# Patient Record
Sex: Female | Born: 1965 | Race: Black or African American | Hispanic: No | Marital: Single | Smoking: Current some day smoker
Health system: Southern US, Community
[De-identification: ages and names within clinical notes are randomized; demographics above are authoritative.]

## PROBLEM LIST (undated history)

## (undated) DIAGNOSIS — E109 Type 1 diabetes mellitus without complications: Secondary | ICD-10-CM

## (undated) DIAGNOSIS — N183 Chronic kidney disease, stage 3 unspecified: Secondary | ICD-10-CM

## (undated) DIAGNOSIS — I10 Essential (primary) hypertension: Secondary | ICD-10-CM

---

## 1998-01-10 ENCOUNTER — Encounter: Admission: RE | Admit: 1998-01-10 | Discharge: 1998-01-10 | Payer: Self-pay | Admitting: Internal Medicine

## 1998-02-13 ENCOUNTER — Encounter: Admission: RE | Admit: 1998-02-13 | Discharge: 1998-02-13 | Payer: Self-pay | Admitting: Internal Medicine

## 1999-07-23 ENCOUNTER — Other Ambulatory Visit: Admission: RE | Admit: 1999-07-23 | Discharge: 1999-07-23 | Payer: Self-pay | Admitting: Obstetrics and Gynecology

## 1999-10-16 ENCOUNTER — Encounter: Admission: RE | Admit: 1999-10-16 | Discharge: 2000-01-14 | Payer: Self-pay | Admitting: Obstetrics and Gynecology

## 1999-11-21 ENCOUNTER — Encounter: Admission: RE | Admit: 1999-11-21 | Discharge: 2000-02-19 | Payer: Self-pay | Admitting: Obstetrics and Gynecology

## 1999-12-27 ENCOUNTER — Inpatient Hospital Stay (HOSPITAL_COMMUNITY): Admission: AD | Admit: 1999-12-27 | Discharge: 1999-12-27 | Payer: Self-pay | Admitting: Obstetrics and Gynecology

## 2000-01-08 ENCOUNTER — Inpatient Hospital Stay (HOSPITAL_COMMUNITY): Admission: AD | Admit: 2000-01-08 | Discharge: 2000-01-11 | Payer: Self-pay | Admitting: Obstetrics and Gynecology

## 2000-01-08 ENCOUNTER — Encounter (INDEPENDENT_AMBULATORY_CARE_PROVIDER_SITE_OTHER): Payer: Self-pay

## 2000-02-13 ENCOUNTER — Other Ambulatory Visit: Admission: RE | Admit: 2000-02-13 | Discharge: 2000-02-13 | Payer: Self-pay | Admitting: Obstetrics and Gynecology

## 2006-09-21 ENCOUNTER — Inpatient Hospital Stay (HOSPITAL_COMMUNITY): Admission: AD | Admit: 2006-09-21 | Discharge: 2006-09-28 | Payer: Self-pay | Admitting: Internal Medicine

## 2006-09-21 ENCOUNTER — Emergency Department (HOSPITAL_COMMUNITY): Admission: EM | Admit: 2006-09-21 | Discharge: 2006-09-21 | Payer: Self-pay | Admitting: Family Medicine

## 2006-09-30 ENCOUNTER — Ambulatory Visit: Payer: Self-pay | Admitting: Internal Medicine

## 2006-10-01 ENCOUNTER — Ambulatory Visit: Payer: Self-pay | Admitting: *Deleted

## 2006-10-09 ENCOUNTER — Encounter: Admission: RE | Admit: 2006-10-09 | Discharge: 2007-01-07 | Payer: Self-pay | Admitting: *Deleted

## 2006-10-28 ENCOUNTER — Ambulatory Visit: Payer: Self-pay | Admitting: Internal Medicine

## 2006-11-27 ENCOUNTER — Ambulatory Visit: Payer: Self-pay | Admitting: Internal Medicine

## 2007-01-19 ENCOUNTER — Ambulatory Visit: Payer: Self-pay | Admitting: Internal Medicine

## 2007-06-26 ENCOUNTER — Ambulatory Visit (HOSPITAL_COMMUNITY): Admission: RE | Admit: 2007-06-26 | Discharge: 2007-06-26 | Payer: Self-pay | Admitting: Family Medicine

## 2007-07-24 ENCOUNTER — Ambulatory Visit: Payer: Self-pay | Admitting: Internal Medicine

## 2007-08-11 ENCOUNTER — Ambulatory Visit: Payer: Self-pay | Admitting: Internal Medicine

## 2008-05-31 ENCOUNTER — Ambulatory Visit: Payer: Self-pay | Admitting: Family Medicine

## 2008-05-31 ENCOUNTER — Encounter (INDEPENDENT_AMBULATORY_CARE_PROVIDER_SITE_OTHER): Payer: Self-pay | Admitting: Internal Medicine

## 2008-05-31 LAB — CONVERTED CEMR LAB
ALT: 28 units/L (ref 0–35)
Basophils Absolute: 0.1 10*3/uL (ref 0.0–0.1)
Basophils Relative: 2 % — ABNORMAL HIGH (ref 0–1)
CO2: 23 meq/L (ref 19–32)
Cholesterol: 181 mg/dL (ref 0–200)
Creatinine, Ser: 0.99 mg/dL (ref 0.40–1.20)
Eosinophils Relative: 2 % (ref 0–5)
HCT: 41.3 % (ref 36.0–46.0)
Hemoglobin: 12.9 g/dL (ref 12.0–15.0)
Lymphocytes Relative: 40 % (ref 12–46)
MCHC: 31.2 g/dL (ref 30.0–36.0)
MCV: 81.1 fL (ref 78.0–100.0)
Monocytes Absolute: 0.4 10*3/uL (ref 0.1–1.0)
RDW: 14.9 % (ref 11.5–15.5)
Total Bilirubin: 0.5 mg/dL (ref 0.3–1.2)
Total CHOL/HDL Ratio: 3.2
VLDL: 14 mg/dL (ref 0–40)

## 2008-06-03 ENCOUNTER — Ambulatory Visit: Payer: Self-pay | Admitting: Internal Medicine

## 2008-06-14 ENCOUNTER — Ambulatory Visit: Payer: Self-pay | Admitting: Internal Medicine

## 2008-06-27 ENCOUNTER — Ambulatory Visit (HOSPITAL_COMMUNITY): Admission: RE | Admit: 2008-06-27 | Discharge: 2008-06-27 | Payer: Self-pay | Admitting: Family Medicine

## 2008-06-28 ENCOUNTER — Ambulatory Visit: Payer: Self-pay | Admitting: Internal Medicine

## 2008-08-16 ENCOUNTER — Ambulatory Visit: Payer: Self-pay | Admitting: Internal Medicine

## 2008-10-31 ENCOUNTER — Ambulatory Visit: Payer: Self-pay | Admitting: Family Medicine

## 2008-11-30 ENCOUNTER — Ambulatory Visit: Payer: Self-pay | Admitting: Internal Medicine

## 2008-11-30 ENCOUNTER — Encounter (INDEPENDENT_AMBULATORY_CARE_PROVIDER_SITE_OTHER): Payer: Self-pay | Admitting: Family Medicine

## 2008-11-30 LAB — CONVERTED CEMR LAB
AST: 13 units/L (ref 0–37)
Albumin: 4.3 g/dL (ref 3.5–5.2)
Alkaline Phosphatase: 76 units/L (ref 39–117)
BUN: 15 mg/dL (ref 6–23)
Creatinine, Ser: 0.92 mg/dL (ref 0.40–1.20)
HDL: 47 mg/dL (ref >39)
LDL Cholesterol: 98 mg/dL (ref 0–99)
Microalb, Ur: 0.5 mg/dL (ref 0.00–1.89)
Potassium: 3.6 meq/L (ref 3.5–5.3)
Total Bilirubin: 0.3 mg/dL (ref 0.3–1.2)
Total CHOL/HDL Ratio: 3.3
VLDL: 10 mg/dL (ref 0–40)

## 2009-07-11 ENCOUNTER — Ambulatory Visit (HOSPITAL_COMMUNITY): Admission: RE | Admit: 2009-07-11 | Discharge: 2009-07-11 | Payer: Self-pay | Admitting: Family Medicine

## 2009-08-08 ENCOUNTER — Encounter (INDEPENDENT_AMBULATORY_CARE_PROVIDER_SITE_OTHER): Payer: Self-pay | Admitting: Adult Health

## 2009-08-08 ENCOUNTER — Ambulatory Visit: Payer: Self-pay | Admitting: Internal Medicine

## 2009-08-08 LAB — CONVERTED CEMR LAB
ALT: 16 units/L (ref 0–35)
AST: 12 units/L (ref 0–37)
Creatinine, Ser: 1.04 mg/dL (ref 0.40–1.20)
HDL: 43 mg/dL (ref >39)
Total Bilirubin: 0.3 mg/dL (ref 0.3–1.2)
Total CHOL/HDL Ratio: 3.7
VLDL: 21 mg/dL (ref 0–40)

## 2009-11-08 ENCOUNTER — Ambulatory Visit: Payer: Self-pay | Admitting: Internal Medicine

## 2010-07-12 ENCOUNTER — Ambulatory Visit (HOSPITAL_COMMUNITY): Admission: RE | Admit: 2010-07-12 | Discharge: 2010-07-12 | Payer: Self-pay | Admitting: Internal Medicine

## 2011-01-26 ENCOUNTER — Other Ambulatory Visit: Payer: Self-pay | Admitting: Internal Medicine

## 2011-02-01 NOTE — Discharge Summary (Signed)
Stephanie Huber, Stephanie Huber NO.:  [id number]   MEDICAL RECORD NO.:  [id number]          PATIENT TYPE:  INP   LOCATION:  5708                         FACILITY:  [facility]   PHYSICIAN:  Lonia Blood, M.D.DATE OF BIRTH:  1966/02/07   DATE OF ADMISSION:  09/21/2006  DATE OF DISCHARGE:  09/28/2006                         DISCHARGE SUMMARY - REFERRING   PRIMARY CARE PHYSICIAN:  [facility].   DISCHARGE DIAGNOSES:  1. Newly diagnosed diabetes mellitus type 2.      a.     Presenting in diabetic ketoacidosis.      b.     Extensive diabetic education initiated.      c.     Initiation of medical therapy begun.  2. Hypertension.   DISCHARGE MEDICATIONS:  1. Lisinopril 5 mg daily.  2. Glyburide 5 mg b.i.d.  3. Glucophage 500 mg b.i.d.  4. Lantus insulin 30 units q.h.s.  5. Aspirin 81 mg daily.   FOLLOWUP:  The patient will followup at her scheduled appointment at  [facility] on September 30, 2006 at 12:00 noon. The absolute necessity  of frequent followup appointments has been impressed upon Ms. Luisa Hart.  AT that time, the patient's CBG log will need to be checked and  consideration will need to be made to further titrating her diabetes  medications.   CONSULTATIONS:  None.   PROCEDURE:  None.   HISTORY OF PRESENT ILLNESS:  For details concerning the patient's  presentation, please see dictated history and physical by Dr. Hannah Beat dated September 21, 2006.   Stephanie Huber is a pleasant 45 year old female with no significant  prior medical history, short of hypertension, who presented to the  hospital on September 21, 2006 with complaints of persistent nausea and  vomiting. Emergency room evaluation revealed that the patient was  suffering with diabetic ketoacidosis with a bicarbonate level of 7. She  was placed on a Glucommander protocol and admitted to the acute unit at  [facility]. With insulin drip via the Glucommander protocol,  the pateint's DKA  resolved. The extensive process of diabetic education  was then initiated. Diet counseling was carried out. Explantation of the  pathogenesis of diabetes and the need for strict control was impressed  upon the patient. Extensive education was carried out to ensure the  patient was competent to administer herself insulin and check her CBG's.  Case management consultation was carried out to assure that the patient  could obtain her medications and would have reliable outpatient  followup. At such time the patient's CBG's became more stable and it was  felt that the patient was adequately educated, she was cleared to  discharge home. Followup issues are as noted above.   No other significant complications were encountered during the  hospitalization. The patient did have a self-limited episode of diarrhea  but tested negative for C. diff colitis. She also had significant  hypophosphatemia associated with her DKA but as would be expected, this  resolved with resolution of her DKA. It should be noted that a  urinalysis obtained at the day of the patient's presentation did reveal  large blood and greater than 300 protein. Urine microscopy and micro was  not obtained, so it is not clear if this was actually myoglobin or  hemoglobin. At the time of discharge, there was no difficulty with gross  hematuria. Followup outpatient urinalysils is recommended.      Lonia Blood, M.D.  Electronically Signed     JTM/MEDQ  D:  09/27/2006  T:  09/27/2006  Job:  [id number]   cc:   [facility]

## 2011-02-01 NOTE — H&P (Signed)
NAMEVALLORY, OETKEN NO.:  [id number]   MEDICAL RECORD NO.:  [id number]          PATIENT TYPE:  INP   LOCATION:  3311                         FACILITY:  [facility]   PHYSICIAN:  Hettie Holstein, D.O.    DATE OF BIRTH:  1966/03/28   DATE OF ADMISSION:  09/21/2006  DATE OF DISCHARGE:                              HISTORY & PHYSICAL   PRIMARY CARE PHYSICIAN:  Unassigned.   CHIEF COMPLAINT:  Persistent nausea and vomiting.   HISTORY OF PRESENT ILLNESS:  Stephanie Huber is a pleasant 45 year old  African-American female with no known history of diabetes, who had been  in her usual state of health up until the day after her Christmas, when  she started developing vomiting, increased thirst and polyuria.  She  stated that she tried to wait this out by increasing her fluid intake,  but this had progressed.  Ultimately, she presented to the urgent care  center.  At which time, she was discovered to be quite acidotic with a  pH of 7.18 venous and a bicarb of 7.3.  She was tachycardic, tachypneic  and had exhibited some mental status changes as per staff.  In any  event, she was admitted directly to the stepdown unit from the urgent  care center.  At which time, she was probably initiated on IV fluid  boluses.  She had received almost a liter at the urgent care center and  at present, she is initiating lab work and [employer].   PAST MEDICAL HISTORY:  Significant for hypertension. She has no known  surgical history.   MEDICATIONS:  She takes potassium supplements and HCTZ at home as well  as Depo shots.   SOCIAL HISTORY:  She denies alcohol or tobacco.  She is married with one  49-year-old child.  She works in [employer].   FAMILY HISTORY:  Her mother is alive at age 62.  No known medical  illnesses that she is aware of.  Her father, she can provide his  history.   REVIEW OF SYSTEMS:  She had been in her usual state of health up until  around Christmas, when she  developed some nausea, vomiting, polyuria,  polydipsia.  She denies any hematemesis or hematochezia.  She denies  fevers or chills.  She denies chest pain or shortness of breath.  She  denies vaginal discharge.  In addition, she denies any lower extremity  pain or edema.  Further review of systems is unremarkable.   Prior to arrival, her labs were as noted above.  Creatinine of 0.9.  The  I-STAT revealed pH venous of 7.187, PCO2 of 19, bicarb of 7.3.  Urinalysis revealed protein as well as small number of leukocytes and  some blood.  Her urine pH was 5.5 and was quite concentrated with a  specific gravity of 1.03.  Her CBC revealed WBC of 12.8, hemoglobin of  14.2 and a platelet count of 335 as well as an MCV of 80.2.   ASSESSMENT:  1. Diabetic ketoacidosis.  2. Newly diagnosed diabetes.  3. History of hypertension on antihypertensive medications at  baseline.  4. Leukocytosis.  5. Gross hematuria.   PLAN:  At this time, we are going to admit Stephanie Huber to the stepdown  unit.  Initiate generous volume repletion and D5 normal saline and  Glucommander until anion gap closure.  We will follow her clinical  course and ultimately titrate off D5 normal saline once her gap closes  and determine a basal insulin requirement for her and initiate this with  Lantus and diabetes education. We will follow her clinical course.  We  will check a urinalysis as well to assure that she does not have an  underlying urinary tract infection and get a baseline EKG as well as  chest x-ray.      Hettie Holstein, D.O.  Electronically Signed     ESS/MEDQ  D:  09/21/2006  T:  09/22/2006  Job:  [id number]   cc:   Elvina Sidle, M.D.

## 2011-04-23 ENCOUNTER — Other Ambulatory Visit (HOSPITAL_COMMUNITY): Payer: Self-pay | Admitting: Internal Medicine

## 2011-04-23 DIAGNOSIS — Z1231 Encounter for screening mammogram for malignant neoplasm of breast: Secondary | ICD-10-CM

## 2011-07-15 ENCOUNTER — Ambulatory Visit (HOSPITAL_COMMUNITY): Payer: Medicare Other

## 2011-08-09 ENCOUNTER — Ambulatory Visit (HOSPITAL_COMMUNITY)
Admission: RE | Admit: 2011-08-09 | Discharge: 2011-08-09 | Disposition: A | Discharge status: Home / Self Care | Payer: Medicare Other | Source: Ambulatory Visit | Attending: Internal Medicine | Admitting: Internal Medicine

## 2011-08-09 DIAGNOSIS — Z1231 Encounter for screening mammogram for malignant neoplasm of breast: Secondary | ICD-10-CM | POA: Insufficient documentation

## 2012-06-08 ENCOUNTER — Other Ambulatory Visit (HOSPITAL_COMMUNITY): Payer: Self-pay | Admitting: Internal Medicine

## 2012-06-08 DIAGNOSIS — Z1231 Encounter for screening mammogram for malignant neoplasm of breast: Secondary | ICD-10-CM

## 2012-08-10 ENCOUNTER — Ambulatory Visit (HOSPITAL_COMMUNITY)
Admission: RE | Admit: 2012-08-10 | Discharge: 2012-08-10 | Disposition: A | Discharge status: Home / Self Care | Payer: Medicare Other | Source: Ambulatory Visit | Attending: Internal Medicine | Admitting: Internal Medicine

## 2012-08-10 DIAGNOSIS — Z1231 Encounter for screening mammogram for malignant neoplasm of breast: Secondary | ICD-10-CM | POA: Insufficient documentation

## 2013-07-06 ENCOUNTER — Other Ambulatory Visit (HOSPITAL_COMMUNITY): Payer: Self-pay | Admitting: Internal Medicine

## 2013-07-06 DIAGNOSIS — Z1231 Encounter for screening mammogram for malignant neoplasm of breast: Secondary | ICD-10-CM

## 2013-08-11 ENCOUNTER — Ambulatory Visit (HOSPITAL_COMMUNITY)
Admission: RE | Admit: 2013-08-11 | Discharge: 2013-08-11 | Disposition: A | Discharge status: Home / Self Care | Payer: Medicare Other | Source: Ambulatory Visit | Attending: Internal Medicine | Admitting: Internal Medicine

## 2013-08-11 DIAGNOSIS — Z1231 Encounter for screening mammogram for malignant neoplasm of breast: Secondary | ICD-10-CM

## 2014-04-27 ENCOUNTER — Emergency Department (INDEPENDENT_AMBULATORY_CARE_PROVIDER_SITE_OTHER)
Admission: EM | Admit: 2014-04-27 | Discharge: 2014-04-27 | Disposition: A | Discharge status: Home / Self Care | Payer: Medicare Other | Source: Home / Self Care | Attending: Family Medicine | Admitting: Family Medicine

## 2014-04-27 ENCOUNTER — Encounter (HOSPITAL_COMMUNITY): Payer: Self-pay | Admitting: Emergency Medicine

## 2014-04-27 DIAGNOSIS — R000 Tachycardia, unspecified: Secondary | ICD-10-CM

## 2014-04-27 NOTE — ED Notes (Signed)
Pt  [facility]  [facility]  [facility]  [facility]  That   Her pulse  Was  Elevated    And  That  She  Needed  To  Be  evaulated     Pt  denys      Any  Physical      Symptoms    Or  Any       Complaints         She  Is  Alert  And  Oriented  And  Appears  In no  Acute  Distress

## 2014-04-27 NOTE — ED Provider Notes (Signed)
Aldean JewettKeeshia D Barsch is a 48 y.o. female who presents to Urgent Care today for tachycardia. Patient was found to have an elevated heart rate of 109 beats per minute at the plasma center today while attempting to donate. She was advised to present to urgent care for evaluation. She feels completely well with no chest pain palpitations shortness of breath weakness or numbness. She denies plasma twice per week and drinks plenty of fluids. She feels well otherwise.   History reviewed. No pertinent past medical history. History  Substance Use Topics  . Smoking status: Never Smoker   . Smokeless tobacco: Not on file  . Alcohol Use: No   ROS as above Medications: No current facility-administered medications for this encounter.   No current outpatient prescriptions on file.    Exam:  BP 145/88  Pulse 97  Temp(Src) 98.3 F (36.8 C) (Oral)  Resp 18  SpO2 100%  LMP 03/27/2014 Gen: Well NAD HEENT: EOMI,  MMM Lungs: Normal work of breathing. CTABL Heart: RRR no MRG Abd: NABS, Soft. Nondistended, Nontender Exts: Brisk capillary refill, warm and well perfused.   No results found for this or any previous visit (from the past 24 hour(s)). No results found.  Assessment and Plan: 48 y.o. female with resolved tachycardia. Patient appears to be well. Recommend followup with primary care provider. Come back as needed.  Discussed warning signs or symptoms. Please see discharge instructions. Patient expresses understanding.   This note was created using Conservation officer, historic buildingsDragon voice recognition software. Any transcription errors are unintended.    Rodolph BongEvan S Corey, MD 04/27/14 [phone]

## 2014-04-27 NOTE — Discharge Instructions (Signed)
Thank you for coming in today. Followup with your Dr..  Drink plenty of fluids Come back as needed Call or go to the emergency room if you get worse, have trouble breathing, have chest pains, or palpitations.   Nonspecific Tachycardia Tachycardia is a faster than normal heartbeat (more than 100 beats per minute). In adults, the heart normally beats between 60 and 100 times a minute. A fast heartbeat may be a normal response to exercise or stress. It does not necessarily mean that something is wrong. However, sometimes when your heart beats too fast it may not be able to pump enough blood to the rest of your body. This can result in chest pain, shortness of breath, dizziness, and even fainting. Nonspecific tachycardia means that the specific cause or pattern of your tachycardia is unknown. CAUSES  Tachycardia may be harmless or it may be due to a more serious underlying cause. Possible causes of tachycardia include:  Exercise or exertion.  Fever.  Pain or injury.  Infection.  Loss of body fluids (dehydration).  Overactive thyroid.  Lack of red blood cells (anemia).  Anxiety and stress.  Alcohol.  Caffeine.  Tobacco products.  Diet pills.  Illegal drugs.  Heart disease. SYMPTOMS  Rapid or irregular heartbeat (palpitations).  Suddenly feeling your heart beating (cardiac awareness).  Dizziness.  Tiredness (fatigue).  Shortness of breath.  Chest pain.  Nausea.  Fainting. DIAGNOSIS  Your caregiver will perform a physical exam and take your medical history. In some cases, a heart specialist (cardiologist) may be consulted. Your caregiver may also order:  Blood tests.  Electrocardiography. This test records the electrical activity of your heart.  A heart monitoring test. TREATMENT  Treatment will depend on the likely cause of your tachycardia. The goal is to treat the underlying cause of your tachycardia. Treatment methods may include:  Replacement of fluids  or blood through an intravenous (IV) tube for moderate to severe dehydration or anemia.  New medicines or changes in your current medicines.  Diet and lifestyle changes.  Treatment for certain infections.  Stress relief or relaxation methods. HOME CARE INSTRUCTIONS   Rest.  Drink enough fluids to keep your urine clear or pale yellow.  Do not smoke.  Avoid:  Caffeine.  Tobacco.  Alcohol.  Chocolate.  Stimulants such as over-the-counter diet pills or pills that help you stay awake.  Situations that cause anxiety or stress.  Illegal drugs such as marijuana, phencyclidine (PCP), and cocaine.  Only take medicine as directed by your caregiver.  Keep all follow-up appointments as directed by your caregiver. SEEK IMMEDIATE MEDICAL CARE IF:   You have pain in your chest, upper arms, jaw, or neck.  You become weak, dizzy, or feel faint.  You have palpitations that will not go away.  You vomit, have diarrhea, or pass blood in your stool.  Your skin is cool, pale, and wet.  You have a fever that will not go away with rest, fluids, and medicine. MAKE SURE YOU:   Understand these instructions.  Will watch your condition.  Will get help right away if you are not doing well or get worse. Document Released: 10/10/2004 Document Revised: 11/25/2011 Document Reviewed: 08/13/2011 [facility] Patient Information 2015 [place], [place]. This information is not intended to replace advice given to you by your health care provider. Make sure you discuss any questions you have with your health care provider.

## 2014-06-21 ENCOUNTER — Other Ambulatory Visit (HOSPITAL_COMMUNITY): Payer: Self-pay | Admitting: Internal Medicine

## 2014-06-21 DIAGNOSIS — Z1231 Encounter for screening mammogram for malignant neoplasm of breast: Secondary | ICD-10-CM

## 2014-08-16 ENCOUNTER — Ambulatory Visit (HOSPITAL_COMMUNITY)
Admission: RE | Admit: 2014-08-16 | Discharge: 2014-08-16 | Disposition: A | Discharge status: Home / Self Care | Payer: Medicare Other | Source: Ambulatory Visit | Attending: Internal Medicine | Admitting: Internal Medicine

## 2014-08-16 DIAGNOSIS — Z1231 Encounter for screening mammogram for malignant neoplasm of breast: Secondary | ICD-10-CM

## 2015-02-18 ENCOUNTER — Encounter (HOSPITAL_COMMUNITY): Payer: Self-pay | Admitting: *Deleted

## 2015-02-18 ENCOUNTER — Inpatient Hospital Stay (HOSPITAL_COMMUNITY): Payer: Medicare Other

## 2015-02-18 ENCOUNTER — Inpatient Hospital Stay (HOSPITAL_COMMUNITY)
Admission: EM | Admit: 2015-02-18 | Discharge: 2015-02-21 | DRG: 638 | Disposition: A | Discharge status: Home / Self Care | Payer: Medicare Other | Attending: Internal Medicine | Admitting: Internal Medicine

## 2015-02-18 DIAGNOSIS — E1110 Type 2 diabetes mellitus with ketoacidosis without coma: Secondary | ICD-10-CM | POA: Diagnosis present

## 2015-02-18 DIAGNOSIS — E872 Acidosis, unspecified: Secondary | ICD-10-CM | POA: Diagnosis present

## 2015-02-18 DIAGNOSIS — N390 Urinary tract infection, site not specified: Secondary | ICD-10-CM

## 2015-02-18 DIAGNOSIS — E871 Hypo-osmolality and hyponatremia: Secondary | ICD-10-CM | POA: Diagnosis present

## 2015-02-18 DIAGNOSIS — E860 Dehydration: Secondary | ICD-10-CM | POA: Diagnosis present

## 2015-02-18 DIAGNOSIS — E861 Hypovolemia: Secondary | ICD-10-CM | POA: Diagnosis present

## 2015-02-18 DIAGNOSIS — Z794 Long term (current) use of insulin: Secondary | ICD-10-CM | POA: Diagnosis not present

## 2015-02-18 DIAGNOSIS — N179 Acute kidney failure, unspecified: Secondary | ICD-10-CM | POA: Diagnosis present

## 2015-02-18 DIAGNOSIS — Z9119 Patient's noncompliance with other medical treatment and regimen: Secondary | ICD-10-CM | POA: Diagnosis present

## 2015-02-18 DIAGNOSIS — E1310 Other specified diabetes mellitus with ketoacidosis without coma: Secondary | ICD-10-CM | POA: Diagnosis not present

## 2015-02-18 DIAGNOSIS — E1010 Type 1 diabetes mellitus with ketoacidosis without coma: Secondary | ICD-10-CM | POA: Diagnosis not present

## 2015-02-18 DIAGNOSIS — E875 Hyperkalemia: Secondary | ICD-10-CM | POA: Diagnosis present

## 2015-02-18 DIAGNOSIS — B3749 Other urogenital candidiasis: Secondary | ICD-10-CM | POA: Diagnosis present

## 2015-02-18 DIAGNOSIS — R4182 Altered mental status, unspecified: Secondary | ICD-10-CM | POA: Diagnosis not present

## 2015-02-18 LAB — BASIC METABOLIC PANEL
ANION GAP: 7 (ref 5–15)
Anion gap: 17 — ABNORMAL HIGH (ref 5–15)
Anion gap: 11 (ref 5–15)
BUN: 51 mg/dL — AB (ref 6–20)
BUN: 48 mg/dL — ABNORMAL HIGH (ref 6–20)
BUN: 42 mg/dL — AB (ref 6–20)
CALCIUM: 8.2 mg/dL — AB (ref 8.9–10.3)
CALCIUM: 8.5 mg/dL — AB (ref 8.9–10.3)
CHLORIDE: 101 mmol/L (ref 101–111)
CO2: 20 mmol/L — ABNORMAL LOW (ref 22–32)
CO2: 25 mmol/L (ref 22–32)
CO2: 25 mmol/L (ref 22–32)
CREATININE: 3.77 mg/dL — AB (ref 0.44–1.00)
Calcium: 8.4 mg/dL — ABNORMAL LOW (ref 8.9–10.3)
Chloride: 96 mmol/L — ABNORMAL LOW (ref 101–111)
Chloride: 100 mmol/L — ABNORMAL LOW (ref 101–111)
Creatinine, Ser: 3.27 mg/dL — ABNORMAL HIGH (ref 0.44–1.00)
Creatinine, Ser: 3.03 mg/dL — ABNORMAL HIGH (ref 0.44–1.00)
GFR calc non Af Amer: 17 mL/min — ABNORMAL LOW (ref >60)
GFR, EST AFRICAN AMERICAN: 15 mL/min — AB (ref >60)
GFR, EST AFRICAN AMERICAN: 18 mL/min — AB (ref >60)
GFR, EST AFRICAN AMERICAN: 20 mL/min — AB (ref >60)
GFR, EST NON AFRICAN AMERICAN: 13 mL/min — AB (ref >60)
GFR, EST NON AFRICAN AMERICAN: 16 mL/min — AB (ref >60)
GLUCOSE: 206 mg/dL — AB (ref 65–99)
GLUCOSE: 132 mg/dL — AB (ref 65–99)
Glucose, Bld: 409 mg/dL — ABNORMAL HIGH (ref 65–99)
POTASSIUM: 3.8 mmol/L (ref 3.5–5.1)
POTASSIUM: 3.8 mmol/L (ref 3.5–5.1)
Potassium: 3.3 mmol/L — ABNORMAL LOW (ref 3.5–5.1)
SODIUM: 136 mmol/L (ref 135–145)
Sodium: 133 mmol/L — ABNORMAL LOW (ref 135–145)
Sodium: 133 mmol/L — ABNORMAL LOW (ref 135–145)

## 2015-02-18 LAB — COMPREHENSIVE METABOLIC PANEL
ALBUMIN: 3.8 g/dL (ref 3.5–5.0)
ALK PHOS: 122 U/L (ref 38–126)
ALT: 21 U/L (ref 14–54)
AST: 14 U/L — ABNORMAL LOW (ref 15–41)
Anion gap: 31 — ABNORMAL HIGH (ref 5–15)
BUN: 65 mg/dL — AB (ref 6–20)
CALCIUM: 9 mg/dL (ref 8.9–10.3)
CO2: 12 mmol/L — AB (ref 22–32)
CREATININE: 5.33 mg/dL — AB (ref 0.44–1.00)
Chloride: 73 mmol/L — ABNORMAL LOW (ref 101–111)
GFR calc Af Amer: 10 mL/min — ABNORMAL LOW (ref >60)
GFR, EST NON AFRICAN AMERICAN: 9 mL/min — AB (ref >60)
GLUCOSE: 1089 mg/dL — AB (ref 65–99)
Potassium: 5.6 mmol/L — ABNORMAL HIGH (ref 3.5–5.1)
Sodium: 116 mmol/L — CL (ref 135–145)
TOTAL PROTEIN: 8.3 g/dL — AB (ref 6.5–8.1)
Total Bilirubin: 1.4 mg/dL — ABNORMAL HIGH (ref 0.3–1.2)

## 2015-02-18 LAB — BLOOD GAS, ARTERIAL
Acid-base deficit: 15 mmol/L — ABNORMAL HIGH (ref 0.0–2.0)
Bicarbonate: 10.3 mEq/L — ABNORMAL LOW (ref 20.0–24.0)
Drawn by: 103701
FIO2: 0.21 %
O2 Saturation: 95.9 %
PH ART: 7.269 — AB (ref 7.350–7.450)
PO2 ART: 98.8 mmHg (ref 80.0–100.0)
Patient temperature: 98.6
TCO2: 9.7 mmol/L (ref 0–100)
pCO2 arterial: 23.2 mmHg — ABNORMAL LOW (ref 35.0–45.0)

## 2015-02-18 LAB — I-STAT CHEM 8, ED
BUN: 54 mg/dL — ABNORMAL HIGH (ref 6–20)
CREATININE: 5 mg/dL — AB (ref 0.44–1.00)
Calcium, Ion: 1.05 mmol/L — ABNORMAL LOW (ref 1.12–1.23)
Chloride: 83 mmol/L — ABNORMAL LOW (ref 101–111)
Glucose, Bld: >700 mg/dL (ref 65–99)
HEMATOCRIT: 49 % — AB (ref 36.0–46.0)
HEMOGLOBIN: 16.7 g/dL — AB (ref 12.0–15.0)
POTASSIUM: 5.6 mmol/L — AB (ref 3.5–5.1)
SODIUM: 114 mmol/L — AB (ref 135–145)
TCO2: 11 mmol/L (ref 0–100)

## 2015-02-18 LAB — CBC WITH DIFFERENTIAL/PLATELET
BASOS ABS: 0.1 10*3/uL (ref 0.0–0.1)
BASOS PCT: 1 % (ref 0–1)
Eosinophils Absolute: 0 10*3/uL (ref 0.0–0.7)
Eosinophils Relative: 0 % (ref 0–5)
HCT: 33.7 % — ABNORMAL LOW (ref 36.0–46.0)
Hemoglobin: 11.3 g/dL — ABNORMAL LOW (ref 12.0–15.0)
Lymphocytes Relative: 11 % — ABNORMAL LOW (ref 12–46)
Lymphs Abs: 0.8 10*3/uL (ref 0.7–4.0)
MCH: 26.1 pg (ref 26.0–34.0)
MCHC: 33.5 g/dL (ref 30.0–36.0)
MCV: 77.8 fL — AB (ref 78.0–100.0)
MONOS PCT: 11 % (ref 3–12)
Monocytes Absolute: 0.9 10*3/uL (ref 0.1–1.0)
NEUTROS ABS: 6 10*3/uL (ref 1.7–7.7)
NEUTROS PCT: 77 % (ref 43–77)
Platelets: 249 10*3/uL (ref 150–400)
RBC: 4.33 MIL/uL (ref 3.87–5.11)
RDW: 12.2 % (ref 11.5–15.5)
WBC: 7.8 10*3/uL (ref 4.0–10.5)

## 2015-02-18 LAB — URINALYSIS, ROUTINE W REFLEX MICROSCOPIC
Glucose, UA: >1000 mg/dL — AB
Ketones, ur: 15 mg/dL — AB
Nitrite: NEGATIVE
PROTEIN: 30 mg/dL — AB
Specific Gravity, Urine: 1.024 (ref 1.005–1.030)
Urobilinogen, UA: 0.2 mg/dL (ref 0.0–1.0)
pH: 5 (ref 5.0–8.0)

## 2015-02-18 LAB — CBG MONITORING, ED
Glucose-Capillary: >600 mg/dL (ref 65–99)
Glucose-Capillary: >600 mg/dL (ref 65–99)
Glucose-Capillary: >600 mg/dL (ref 65–99)

## 2015-02-18 LAB — URINE MICROSCOPIC-ADD ON

## 2015-02-18 LAB — POC URINE PREG, ED: Preg Test, Ur: NEGATIVE

## 2015-02-18 LAB — LACTIC ACID, PLASMA: LACTIC ACID, VENOUS: 1.5 mmol/L (ref 0.5–2.0)

## 2015-02-18 MED ORDER — DEXTROSE 5 % IV SOLN
1.0000 g | INTRAVENOUS | Status: DC
  Administered 2015-02-18 – 2015-02-20 (×3): 1 g via INTRAVENOUS
  Filled 2015-02-18 (×5): qty 10

## 2015-02-18 MED ORDER — DEXTROSE-NACL 5-0.45 % IV SOLN
INTRAVENOUS | Status: DC
  Administered 2015-02-18: 22:00:00 via INTRAVENOUS

## 2015-02-18 MED ORDER — INSULIN REGULAR HUMAN 100 UNIT/ML IJ SOLN
INTRAMUSCULAR | Status: DC
  Administered 2015-02-18: 5.4 [IU]/h via INTRAVENOUS
  Filled 2015-02-18: qty 2.5

## 2015-02-18 MED ORDER — SODIUM CHLORIDE 0.9 % IV SOLN: INTRAVENOUS | Status: DC

## 2015-02-18 MED ORDER — SODIUM CHLORIDE 0.9 % IV SOLN
INTRAVENOUS | Status: DC
  Filled 2015-02-18: qty 2.5

## 2015-02-18 MED ORDER — HEPARIN SODIUM (PORCINE) 5000 UNIT/ML IJ SOLN
5000.0000 [IU] | Freq: Three times a day (TID) | INTRAMUSCULAR | Status: DC
  Administered 2015-02-18 – 2015-02-21 (×7): 5000 [IU] via SUBCUTANEOUS
  Filled 2015-02-18 (×12): qty 1

## 2015-02-18 MED ORDER — CEFTRIAXONE SODIUM 1 G IJ SOLR: 1.0000 g | INTRAMUSCULAR | Status: DC

## 2015-02-18 MED ORDER — ONDANSETRON HCL 4 MG/2ML IJ SOLN: 4.0000 mg | Freq: Three times a day (TID) | INTRAMUSCULAR | Status: DC | PRN

## 2015-02-18 MED ORDER — SODIUM CHLORIDE 0.9 % IV SOLN
INTRAVENOUS | Status: AC
  Administered 2015-02-18: 15:00:00 via INTRAVENOUS

## 2015-02-18 MED ORDER — DEXTROSE-NACL 5-0.45 % IV SOLN: INTRAVENOUS | Status: DC

## 2015-02-18 MED ORDER — SODIUM CHLORIDE 0.9 % IV SOLN: 1000.0000 mL | INTRAVENOUS | Status: DC

## 2015-02-18 MED ORDER — SODIUM CHLORIDE 0.9 % IV SOLN
INTRAVENOUS | Status: DC
  Administered 2015-02-18: 150 mL/h via INTRAVENOUS

## 2015-02-18 MED ORDER — SODIUM CHLORIDE 0.9 % IV SOLN
1000.0000 mL | Freq: Once | INTRAVENOUS | Status: AC
  Administered 2015-02-18: 1000 mL via INTRAVENOUS

## 2015-02-18 MED ORDER — SODIUM CHLORIDE 0.9 % IV BOLUS (SEPSIS)
500.0000 mL | Freq: Once | INTRAVENOUS | Status: AC
  Administered 2015-02-18: 500 mL via INTRAVENOUS

## 2015-02-18 NOTE — ED Notes (Addendum)
She has had two liters of IVF in ED and is on a 3rd liter infusing at 16375m./hour.  She is in no distress.  I have attempted to call ICU report--they are to call me back shortly.

## 2015-02-18 NOTE — ED Notes (Signed)
EMS called "to come get this girl". Upon arrival the patient had a cbg >599. Patient complains of feeling weak for about 2 weeks. Patient is diabetic, but does not check blood sugars. She states that she takes insulin and metformin.

## 2015-02-18 NOTE — ED Notes (Signed)
Dr Hyacinth MeekerMiller aware of abnormal glucose.

## 2015-02-18 NOTE — ED Provider Notes (Signed)
CSN: [id number]     Arrival date & time 02/18/15  1022 History   First MD Initiated Contact with Patient 02/18/15 1026     Chief Complaint  Patient presents with  . Hyperglycemia     (Consider location/radiation/quality/duration/timing/severity/associated sxs/prior Treatment) HPI Comments: The patient is a [age] year old female, paramedics were called to the residence because of a very high blood sugar, the significant other felt that this patient had significant decrease in mental status, has not been able to get up for approximately 5 days other than to use the bathroom, has had urinary frequency and increased thirst. The patient was awake and alert upon their arrival but hypotensive with blood pressure in the 60 systolic range. She is a known diabetic, she endorses being out of her insulin for approximately 2 weeks, she still takes her metformin. She denies dysuria diarrhea or rashes coughing shortness of breath headache or blurred vision. She has severe generalized fatigue.  Blood sugar prior to arrival was too high to read  Patient is a 49 y.o. female presenting with hyperglycemia. The history is provided by the patient.  Hyperglycemia   Past Medical History  Diagnosis Date  . Diabetes mellitus without complication    History reviewed. No pertinent past surgical history. History reviewed. No pertinent family history. History  Substance Use Topics  . Smoking status: Never Smoker   . Smokeless tobacco: Not on file  . Alcohol Use: No   OB History    No data available     Review of Systems  All other systems reviewed and are negative.     Allergies  Review of patient's allergies indicates no known allergies.  Home Medications   Prior to Admission medications   Not on File   BP 110/72 mmHg  Pulse 112  Temp(Src) 97.6 F (36.4 C) (Oral)  Resp 20  Ht 5\' 8"  (1.727 m)  Wt 140 lb (63.504 kg)  BMI 21.29 kg/m2  SpO2 100% Physical Exam  Constitutional: She appears  well-developed and well-nourished. No distress.  HENT:  Head: Normocephalic and atraumatic.  Mouth/Throat: No oropharyngeal exudate.  Dry MM  Eyes: Conjunctivae and EOM are normal. Pupils are equal, round, and reactive to light. Right eye exhibits no discharge. Left eye exhibits no discharge. No scleral icterus.  Neck: Normal range of motion. Neck supple. No JVD present. No thyromegaly present.  Cardiovascular: Regular rhythm, normal heart sounds and intact distal pulses.  Exam reveals no gallop and no friction rub.   No murmur heard. tachycardia  Pulmonary/Chest: Effort normal and breath sounds normal. No respiratory distress. She has no wheezes. She has no rales.  Abdominal: Soft. Bowel sounds are normal. She exhibits no distension and no mass. There is no tenderness.  Musculoskeletal: Normal range of motion. She exhibits no edema or tenderness.  Lymphadenopathy:    She has no cervical adenopathy.  Neurological: She is alert. Coordination normal.  Generalized weakness, no focal weakness, follows commands without difficulty, able to sit up in bed with some discomfort  Skin: Skin is warm and dry. No rash noted. No erythema.  Psychiatric: She has a normal mood and affect. Her behavior is normal.  Nursing note and vitals reviewed.   ED Course  Procedures (including critical care time) Labs Review Labs Reviewed  BLOOD GAS, ARTERIAL - Abnormal; Notable for the following:    pH, Arterial 7.269 (*)    pCO2 arterial 23.2 (*)    Bicarbonate 10.3 (*)    Acid-base deficit 15.0 (*)  All other components within normal limits  URINALYSIS, ROUTINE W REFLEX MICROSCOPIC (NOT AT [facility]) - Abnormal; Notable for the following:    APPearance CLOUDY (*)    Glucose, UA >1000 (*)    Hgb urine dipstick TRACE (*)    Bilirubin Urine MODERATE (*)    Ketones, ur 15 (*)    Protein, ur 30 (*)    Leukocytes, UA SMALL (*)    All other components within normal limits  COMPREHENSIVE METABOLIC PANEL -  Abnormal; Notable for the following:    Sodium 116 (*)    Potassium 5.6 (*)    Chloride 73 (*)    CO2 12 (*)    BUN 65 (*)    Creatinine, Ser 5.33 (*)    Total Protein 8.3 (*)    AST 14 (*)    Total Bilirubin 1.4 (*)    GFR calc non Af Amer 9 (*)    GFR calc Af Amer 10 (*)    Anion gap 31 (*)    All other components within normal limits  URINE MICROSCOPIC-ADD ON - Abnormal; Notable for the following:    Squamous Epithelial / LPF MANY (*)    Bacteria, UA MANY (*)    Casts GRANULAR CAST (*)    All other components within normal limits  CBG MONITORING, ED - Abnormal; Notable for the following:    Glucose-Capillary >600 (*)    All other components within normal limits  I-STAT CHEM 8, ED - Abnormal; Notable for the following:    Sodium 114 (*)    Potassium 5.6 (*)    Chloride 83 (*)    BUN 54 (*)    Creatinine, Ser 5.00 (*)    Glucose, Bld >700 (*)    Calcium, Ion 1.05 (*)    Hemoglobin 16.7 (*)    HCT 49.0 (*)    All other components within normal limits  CBC WITH DIFFERENTIAL/PLATELET  POC URINE PREG, ED    Imaging Review No results found.    MDM   Final diagnoses:  Diabetic ketoacidosis without coma associated with type 1 diabetes mellitus  Acute renal failure, unspecified acute renal failure type  UTI (lower urinary tract infection)  Hyponatremia    Vital signs reveal tachycardia, the patient's fluid status is likely hypovolemic, I anticipate that the patient is in DKA or at least severely hyperglycemic and will need fluids and insulin. She is no longer hypotensive, last blood pressure is 103/65, no fever or signs of infection, suspect medication noncompliance is the source of the patient's severe hyperglycemia.  Multiple lab abnormalities of significant concern  #1 severe metabolic acidosis with severe hyperglycemia and an increased anion gap  #2 hyponatremia 116  #3 acute renal failure creatinine over 5  Number for hyperglycemia, undetectable high.  The  patient has severe hyperglycemia, she is in diabetic ketoacidosis, she thankfully still has mental status which appears to be normal though she is still tachycardic and borderline hypotensive. She will need at least stepdown unit, this was discussed with the hospitalist who is in agreement, insulin drip has been started. Critical care provided  CRITICAL CARE Performed by: Vida Roller Total critical care time: 35 Critical care time was exclusive of separately billable procedures and treating other patients. Critical care was necessary to treat or prevent imminent or life-threatening deterioration. Critical care was time spent personally by me on the following activities: development of treatment plan with patient and/or surrogate as well as nursing, discussions with consultants, evaluation of patient's  response to treatment, examination of patient, obtaining history from patient or surrogate, ordering and performing treatments and interventions, ordering and review of laboratory studies, ordering and review of radiographic studies, pulse oximetry and re-evaluation of patient's condition.   Eber Hong, MD 02/18/15 1150

## 2015-02-18 NOTE — ED Notes (Signed)
I have just given report to ginger, RN in ICU.  She states rm. [id number] is being cleaned and that is where they will place pt.  They will call as soon as this room is ready; as environmental services are cleaning it as I write this.

## 2015-02-18 NOTE — H&P (Signed)
History and Physical        Hospital Admission Note Date: 02/18/2015  Patient name: Stephanie Huber Medical record number: [id number] Date of birth: [birth year] Age: 49 y.o. Gender: female  PCP: Dorrene GermanAVBUERE,EDWIN A, MD  Referring physician: Dr Hyacinth MeekerMiller  Chief Complaint:  Altered mental status with very high blood sugar  HPI: Patient is a 49 year old female with diabetes mellitus on insulin (does not remember her insulin regimen), was brought to ED via EMS for hyperglycemia. Unclear if the neighbor or a roommate called the EMS, patient is not able to provide much history herself. She reports that she had not been feeling well for the last 2 weeks, she has not been eating well, feeling fatigued. For approximately 5 days, she's been having increased urinary frequency and increased thirst. Patient reports that she ran out of insulin more than a week ago, possibly 2 weeks. She denied any fever or chills. Denies any pain. EDP recommendations reviewed, patient presented with BG 1089, anion gap of 39, sodium of 116, bicarbonate 12, creatinine 5.33. Hemoglobin 16.7. ABG showed pH of 7.2, PCO2 23.2 UA positive for UTI, with ketones and glucosuria, urine pregnancy test negative   Review of Systems:  Constitutional: Denies fever, chills, diaphoresis,+ poor appetite and fatigue.  HEENT: Denies photophobia, eye pain, redness, hearing loss, ear pain, congestion, sore throat, rhinorrhea, sneezing, mouth sores, trouble swallowing, neck pain, neck stiffness and tinnitus.   Respiratory: Denies SOB, DOE, cough, chest tightness,  and wheezing.   Cardiovascular: Denies chest pain, palpitations and leg swelling.  Gastrointestinal: Denies nausea, vomiting, abdominal pain, diarrhea, constipation, blood in stool and abdominal distention.  Genitourinary: Please see history of present illness  Musculoskeletal:  Denies myalgias, back pain, joint swelling, arthralgias and gait problem.  Skin: Denies pallor, rash and wound. Appears severely dehydrated  Neurological: Denies dizziness, seizures, syncope, weakness, light-headedness, numbness and headaches.  Hematological: Denies adenopathy. Easy bruising, personal or family bleeding history  Psychiatric/Behavioral: Denies suicidal ideation, mood changes, confusion, nervousness, sleep disturbance and agitation  Past Medical History: Past Medical History  Diagnosis Date  . Diabetes mellitus without complication     History reviewed. No pertinent past surgical history.  Medications:Patient apparently is on metformin and insulin however was not able to tell me her insulin regimen    Prior to Admission medications   Not on File    Allergies:  No Known Allergies  Social History: Patient reports that she has never smoked. She does not have any smokeless tobacco history on file. She reports that she does not drink alcohol or use illicit drugs.  Family History: Patient reports that diabetes runs in her family on her mother's side. Otherwise due to her mental status she was not able to provide much family history.  Physical Exam: Blood pressure 121/64, pulse 108, temperature 97.6 F (36.4 C), temperature source Oral, resp. rate 24, height 5\' 6"  (1.676 m), weight 63.504 kg (140 lb), SpO2 100 %. General: Alert, awake, oriented x2, in no acute distress Frail, appears dehydrated, . HEENT: normocephalic, atraumatic, anicteric sclera, pink conjunctiva, pupils equal and reactive to light and accomodation, oropharynx clear, dry mucous membranes  Neck: supple, no masses or lymphadenopathy, no  goiter, no bruits  Heart: Regular rate and rhythm, without murmurs, rubs or gallops. Lungs: Clear to auscultation bilaterally, no wheezing, rales or rhonchi. Abdomen: Soft, nontender, nondistended, positive bowel sounds, no masses. Extremities: No clubbing, cyanosis or  edema with positive pedal pulses. Neuro: Grossly intact, no focal neurological deficits, strength 5/5 upper and lower extremities bilaterally Psych: alert and oriented x 2 normal mood and affect Skin: no rashes or lesions, warm and dry   LABS on Admission:  Basic Metabolic Panel:  Recent Labs Lab 02/18/15 1056 02/18/15 1111  NA 116* 114*  K 5.6* 5.6*  CL 73* 83*  CO2 12*  --   GLUCOSE 1089* >700*  BUN 65* 54*  CREATININE 5.33* 5.00*  CALCIUM 9.0  --    Liver Function Tests:  Recent Labs Lab 02/18/15 1056  AST 14*  ALT 21  ALKPHOS 122  BILITOT 1.4*  PROT 8.3*  ALBUMIN 3.8   No results for input(s): LIPASE, AMYLASE in the last 168 hours. No results for input(s): AMMONIA in the last 168 hours. CBC:  Recent Labs Lab 02/18/15 1111  HGB 16.7*  HCT 49.0*   Cardiac Enzymes: No results for input(s): CKTOTAL, CKMB, CKMBINDEX, TROPONINI in the last 168 hours. BNP: Invalid input(s): POCBNP CBG:  Recent Labs Lab 02/18/15 1034  GLUCAP >600*    Radiological Exams on Admission:  No results found.  *I have personally reviewed the images above*    Assessment/Plan Principal Problem:   DKA (diabetic ketoacidoses) with profound acidosis, acute renal failure, hyponatremia and hyperkalemia - Placed on insulin drip, NPO and aggressive IV fluid hydration, check lactic acid (patient also is supposed to be on metformin) - Obtain serial BMET, will transition to subcutaneous insulin once bicarbonate above 18, anion gap closed less than 12, 4 consecutive blood sugar readings <180, patient alert and awake to eat  Active Problems:   UTI (lower urinary tract infection) -Follow urine culture and sensitivity  - Placed on IV Rocephin    Hyponatremia: pseudo hyponatremia, calculated sodium level 132, also has significant dehydration from DKA - Continue aggressive IV fluid hydration, follow serial BMET     Acute renal failure: Likely due to profound DKA and dehydration, UTI -  Obtain renal ultrasound to rule out any hydronephrosis, placed on aggressive IV fluid hydration, follow serial BMET    Hyperkalemia,  Metabolic acidosis - Likely due to #1, hopefully will improve with insulin drip and correction of hyperglycemia  DVT prophylaxis:  heparin subcutaneous   CODE STATUS:  full code   Family Communication: no family member is present at the bedside.  Admission, patients condition and plan of care including tests being ordered have been discussed with the patient who indicates understanding and agree with the plan and Code Status  Disposition plan: Further plan will depend as patient's clinical course evolves and further radiologic and laboratory data become available.   Time Spent on Admission: 60 mins    RAI,RIPUDEEP M.D. [facility] 02/18/2015, 12:38 PM Pager: [phone]  If 7PM-7AM, please contact night-coverage [email or web address] Password [email or web address]

## 2015-02-19 LAB — BASIC METABOLIC PANEL
ANION GAP: 15 (ref 5–15)
Anion gap: 10 (ref 5–15)
Anion gap: 14 (ref 5–15)
Anion gap: 7 (ref 5–15)
BUN: 41 mg/dL — ABNORMAL HIGH (ref 6–20)
BUN: 40 mg/dL — ABNORMAL HIGH (ref 6–20)
BUN: 36 mg/dL — AB (ref 6–20)
BUN: 26 mg/dL — ABNORMAL HIGH (ref 6–20)
CALCIUM: 7.8 mg/dL — AB (ref 8.9–10.3)
CHLORIDE: 108 mmol/L (ref 101–111)
CHLORIDE: 107 mmol/L (ref 101–111)
CO2: 16 mmol/L — ABNORMAL LOW (ref 22–32)
CO2: 17 mmol/L — AB (ref 22–32)
CO2: 24 mmol/L (ref 22–32)
CO2: 25 mmol/L (ref 22–32)
Calcium: 7.9 mg/dL — ABNORMAL LOW (ref 8.9–10.3)
Calcium: 7.8 mg/dL — ABNORMAL LOW (ref 8.9–10.3)
Calcium: 7.7 mg/dL — ABNORMAL LOW (ref 8.9–10.3)
Chloride: 100 mmol/L — ABNORMAL LOW (ref 101–111)
Chloride: 105 mmol/L (ref 101–111)
Creatinine, Ser: 2.84 mg/dL — ABNORMAL HIGH (ref 0.44–1.00)
Creatinine, Ser: 2.48 mg/dL — ABNORMAL HIGH (ref 0.44–1.00)
Creatinine, Ser: 2.35 mg/dL — ABNORMAL HIGH (ref 0.44–1.00)
Creatinine, Ser: 1.84 mg/dL — ABNORMAL HIGH (ref 0.44–1.00)
GFR calc Af Amer: 22 mL/min — ABNORMAL LOW (ref >60)
GFR calc Af Amer: 26 mL/min — ABNORMAL LOW (ref >60)
GFR calc Af Amer: 27 mL/min — ABNORMAL LOW (ref >60)
GFR calc Af Amer: 37 mL/min — ABNORMAL LOW (ref >60)
GFR calc non Af Amer: 23 mL/min — ABNORMAL LOW (ref >60)
GFR, EST NON AFRICAN AMERICAN: 32 mL/min — AB (ref >60)
GFR, EST NON AFRICAN AMERICAN: 19 mL/min — AB (ref >60)
GFR, EST NON AFRICAN AMERICAN: 22 mL/min — AB (ref >60)
GLUCOSE: 266 mg/dL — AB (ref 65–99)
GLUCOSE: 121 mg/dL — AB (ref 65–99)
Glucose, Bld: 152 mg/dL — ABNORMAL HIGH (ref 65–99)
Glucose, Bld: 186 mg/dL — ABNORMAL HIGH (ref 65–99)
Potassium: 3.3 mmol/L — ABNORMAL LOW (ref 3.5–5.1)
Potassium: 4.1 mmol/L (ref 3.5–5.1)
Potassium: 4.9 mmol/L (ref 3.5–5.1)
Potassium: 3.3 mmol/L — ABNORMAL LOW (ref 3.5–5.1)
SODIUM: 138 mmol/L (ref 135–145)
SODIUM: 134 mmol/L — AB (ref 135–145)
Sodium: 139 mmol/L (ref 135–145)
Sodium: 137 mmol/L (ref 135–145)

## 2015-02-19 LAB — URINE CULTURE: Colony Count: 80000

## 2015-02-19 LAB — GLUCOSE, CAPILLARY
GLUCOSE-CAPILLARY: 108 mg/dL — AB (ref 65–99)
Glucose-Capillary: 105 mg/dL — ABNORMAL HIGH (ref 65–99)
Glucose-Capillary: 112 mg/dL — ABNORMAL HIGH (ref 65–99)
Glucose-Capillary: 124 mg/dL — ABNORMAL HIGH (ref 65–99)
Glucose-Capillary: 162 mg/dL — ABNORMAL HIGH (ref 65–99)
Glucose-Capillary: 246 mg/dL — ABNORMAL HIGH (ref 65–99)
Glucose-Capillary: 293 mg/dL — ABNORMAL HIGH (ref 65–99)

## 2015-02-19 MED ORDER — SODIUM CHLORIDE 0.9 % IV SOLN
INTRAVENOUS | Status: DC
  Administered 2015-02-19: 03:00:00 via INTRAVENOUS

## 2015-02-19 MED ORDER — INSULIN GLARGINE 100 UNIT/ML ~~LOC~~ SOLN
20.0000 [IU] | Freq: Every day | SUBCUTANEOUS | Status: DC
  Administered 2015-02-19: 20 [IU] via SUBCUTANEOUS
  Filled 2015-02-19 (×2): qty 0.2

## 2015-02-19 MED ORDER — SODIUM CHLORIDE 0.9 % IV SOLN
INTRAVENOUS | Status: AC
  Administered 2015-02-19: 09:00:00 via INTRAVENOUS
  Filled 2015-02-19 (×2): qty 1000

## 2015-02-19 MED ORDER — SODIUM CHLORIDE 0.9 % IV SOLN: INTRAVENOUS | Status: DC

## 2015-02-19 MED ORDER — SODIUM CHLORIDE 0.9 % IV SOLN
INTRAVENOUS | Status: DC
  Administered 2015-02-19: 09:00:00 via INTRAVENOUS

## 2015-02-19 MED ORDER — INSULIN ASPART 100 UNIT/ML ~~LOC~~ SOLN
0.0000 [IU] | Freq: Three times a day (TID) | SUBCUTANEOUS | Status: DC
  Administered 2015-02-19: 11 [IU] via SUBCUTANEOUS
  Administered 2015-02-19: 3 [IU] via SUBCUTANEOUS
  Administered 2015-02-19 – 2015-02-20 (×3): 5 [IU] via SUBCUTANEOUS
  Administered 2015-02-21: 8 [IU] via SUBCUTANEOUS
  Administered 2015-02-21: 3 [IU] via SUBCUTANEOUS

## 2015-02-19 MED ORDER — INSULIN ASPART 100 UNIT/ML ~~LOC~~ SOLN
0.0000 [IU] | Freq: Every day | SUBCUTANEOUS | Status: DC
  Administered 2015-02-19: 3 [IU] via SUBCUTANEOUS
  Administered 2015-02-20: 2 [IU] via SUBCUTANEOUS

## 2015-02-19 MED ORDER — SODIUM CHLORIDE 0.9 % IV BOLUS (SEPSIS)
500.0000 mL | Freq: Once | INTRAVENOUS | Status: AC
  Administered 2015-02-19: 500 mL via INTRAVENOUS

## 2015-02-19 NOTE — Progress Notes (Signed)
[facility]                                                                              Patient Demographics  Stephanie Huber, is a 49 y.o. female, DOB - August 01, 1966, ZOX:[id number]  Admit date - 02/18/2015   Admitting Physician Ripudeep [name], MD  Outpatient Primary MD for the patient is Dorrene German, MD  LOS - 1   Chief Complaint  Patient presents with  . Hyperglycemia       Brief HPI   Patient is a 49 year old female with diabetes mellitus on insulin (does not remember her insulin regimen), was brought to ED via EMS for hyperglycemia. Unclear if the neighbor or a roommate called the EMS, patient is not able to provide much history herself. She reports that she had not been feeling well for the last 2 weeks, she has not been eating well, feeling fatigued. For approximately 5 days, she's been having increased urinary frequency and increased thirst. Patient reported that she ran out of insulin more than a week ago, possibly 2 weeks. She denied any fever or chills. Denied any pain. EDP recommendations reviewed, patient presented with BG 1089, anion gap of 39, sodium of 116, bicarbonate 12, creatinine 5.33. Hemoglobin 16.7. ABG showed pH of 7.2, PCO2 23.2 UA positive for UTI, with ketones and glucosuria, urine pregnancy test negative   Assessment & Plan    Principal Problem:  DKA (diabetic ketoacidoses) with profound acidosis, acute renal failure, hyponatremia and hyperkalemia - Patient has been transitioned to subcutaneous insulin, hemoglobin A1c pending  - Started on carbmodified diet, will follow CBG readings  - Inpatient diabetes coordinator consult, nutrition consult  Active Problems:  UTI (lower urinary tract infection) - Follow urine culture and sensitivity  - Placed on IV Rocephin   Hyponatremia: pseudo hyponatremia, due to hyperglycemia, resolved - Continue IV fluid hydration, follow serial BMET   Acute renal failure with metabolic acidosis:  Likely due to profound DKA and dehydration, UTI - Renal ultrasound showed no hydronephrosis however CO2 has started trending down - will continue IV fluids with bicarbonate drip   Hyperkalemia, - Likely due to #1, resolved  Hypotension: Improving, likely due to hypovolemia, at the time of my examination, BP 128/74 - Continue IV fluid hydration  DVT prophylaxis: heparin subcutaneous   Code Status: Full code  Family Communication: Discussed in detail with the patient, all imaging results, lab results explained to the patient    Disposition Plan: Transfer to telemetry floor today by lunchtime  Time Spent in minutes  25 minutes  Procedures  Renal ultrasound  Consults   None  DVT Prophylaxis heparin subcutaneous  Medications  Scheduled Meds: . cefTRIAXone (ROCEPHIN)  IV  1 g Intravenous Q24H  . heparin  5,000 Units Subcutaneous 3 times per day  . insulin aspart  0-15 Units Subcutaneous TID WC  . insulin aspart  0-5 Units Subcutaneous QHS  . insulin glargine  20 Units Subcutaneous Daily   Continuous Infusions: . sodium chloride 100 mL/hr at 02/19/15 0920  . sodium chloride 0.9 % 1,000 mL with sodium bicarbonate 100 mEq infusion 50 mL/hr at 02/19/15 0920  PRN Meds:.   Antibiotics   Anti-infectives    Start     Dose/Rate Route Frequency Ordered Stop   02/18/15 1500  cefTRIAXone (ROCEPHIN) 1 g in dextrose 5 % 50 mL IVPB  Status:  Discontinued     1 g 100 mL/hr over 30 Minutes Intravenous Every 24 hours 02/18/15 1449 02/18/15 1514   02/18/15 1300  cefTRIAXone (ROCEPHIN) 1 g in dextrose 5 % 50 mL IVPB     1 g 100 mL/hr over 30 Minutes Intravenous Every 24 hours 02/18/15 1246          Subjective:   Stephanie Huber was seen and examined today. Feeling better today, Patient denies dizziness, chest pain, shortness of breath, abdominal pain, N/V/D/C, new weakness, numbess, tingling. No acute events overnight.    Objective:   Blood pressure 83/34, pulse 93,  temperature 98.9 F (37.2 C), temperature source Oral, resp. rate 18, height 5\' 6"  (1.676 m), weight 63.504 kg (140 lb), SpO2 96 %.  Wt Readings from Last 3 Encounters:  02/18/15 63.504 kg (140 lb)     Intake/Output Summary (Last 24 hours) at 02/19/15 1049 Last data filed at 02/19/15 1000  Gross per 24 hour  Intake 4651.61 ml  Output    300 ml  Net 4351.61 ml    Exam  General: Alert and oriented x 3, NAD  HEENT:  PERRLA, EOMI  Neck: Supple, no JVD, no masses  CVS: S1 S2 auscultated, no rubs, murmurs or gallops. Regular rate and rhythm.  Respiratory: Clear to auscultation bilaterally, no wheezing, rales or rhonchi  Abdomen: Soft, nontender, nondistended, + bowel sounds  Ext: no cyanosis clubbing or edema  Neuro: AAOx3, Cr N's II- XII. Strength 5/5 upper and lower extremities bilaterally  Skin: No rashes  Psych: Normal affect and demeanor, alert and oriented x3    Data Review   Micro Results No results found for this or any previous visit (from the past 240 hour(s)).  Radiology Reports [facility]  02/18/2015   CLINICAL DATA:  Acute renal failure.  Diabetes.  EXAM: RENAL / URINARY TRACT ULTRASOUND COMPLETE  COMPARISON:  None.  FINDINGS: Right Kidney:  Length: 10.2 cm. Echogenicity within normal limits. No mass or hydronephrosis visualized.  Left Kidney:  Length: 10.4 cm. Echogenicity within normal limits. No mass or hydronephrosis visualized.  Bladder:  Appears normal for degree of bladder distention.  Other: Diffusely increased echogenicity hepatic parenchyma noted, consistent with hepatic steatosis.  IMPRESSION: Normal size and appearance of both kidneys. No evidence of hydronephrosis.  Hepatic steatosis incidentally noted.   Electronically Signed   By: Myles RosenthalJohn  Stahl M.D.   On: 02/18/2015 18:41    CBC  Recent Labs Lab 02/18/15 1111 02/18/15 1205  WBC  --  7.8  HGB 16.7* 11.3*  HCT 49.0* 33.7*  PLT  --  249  MCV  --  77.8*  MCH  --  26.1  MCHC  --  33.5  RDW  --   12.2  LYMPHSABS  --  0.8  MONOABS  --  0.9  EOSABS  --  0.0  BASOSABS  --  0.1    Chemistries   Recent Labs Lab 02/18/15 1056  02/18/15 2051 02/18/15 2212 02/19/15 0050 02/19/15 0305 02/19/15 0842  NA 116*  < > 136 133* 134* 139 138  K 5.6*  < > 3.3* 3.8 3.3* 4.1 4.9  CL 73*  < > 100* 101 100* 108 107  CO2 12*  < > 25 25 24  17*  16*  GLUCOSE 1089*  < > 206* 132* 121* 152* 186*  BUN 65*  < > 48* 42* 41* 40* 36*  CREATININE 5.33*  < > 3.27* 3.03* 2.84* 2.48* 2.35*  CALCIUM 9.0  < > 8.2* 8.5* 7.9* 7.8* 7.8*  AST 14*  --   --   --   --   --   --   ALT 21  --   --   --   --   --   --   ALKPHOS 122  --   --   --   --   --   --   BILITOT 1.4*  --   --   --   --   --   --   < > = values in this interval not displayed. ------------------------------------------------------------------------------------------------------------------ estimated creatinine clearance is 27.7 mL/min (by C-G formula based on Cr of 2.35). ------------------------------------------------------------------------------------------------------------------ No results for input(s): HGBA1C in the last 72 hours. ------------------------------------------------------------------------------------------------------------------ No results for input(s): CHOL, HDL, LDLCALC, TRIG, CHOLHDL, LDLDIRECT in the last 72 hours. ------------------------------------------------------------------------------------------------------------------ No results for input(s): TSH, T4TOTAL, T3FREE, THYROIDAB in the last 72 hours.  Invalid input(s): FREET3 ------------------------------------------------------------------------------------------------------------------ No results for input(s): VITAMINB12, FOLATE, FERRITIN, TIBC, IRON, RETICCTPCT in the last 72 hours.  Coagulation profile No results for input(s): INR, PROTIME in the last 168 hours.  No results for input(s): DDIMER in the last 72 hours.  Cardiac Enzymes No results for  input(s): CKMB, TROPONINI, MYOGLOBIN in the last 168 hours.  Invalid input(s): CK ------------------------------------------------------------------------------------------------------------------ Invalid input(s): POCBNP   Recent Labs  02/18/15 1415 02/18/15 2310 02/19/15 0032 02/19/15 0135 02/19/15 0239 02/19/15 0820  GLUCAP >600* 124* 105* 112* 108* 162*     RAI,RIPUDEEP M.D. [facility] 02/19/2015, 10:49 AM  Pager: [phone]   Between 7am to 7pm - call Pager - [phone]  After 7pm go to [email or web address] - password [email or web address]  Call night coverage person covering after 7pm

## 2015-02-20 LAB — BASIC METABOLIC PANEL
Anion gap: 9 (ref 5–15)
BUN: 19 mg/dL (ref 6–20)
CHLORIDE: 106 mmol/L (ref 101–111)
CO2: 25 mmol/L (ref 22–32)
CREATININE: 1.53 mg/dL — AB (ref 0.44–1.00)
Calcium: 7.8 mg/dL — ABNORMAL LOW (ref 8.9–10.3)
GFR calc non Af Amer: 39 mL/min — ABNORMAL LOW (ref >60)
GFR, EST AFRICAN AMERICAN: 46 mL/min — AB (ref >60)
Glucose, Bld: 227 mg/dL — ABNORMAL HIGH (ref 65–99)
Potassium: 3.1 mmol/L — ABNORMAL LOW (ref 3.5–5.1)
SODIUM: 140 mmol/L (ref 135–145)

## 2015-02-20 LAB — GLUCOSE, CAPILLARY
GLUCOSE-CAPILLARY: 221 mg/dL — AB (ref 65–99)
GLUCOSE-CAPILLARY: 220 mg/dL — AB (ref 65–99)
Glucose-Capillary: 193 mg/dL — ABNORMAL HIGH (ref 65–99)
Glucose-Capillary: 177 mg/dL — ABNORMAL HIGH (ref 65–99)
Glucose-Capillary: 305 mg/dL — ABNORMAL HIGH (ref 65–99)
Glucose-Capillary: 102 mg/dL — ABNORMAL HIGH (ref 65–99)
Glucose-Capillary: 225 mg/dL — ABNORMAL HIGH (ref 65–99)

## 2015-02-20 LAB — URINALYSIS, ROUTINE W REFLEX MICROSCOPIC
BILIRUBIN URINE: NEGATIVE
Glucose, UA: >1000 mg/dL — AB
Ketones, ur: 15 mg/dL — AB
LEUKOCYTES UA: NEGATIVE
Nitrite: NEGATIVE
PH: 6.5 (ref 5.0–8.0)
Protein, ur: NEGATIVE mg/dL
Specific Gravity, Urine: 1.016 (ref 1.005–1.030)
Urobilinogen, UA: 0.2 mg/dL (ref 0.0–1.0)

## 2015-02-20 LAB — URINE MICROSCOPIC-ADD ON

## 2015-02-20 LAB — HEMOGLOBIN A1C
Hgb A1c MFr Bld: 18.3 % — ABNORMAL HIGH (ref 4.8–5.6)
Mean Plasma Glucose: 479 mg/dL

## 2015-02-20 MED ORDER — FLUCONAZOLE 150 MG PO TABS
150.0000 mg | ORAL_TABLET | Freq: Once | ORAL | Status: AC
  Administered 2015-02-20: 150 mg via ORAL
  Filled 2015-02-20: qty 1.5

## 2015-02-20 MED ORDER — INSULIN ASPART 100 UNIT/ML ~~LOC~~ SOLN
3.0000 [IU] | Freq: Three times a day (TID) | SUBCUTANEOUS | Status: DC
  Administered 2015-02-20 – 2015-02-21 (×3): 3 [IU] via SUBCUTANEOUS

## 2015-02-20 MED ORDER — POTASSIUM CHLORIDE CRYS ER 20 MEQ PO TBCR
40.0000 meq | EXTENDED_RELEASE_TABLET | Freq: Once | ORAL | Status: AC
  Administered 2015-02-20: 40 meq via ORAL
  Filled 2015-02-20: qty 2

## 2015-02-20 MED ORDER — INSULIN GLARGINE 100 UNIT/ML ~~LOC~~ SOLN
25.0000 [IU] | Freq: Every day | SUBCUTANEOUS | Status: DC
  Administered 2015-02-20 – 2015-02-21 (×2): 25 [IU] via SUBCUTANEOUS
  Filled 2015-02-20 (×2): qty 0.25

## 2015-02-20 NOTE — Progress Notes (Signed)
Date:  February 20, 2015 U.R. performed for needs and level of care. Will continue to follow for Case Management needs.  Majesti Gambrell, RN, BSN, CCM   [phone] 

## 2015-02-20 NOTE — Care Management Note (Signed)
Case Management Note  Patient Details  Name: Stephanie Huber MRN: [id number] Date of Birth: 05-03-1966  Subjective/Objective:                 dka and requiring iv insulin   Action/Plan: home   Expected Discharge Date:  02/21/15               Expected Discharge Plan:  Home/Self Care  In-House Referral:  NA  Discharge planning Services  CM Consult  Post Acute Care Choice:  NA Choice offered to:  NA  DME Arranged:  N/A DME Agency:  NA  HH Arranged:  NA HH Agency:  NA  Status of Service:  In process, will continue to follow  Medicare Important Message Given:    Date Medicare IM Given:    Medicare IM give by:    Date Additional Medicare IM Given:    Additional Medicare Important Message give by:     If discussed at Long Length of Stay Meetings, dates discussed:    Additional Comments:  Golda AcreDavis, Rhonda Lynn, RN 02/20/2015, 11:53 AM

## 2015-02-20 NOTE — Progress Notes (Signed)
Inpatient Diabetes Program Recommendations  AACE/ADA: New Consensus Statement on Inpatient Glycemic Control (2013)  Target Ranges:  Prepandial:   less than 140 mg/dL      Peak postprandial:   less than 180 mg/dL (1-2 hours)      Critically ill patients:  140 - 180 mg/dL   Reason for Visit: Diabetes Consult  Diabetes history: DM1 Outpatient Diabetes medications: Lantus 20 units QHS and Novolog s/s (Has not taken in past few weeks), metformin Current orders for Inpatient glycemic control: Lantus 25 units QAM, Novolog moderate tidwc and hs + 3 units tidwc  Pt states she ran out of her insulin and MD would not give prescription without an OV. States she has lost some weight recently. Admits to drinking large quantities of soda and sweets - "eating what I want." Lives with 48 year old daughter. Pt appears somewhat confused when asked about how much insulin she was on, and whether or not blood sugar was being monitored.  Results for Aldean JewettRICK, Stana D (MRN [id number]) as of 02/20/2015 14:05  Ref. Range 02/19/2015 00:32 02/19/2015 01:35 02/19/2015 02:39 02/19/2015 03:51 02/19/2015 04:56 02/19/2015 08:20 02/19/2015 12:28 02/19/2015 17:18 02/19/2015 21:46 02/20/2015 11:32  Glucose-Capillary Latest Ref Range: 65-99 mg/dL 409105 (H) 811112 (H) 914108 (H) 193 (H) 177 (H) 162 (H) 246 (H) 305 (H) 293 (H) 220 (H)  Results for Aldean JewettRICK, Ita D (MRN [id number]) as of 02/20/2015 14:05  Ref. Range 02/18/2015 12:05  Hemoglobin A1C Latest Ref Range: 4.8-5.6 % 18.3 (H)   Uncontrolled blood sugars admitted with DKA. Transitioned off drip to Lantus and Novolog. Needs insulin adjustment.  Recommendations: Increase Novolog to 5 units tidwc for meal coverage insulin. Will order OP Diabetes Education consult for uncontrolled DM. Pt [facility]. Need [facility]. Will need prescription for meter supplies at discharge, along with insulin script.  Thank you. Ailene Ardshonda  Anacristina Steffek, RD, LDN, CDE Inpatient Diabetes Coordinator [phone]  Will see in am.

## 2015-02-20 NOTE — Evaluation (Signed)
Physical Therapy Evaluation Patient Details Name: BRITAIN ANAGNOS MRN: [id number] DOB: 1966-03-18 Today's Date: 02/20/2015   History of Present Illness  Admitted by EMS with blood sugar elevated 500+ and AMS.  Clinical Impression  Pt admitted with dx of DKA and presenting with functional mobility limitations 2* mild ambulatory instability.  Pt should progress to IND ambulation prior to dc home with family assist.    Follow Up Recommendations No PT follow up    Equipment Recommendations  None recommended by PT    Recommendations for Other Services       Precautions / Restrictions Precautions Precautions: Fall Restrictions Weight Bearing Restrictions: No      Mobility  Bed Mobility Overal bed mobility: Independent                Transfers Overall transfer level: Needs assistance Equipment used: None Transfers: Sit to/from Stand Sit to Stand: Min guard         General transfer comment: min guard to initial steady on feet  Ambulation/Gait Ambulation/Gait assistance: Min assist Ambulation Distance (Feet): 250 Feet Assistive device: None Gait Pattern/deviations: Step-through pattern Gait velocity: mod pace   General Gait Details: Min HHA for mild general instability vs min guard with pt using IV pole.  Pt states not as steady as before admission   Stairs            Wheelchair Mobility    Modified Rankin (Stroke Patients Only)       Balance                                             Pertinent Vitals/Pain Pain Assessment: No/denies pain    Home Living Family/patient expects to be discharged to:: Private residence Living Arrangements: Children Available Help at Discharge: Family Type of Home: House Home Access: Level entry     Home Layout: One level Home Equipment: Environmental consultant - 2 wheels      Prior Function Level of Independence: Independent               Hand Dominance        Extremity/Trunk Assessment   Upper Extremity Assessment: Overall WFL for tasks assessed           Lower Extremity Assessment: Overall WFL for tasks assessed         Communication   Communication: No difficulties  Cognition Arousal/Alertness: Awake/alert Behavior During Therapy: WFL for tasks assessed/performed Overall Cognitive Status: Within Functional Limits for tasks assessed                      General Comments      Exercises        Assessment/Plan    PT Assessment Patient needs continued PT services  PT Diagnosis Difficulty walking   PT Problem List Decreased balance;Decreased mobility  PT Treatment Interventions DME instruction;Gait training;Stair training;Functional mobility training;Therapeutic activities;Therapeutic exercise;Patient/family education   PT Goals (Current goals can be found in the Care Plan section) Acute Rehab PT Goals Patient Stated Goal: HOME PT Goal Formulation: With patient Time For Goal Achievement: 02/27/15 Potential to Achieve Goals: Good    Frequency Min 3X/week   Barriers to discharge        Co-evaluation               End of Session Equipment Utilized During Treatment: Gait belt Activity  Tolerance: Patient tolerated treatment well Patient left: in chair;with call bell/phone within reach Nurse Communication: Mobility status         Time: [id number] PT Time Calculation (min) (ACUTE ONLY): 14 min   Charges:   PT Evaluation $Initial PT Evaluation Tier I: 1 Procedure     PT G Codes:        Yash Cacciola 02/20/2015, 11:24 AM

## 2015-02-20 NOTE — Progress Notes (Signed)
[facility]                                                                              Patient Demographics  Stephanie Huber, is a 49 y.o. female, DOB - 31-Jul-1966, AOZ:[id number]  Admit date - 02/18/2015   Admitting Physician Ripudeep [name], MD  Outpatient Primary MD for the patient is Dorrene German, MD  LOS - 2   Chief Complaint  Patient presents with  . Hyperglycemia       Brief HPI   Patient is a 49 year old female with diabetes mellitus on insulin (does not remember her insulin regimen), was brought to ED via EMS for hyperglycemia. Unclear if the neighbor or a roommate called the EMS, patient is not able to provide much history herself. She reports that she had not been feeling well for the last 2 weeks, she has not been eating well, feeling fatigued. For approximately 5 days, she's been having increased urinary frequency and increased thirst. Patient reported that she ran out of insulin more than a week ago, possibly 2 weeks. She denied any fever or chills. Denied any pain. EDP recommendations reviewed, patient presented with BG 1089, anion gap of 39, sodium of 116, bicarbonate 12, creatinine 5.33. Hemoglobin 16.7. ABG showed pH of 7.2, PCO2 23.2 UA positive for UTI, with ketones and glucosuria, urine pregnancy test negative   Assessment & Plan    Principal Problem:  DKA (diabetic ketoacidoses) with profound acidosis, acute renal failure, hyponatremia and hyperkalemia - Patient has been transitioned to subcutaneous insulin, hemoglobin A1c still pending  - Started on carbmodified diet, CBG readings still high, increase Lantus to 25 units, added meal coverage  - Inpatient diabetes coordinator consult, nutrition consult  Active Problems:  UTI (lower urinary tract infection) - Urine culture showed yeast, discontinue IV Rocephin after today's dose -  Diflucan 150 mg 1   Hyponatremia: pseudo hyponatremia, due to hyperglycemia, resolved - Resolved with  IV fluid hydration   Acute renal failure with metabolic acidosis: Likely due to profound DKA and dehydration, UTI, creatinine 5.33 at the time of admission - Renal ultrasound showed no hydronephrosis however CO2 has started trending down, hence patient was placed on bicarbonate drip for 1 L - Creatinine function is improving, 1.5 today   Hyperkalemia, - Likely due to #1, resolved  Hypotension: Improving, likely due to hypovolemia,  DVT prophylaxis: heparin subcutaneous   Code Status: Full code  Family Communication: Discussed in detail with the patient, all imaging results, lab results explained to the patient    Disposition Plan: Transfer to MedSurg, likely DC   Time Spent in minutes  25 minutes  Procedures  Renal ultrasound  Consults   None  DVT Prophylaxis heparin subcutaneous  Medications  Scheduled Meds: . cefTRIAXone (ROCEPHIN)  IV  1 g Intravenous Q24H  . heparin  5,000 Units Subcutaneous 3 times per day  . insulin aspart  0-15 Units Subcutaneous TID WC  . insulin aspart  0-5 Units Subcutaneous QHS  . insulin aspart  3 Units Subcutaneous TID WC  . insulin glargine  25 Units Subcutaneous Daily   Continuous Infusions: . sodium chloride 75  mL/hr at 02/19/15 1202   PRN Meds:.   Antibiotics   Anti-infectives    Start     Dose/Rate Route Frequency Ordered Stop   02/20/15 1000  fluconazole (DIFLUCAN) tablet 150 mg     150 mg Oral  Once 02/20/15 0602 02/20/15 1051   02/18/15 1500  cefTRIAXone (ROCEPHIN) 1 g in dextrose 5 % 50 mL IVPB  Status:  Discontinued     1 g 100 mL/hr over 30 Minutes Intravenous Every 24 hours 02/18/15 1449 02/18/15 1514   02/18/15 1300  cefTRIAXone (ROCEPHIN) 1 g in dextrose 5 % 50 mL IVPB     1 g 100 mL/hr over 30 Minutes Intravenous Every 24 hours 02/18/15 1246          Subjective:   Stephanie Huber was seen and examined today. Patient denies dizziness, chest pain, shortness of breath, abdominal pain, N/V/D/C, new  weakness, numbess, tingling. No acute events overnight.    Objective:   Blood pressure 138/80, pulse 89, temperature 98.6 F (37 C), temperature source Oral, resp. rate 31, height 5\' 6"  (1.676 m), weight 82 kg (180 lb 12.4 oz), SpO2 99 %.  Wt Readings from Last 3 Encounters:  02/20/15 82 kg (180 lb 12.4 oz)     Intake/Output Summary (Last 24 hours) at 02/20/15 1130 Last data filed at 02/20/15 0000  Gross per 24 hour  Intake 1149.16 ml  Output   1150 ml  Net  -0.84 ml    Exam  General: Alert and oriented x 3, NAD  HEENT:  PERRLA, EOMI  Neck: Supple, no JVD, no masses  CVS: S1 S2 clear, RRR  Respiratory: CTAB  Abdomen: Soft, NT, ND, NBS  Ext: no cyanosis clubbing or edema  Neuro: no new deficits   Skin: No rashes  Psych: Normal affect and demeanor, alert and oriented x3    Data Review   Micro Results Recent Results (from the past 240 hour(s))  Urine culture     Status: None   Collection Time: 02/18/15 11:03 AM  Result Value Ref Range Status   Specimen Description URINE, CATHETERIZED  Final   Special Requests NONE  Final   Colony Count   Final    80,000 COLONIES/ML Performed at [facility]    Culture YEAST Performed at [facility]   Final   Report Status 02/19/2015 FINAL  Final    Radiology Reports [facility]  02/18/2015   CLINICAL DATA:  Acute renal failure.  Diabetes.  EXAM: RENAL / URINARY TRACT ULTRASOUND COMPLETE  COMPARISON:  None.  FINDINGS: Right Kidney:  Length: 10.2 cm. Echogenicity within normal limits. No mass or hydronephrosis visualized.  Left Kidney:  Length: 10.4 cm. Echogenicity within normal limits. No mass or hydronephrosis visualized.  Bladder:  Appears normal for degree of bladder distention.  Other: Diffusely increased echogenicity hepatic parenchyma noted, consistent with hepatic steatosis.  IMPRESSION: Normal size and appearance of both kidneys. No evidence of hydronephrosis.  Hepatic steatosis incidentally noted.    Electronically Signed   By: Myles RosenthalJohn  Stahl M.D.   On: 02/18/2015 18:41    CBC  Recent Labs Lab 02/18/15 1111 02/18/15 1205  WBC  --  7.8  HGB 16.7* 11.3*  HCT 49.0* 33.7*  PLT  --  249  MCV  --  77.8*  MCH  --  26.1  MCHC  --  33.5  RDW  --  12.2  LYMPHSABS  --  0.8  MONOABS  --  0.9  EOSABS  --  0.0  BASOSABS  --  0.1    Chemistries   Recent Labs Lab 02/18/15 1056  02/19/15 0050 02/19/15 0305 02/19/15 0842 02/19/15 2006 02/20/15 0345  NA 116*  < > 134* 139 138 137 140  K 5.6*  < > 3.3* 4.1 4.9 3.3* 3.1*  CL 73*  < > 100* 108 107 105 106  CO2 12*  < > 24 17* 16* 25 25  GLUCOSE 1089*  < > 121* 152* 186* 266* 227*  BUN 65*  < > 41* 40* 36* 26* 19  CREATININE 5.33*  < > 2.84* 2.48* 2.35* 1.84* 1.53*  CALCIUM 9.0  < > 7.9* 7.8* 7.8* 7.7* 7.8*  AST 14*  --   --   --   --   --   --   ALT 21  --   --   --   --   --   --   ALKPHOS 122  --   --   --   --   --   --   BILITOT 1.4*  --   --   --   --   --   --   < > = values in this interval not displayed. ------------------------------------------------------------------------------------------------------------------ estimated creatinine clearance is 49.1 mL/min (by C-G formula based on Cr of 1.53). ------------------------------------------------------------------------------------------------------------------ No results for input(s): HGBA1C in the last 72 hours. ------------------------------------------------------------------------------------------------------------------ No results for input(s): CHOL, HDL, LDLCALC, TRIG, CHOLHDL, LDLDIRECT in the last 72 hours. ------------------------------------------------------------------------------------------------------------------ No results for input(s): TSH, T4TOTAL, T3FREE, THYROIDAB in the last 72 hours.  Invalid input(s): FREET3 ------------------------------------------------------------------------------------------------------------------ No results for input(s):  VITAMINB12, FOLATE, FERRITIN, TIBC, IRON, RETICCTPCT in the last 72 hours.  Coagulation profile No results for input(s): INR, PROTIME in the last 168 hours.  No results for input(s): DDIMER in the last 72 hours.  Cardiac Enzymes No results for input(s): CKMB, TROPONINI, MYOGLOBIN in the last 168 hours.  Invalid input(s): CK ------------------------------------------------------------------------------------------------------------------ Invalid input(s): POCBNP   Recent Labs  02/19/15 0351 02/19/15 0456 02/19/15 0820 02/19/15 1228 02/19/15 1718 02/19/15 2146  GLUCAP 193* 177* 162* 246* 305* 293*     RAI,RIPUDEEP M.D. [facility] 02/20/2015, 11:30 AM  Pager: [phone]   Between 7am to 7pm - call Pager - [phone]  After 7pm go to [email or web address] - password [email or web address]  Call night coverage person covering after 7pm

## 2015-02-20 NOTE — Progress Notes (Signed)
Pt transferred to 1508 via wheelchair with all belongs. Report given to chasity, RN and all questions answered. Family notified via patient.

## 2015-02-21 DIAGNOSIS — E1310 Other specified diabetes mellitus with ketoacidosis without coma: Secondary | ICD-10-CM

## 2015-02-21 LAB — BASIC METABOLIC PANEL
Anion gap: 11 (ref 5–15)
BUN: 10 mg/dL (ref 6–20)
CO2: 27 mmol/L (ref 22–32)
Calcium: 8.3 mg/dL — ABNORMAL LOW (ref 8.9–10.3)
Chloride: 102 mmol/L (ref 101–111)
Creatinine, Ser: 1.26 mg/dL — ABNORMAL HIGH (ref 0.44–1.00)
GFR calc Af Amer: 58 mL/min — ABNORMAL LOW (ref >60)
GFR calc non Af Amer: 50 mL/min — ABNORMAL LOW (ref >60)
GLUCOSE: 206 mg/dL — AB (ref 65–99)
Potassium: 3.2 mmol/L — ABNORMAL LOW (ref 3.5–5.1)
Sodium: 140 mmol/L (ref 135–145)

## 2015-02-21 LAB — GLUCOSE, CAPILLARY
GLUCOSE-CAPILLARY: 572 mg/dL — AB (ref 65–99)
Glucose-Capillary: 475 mg/dL — ABNORMAL HIGH (ref 65–99)
Glucose-Capillary: 445 mg/dL — ABNORMAL HIGH (ref 65–99)
Glucose-Capillary: 292 mg/dL — ABNORMAL HIGH (ref 65–99)
Glucose-Capillary: 208 mg/dL — ABNORMAL HIGH (ref 65–99)
Glucose-Capillary: 127 mg/dL — ABNORMAL HIGH (ref 65–99)
Glucose-Capillary: 199 mg/dL — ABNORMAL HIGH (ref 65–99)
Glucose-Capillary: 256 mg/dL — ABNORMAL HIGH (ref 65–99)

## 2015-02-21 MED ORDER — INSULIN GLARGINE 100 UNIT/ML ~~LOC~~ SOLN: 25.0000 [IU] | Freq: Every day | SUBCUTANEOUS | Status: AC

## 2015-02-21 MED ORDER — INSULIN ASPART 100 UNIT/ML ~~LOC~~ SOLN
5.0000 [IU] | Freq: Three times a day (TID) | SUBCUTANEOUS | Status: DC
  Administered 2015-02-21: 5 [IU] via SUBCUTANEOUS

## 2015-02-21 MED ORDER — INSULIN ASPART 100 UNIT/ML ~~LOC~~ SOLN: 5.0000 [IU] | Freq: Three times a day (TID) | SUBCUTANEOUS | Status: DC

## 2015-02-21 MED ORDER — BLOOD GLUCOSE MONITOR KIT: PACK | Status: DC

## 2015-02-21 MED ORDER — POTASSIUM CHLORIDE CRYS ER 20 MEQ PO TBCR
40.0000 meq | EXTENDED_RELEASE_TABLET | Freq: Once | ORAL | Status: AC
  Administered 2015-02-21: 40 meq via ORAL
  Filled 2015-02-21: qty 2

## 2015-02-21 MED ORDER — INSULIN SYRINGES (DISPOSABLE) U-100 1 ML MISC: Status: AC

## 2015-02-21 MED ORDER — POTASSIUM CHLORIDE 10 MEQ/100ML IV SOLN: 10.0000 meq | INTRAVENOUS | Status: DC

## 2015-02-21 NOTE — Progress Notes (Signed)
NUTRITION NOTE   RD consulted for nutrition education regarding diabetes.   Lab Results  Component Value Date   HGBA1C 18.3* 02/18/2015    RD provided "Carbohydrate Counting for People with Diabetes" handout from the Academy of [employer] and Dietetics as well as 1800 Carbohydrate Controlled Diet from Nutrition 411 website. Discussed different food groups and their effects on blood sugar, emphasizing carbohydrate-containing foods. Provided list of carbohydrates and recommended serving sizes of common foods. Pt reports that DM educator (who is also an RD) talked with her about this topic yesterday and that she understands that she needs to eat carbohydrate foods throughout the day and balance them with protein foods to avoid highs and lows. She states that PTA she had not received any information concerning DM-specific diet and that she feels better equipped to control her diet when she discharges.  Discussed importance of controlled and consistent carbohydrate intake throughout the day. Provided examples of ways to balance meals/snacks and encouraged intake of high-fiber, whole grain complex carbohydrates. Teach back method used.  Expect fair compliance.  Body mass index is 29.19 kg/(m^2). Pt meets criteria for overweight based on current BMI.  Current diet order is Carbohydrate Modified, but no meal intakes documented since admission. Labs and medications reviewed. No further nutrition interventions warranted at this time. RD contact information provided. If additional nutrition issues arise, please re-consult RD.    Trenton GammonJessica Megan Hayduk, RD, LDN Inpatient Clinical Dietitian Pager # [phone] After hours/weekend pager # [phone]

## 2015-02-21 NOTE — Discharge Summary (Signed)
Physician Discharge Summary   Patient ID: ALAYZHA AN MRN: [id number] DOB/AGE: [birth year] 49 y.o.  Admit date: 02/18/2015 Discharge date: 02/21/2015  Primary Care Physician:  Philis Fendt, MD  Discharge Diagnoses:    . DKA (diabetic ketoacidoses) with severely uncontrolled diabetes mellitus  . UTI (lower urinary tract infection) . Hyponatremia . Acute renal failure . Hyperkalemia . Metabolic acidosis   Medical noncompliance  Consults:  Diabetic coordinator and nutrition consult   Recommendations for Outpatient Follow-up:  Patient was placed on Lantus 25 units daily with meal coverage 5 units 3 times a day  Patient was strongly recommended to follow carb modified diet (she had been eating whatever she wanted)  Please follow hemoglobin A1c closely, hemoglobin A1c 18.3 on 6/4  TESTS THAT NEED FOLLOW-UP Hemoglobin A1c   DIET: Carb modified diet    Allergies:  No Known Allergies   Discharge Medications:   Medication List    TAKE these medications        blood glucose meter kit and supplies Kit  Dispense based on patient and insurance preference. Use up to four times daily as directed. (FOR ICD-9 250.00, 250.01).     insulin aspart 100 UNIT/ML injection  Commonly known as:  novoLOG  Inject 5 Units into the skin 3 (three) times daily with meals.     insulin glargine 100 UNIT/ML injection  Commonly known as:  LANTUS  Inject 0.25 mLs (25 Units total) into the skin daily.     Insulin Syringes (Disposable) U-100 1 ML Misc  Use as directed         Brief H and P: For complete details please refer to admission H and P, but in briefPatient is a 49 year old female with diabetes mellitus on insulin (does not remember her insulin regimen), was brought to ED via EMS for hyperglycemia. Unclear if the neighbor or a roommate called the EMS, patient is not able to provide much history herself. She reports that she had not been feeling well for the last 2 weeks, she  has not been eating well, feeling fatigued. For approximately 5 days, she's been having increased urinary frequency and increased thirst. Patient reported that she ran out of insulin more than a week ago, possibly 2 weeks. She denied any fever or chills. Denied any pain. EDP recommendations reviewed, patient presented with BG 1089, anion gap of 39, sodium of 116, bicarbonate 12, creatinine 5.33. Hemoglobin 16.7. ABG showed pH of 7.2, PCO2 23.2 UA positive for UTI, with ketones and glucosuria, urine pregnancy test negative  Hospital Course:  DKA (diabetic ketoacidoses) with profound acidosis, acute renal failure, hyponatremia and hyperkalemia Patient was admitted to stepdown unit with IV insulin drip and aggressive IV fluid hydration. Patient has been transitioned to subcutaneous insulin, hemoglobin A1c extremely high 18.3. Diabetic coordinator and nutrition consults were obtained for diabetic teaching to the patient. She has been severely noncompliant, eating whatever she wants, had run out of insulin for a week prior to admission.  Patient was placed on Lantus 25 units daily with meal coverage, 5 units 3 times a day to keep the regimen simplified. Metformin was discontinued. Correction factor sliding scale was not added however patient was strongly recommended to follow up with her PCP within 7-10 days and adjust insulin regimen further.     UTI (lower urinary tract infection) - Urine culture showed yeast, discontinue IV Rocephin after 3 days, patient was given 1 dose of Diflucan     Hyponatremia: pseudo hyponatremia, due to hyperglycemia, resolved -  Resolved with IV fluid hydration   Acute renal failure with metabolic acidosis: Likely due to profound DKA and dehydration, UTI, creatinine 5.33 at the time of admission.  Renal ultrasound showed no hydronephrosis however CO2 has started trending down, hence patient was placed on bicarbonate drip for 1 L. Creatinine function has improved, 1.2 at  the time of discharge.   Hyperkalemia, - Likely due to #1, resolved  Hypotension: Resolved, likely due to severe DKA and hypovolemia    Day of Discharge BP 139/84 mmHg  Pulse 79  Temp(Src) 97.7 F (36.5 C) (Oral)  Resp 18  Ht $R'5\' 6"'Ff$  (1.676 m)  Wt 82 kg (180 lb 12.4 oz)  BMI 29.19 kg/m2  SpO2 100%  Physical Exam: General: Alert and awake oriented x3 not in any acute distress. HEENT: anicteric sclera, pupils reactive to light and accommodation CVS: S1-S2 clear no murmur rubs or gallops Chest: clear to auscultation bilaterally, no wheezing rales or rhonchi Abdomen: soft nontender, nondistended, normal bowel sounds Extremities: no cyanosis, clubbing or edema noted bilaterally Neuro: Cranial nerves II-XII intact, no focal neurological deficits   The results of significant diagnostics from this hospitalization (including imaging, microbiology, ancillary and laboratory) are listed below for reference.    LAB RESULTS: Basic Metabolic Panel:  Recent Labs Lab 02/20/15 0345 02/21/15 0509  NA 140 140  K 3.1* 3.2*  CL 106 102  CO2 25 27  GLUCOSE 227* 206*  BUN 19 10  CREATININE 1.53* 1.26*  CALCIUM 7.8* 8.3*   Liver Function Tests:  Recent Labs Lab 02/18/15 1056  AST 14*  ALT 21  ALKPHOS 122  BILITOT 1.4*  PROT 8.3*  ALBUMIN 3.8   No results for input(s): LIPASE, AMYLASE in the last 168 hours. No results for input(s): AMMONIA in the last 168 hours. CBC:  Recent Labs Lab 02/18/15 1111 02/18/15 1205  WBC  --  7.8  NEUTROABS  --  6.0  HGB 16.7* 11.3*  HCT 49.0* 33.7*  MCV  --  77.8*  PLT  --  249   Cardiac Enzymes: No results for input(s): CKTOTAL, CKMB, CKMBINDEX, TROPONINI in the last 168 hours. BNP: Invalid input(s): POCBNP CBG:  Recent Labs Lab 02/20/15 2154 02/21/15 0719  GLUCAP 225* 199*    Significant Diagnostic Studies:  US Renal  02/18/2015   CLINICAL DATA:  Acute renal failure.  Diabetes.  EXAM: RENAL / URINARY TRACT ULTRASOUND  COMPLETE  COMPARISON:  None.  FINDINGS: Right Kidney:  Length: 10.2 cm. Echogenicity within normal limits. No mass or hydronephrosis visualized.  Left Kidney:  Length: 10.4 cm. Echogenicity within normal limits. No mass or hydronephrosis visualized.  Bladder:  Appears normal for degree of bladder distention.  Other: Diffusely increased echogenicity hepatic parenchyma noted, consistent with hepatic steatosis.  IMPRESSION: Normal size and appearance of both kidneys. No evidence of hydronephrosis.  Hepatic steatosis incidentally noted.   Electronically Signed   By: Earle Gell M.D.   On: 02/18/2015 18:41      Disposition and Follow-up:     Discharge Instructions    Diet Carb Modified    Complete by:  As directed      Discharge instructions    Complete by:  As directed   It is VERY IMPORTANT that you follow up with a PCP on a regular basis.  Check your blood glucoses before each meal and at bedtime and maintain a log of your readings.  Bring this log with you when you follow up with your PCP so that  he or she can adjust your insulin at your follow up visit.     Increase activity slowly    Complete by:  As directed             DISPOSITION: Home   DISCHARGE FOLLOW-UP Follow-up Information    Follow up with AVBUERE,EDWIN A, MD. Schedule an appointment as soon as possible for a visit in 10 days.   Specialty:  Internal Medicine   Why:  for hospital follow-up   Contact information:   [place] [id number] [phone]        Time spent on Discharge: 35 minutes  Signed:   RAI,RIPUDEEP M.D. [facility] 02/21/2015, 9:31 AM Pager: [phone]

## 2015-06-12 ENCOUNTER — Other Ambulatory Visit: Payer: Self-pay

## 2015-06-12 DIAGNOSIS — Z1231 Encounter for screening mammogram for malignant neoplasm of breast: Secondary | ICD-10-CM

## 2015-08-21 ENCOUNTER — Ambulatory Visit
Admission: RE | Admit: 2015-08-21 | Discharge: 2015-08-21 | Disposition: A | Discharge status: Home / Self Care | Payer: Medicare Other | Source: Ambulatory Visit

## 2015-08-21 DIAGNOSIS — Z1231 Encounter for screening mammogram for malignant neoplasm of breast: Secondary | ICD-10-CM

## 2015-11-09 ENCOUNTER — Inpatient Hospital Stay (HOSPITAL_COMMUNITY)
Admission: EM | Admit: 2015-11-09 | Discharge: 2015-11-12 | DRG: 637 | Disposition: A | Discharge status: Home / Self Care | Payer: Medicare Other | Attending: Internal Medicine | Admitting: Internal Medicine

## 2015-11-09 ENCOUNTER — Encounter (HOSPITAL_COMMUNITY): Payer: Self-pay | Admitting: Emergency Medicine

## 2015-11-09 ENCOUNTER — Emergency Department (HOSPITAL_COMMUNITY): Payer: Medicare Other

## 2015-11-09 DIAGNOSIS — N183 Chronic kidney disease, stage 3 unspecified: Secondary | ICD-10-CM | POA: Insufficient documentation

## 2015-11-09 DIAGNOSIS — E1022 Type 1 diabetes mellitus with diabetic chronic kidney disease: Secondary | ICD-10-CM | POA: Diagnosis present

## 2015-11-09 DIAGNOSIS — G9340 Encephalopathy, unspecified: Secondary | ICD-10-CM | POA: Diagnosis present

## 2015-11-09 DIAGNOSIS — E1010 Type 1 diabetes mellitus with ketoacidosis without coma: Principal | ICD-10-CM

## 2015-11-09 DIAGNOSIS — E876 Hypokalemia: Secondary | ICD-10-CM | POA: Diagnosis present

## 2015-11-09 DIAGNOSIS — Z794 Long term (current) use of insulin: Secondary | ICD-10-CM

## 2015-11-09 DIAGNOSIS — R0682 Tachypnea, not elsewhere classified: Secondary | ICD-10-CM | POA: Diagnosis present

## 2015-11-09 DIAGNOSIS — E119 Type 2 diabetes mellitus without complications: Secondary | ICD-10-CM

## 2015-11-09 DIAGNOSIS — N179 Acute kidney failure, unspecified: Secondary | ICD-10-CM | POA: Diagnosis present

## 2015-11-09 DIAGNOSIS — R6510 Systemic inflammatory response syndrome (SIRS) of non-infectious origin without acute organ dysfunction: Secondary | ICD-10-CM | POA: Diagnosis present

## 2015-11-09 DIAGNOSIS — R000 Tachycardia, unspecified: Secondary | ICD-10-CM | POA: Diagnosis present

## 2015-11-09 DIAGNOSIS — R680 Hypothermia, not associated with low environmental temperature: Secondary | ICD-10-CM | POA: Diagnosis present

## 2015-11-09 DIAGNOSIS — R112 Nausea with vomiting, unspecified: Secondary | ICD-10-CM | POA: Diagnosis present

## 2015-11-09 DIAGNOSIS — I129 Hypertensive chronic kidney disease with stage 1 through stage 4 chronic kidney disease, or unspecified chronic kidney disease: Secondary | ICD-10-CM | POA: Diagnosis present

## 2015-11-09 DIAGNOSIS — R4182 Altered mental status, unspecified: Secondary | ICD-10-CM | POA: Diagnosis not present

## 2015-11-09 DIAGNOSIS — T68XXXA Hypothermia, initial encounter: Secondary | ICD-10-CM | POA: Diagnosis present

## 2015-11-09 DIAGNOSIS — E1110 Type 2 diabetes mellitus with ketoacidosis without coma: Secondary | ICD-10-CM | POA: Diagnosis present

## 2015-11-09 DIAGNOSIS — I10 Essential (primary) hypertension: Secondary | ICD-10-CM

## 2015-11-09 HISTORY — DX: Type 1 diabetes mellitus without complications: E10.9

## 2015-11-09 HISTORY — DX: Chronic kidney disease, stage 3 unspecified: N18.30

## 2015-11-09 HISTORY — DX: Chronic kidney disease, stage 3 (moderate): N18.3

## 2015-11-09 HISTORY — DX: Essential (primary) hypertension: I10

## 2015-11-09 LAB — BASIC METABOLIC PANEL
BUN: 11 mg/dL (ref 6–20)
BUN: 9 mg/dL (ref 6–20)
CHLORIDE: 102 mmol/L (ref 101–111)
CHLORIDE: 108 mmol/L (ref 101–111)
CO2: <7 mmol/L — ABNORMAL LOW (ref 22–32)
CREATININE: 1.35 mg/dL — AB (ref 0.44–1.00)
CREATININE: 1.2 mg/dL — AB (ref 0.44–1.00)
Calcium: 8.2 mg/dL — ABNORMAL LOW (ref 8.9–10.3)
Calcium: 8.2 mg/dL — ABNORMAL LOW (ref 8.9–10.3)
GFR calc non Af Amer: 46 mL/min — ABNORMAL LOW (ref >60)
GFR calc non Af Amer: 53 mL/min — ABNORMAL LOW (ref >60)
GFR, EST AFRICAN AMERICAN: 53 mL/min — AB (ref >60)
Glucose, Bld: 552 mg/dL (ref 65–99)
Glucose, Bld: 451 mg/dL — ABNORMAL HIGH (ref 65–99)
POTASSIUM: 5 mmol/L (ref 3.5–5.1)
POTASSIUM: 4.7 mmol/L (ref 3.5–5.1)
Sodium: 130 mmol/L — ABNORMAL LOW (ref 135–145)
Sodium: 136 mmol/L (ref 135–145)

## 2015-11-09 LAB — CBC WITH DIFFERENTIAL/PLATELET
BASOS PCT: 0 %
Basophils Absolute: 0 10*3/uL (ref 0.0–0.1)
EOS PCT: 0 %
Eosinophils Absolute: 0 10*3/uL (ref 0.0–0.7)
HCT: 44.8 % (ref 36.0–46.0)
HEMOGLOBIN: 14.4 g/dL (ref 12.0–15.0)
Lymphocytes Relative: 11 %
Lymphs Abs: 1.2 10*3/uL (ref 0.7–4.0)
MCH: 26.7 pg (ref 26.0–34.0)
MCHC: 32.1 g/dL (ref 30.0–36.0)
MCV: 83.1 fL (ref 78.0–100.0)
Monocytes Absolute: 0.9 10*3/uL (ref 0.1–1.0)
Monocytes Relative: 8 %
NEUTROS PCT: 81 %
Neutro Abs: 8.4 10*3/uL — ABNORMAL HIGH (ref 1.7–7.7)
Platelets: 277 10*3/uL (ref 150–400)
RBC: 5.39 MIL/uL — AB (ref 3.87–5.11)
RDW: 13.8 % (ref 11.5–15.5)
WBC: 10.5 10*3/uL (ref 4.0–10.5)

## 2015-11-09 LAB — I-STAT CHEM 8, ED
BUN: 13 mg/dL (ref 6–20)
CHLORIDE: 110 mmol/L (ref 101–111)
CREATININE: 0.8 mg/dL (ref 0.44–1.00)
Calcium, Ion: 1.03 mmol/L — ABNORMAL LOW (ref 1.12–1.23)
Glucose, Bld: 456 mg/dL — ABNORMAL HIGH (ref 65–99)
HEMATOCRIT: 49 % — AB (ref 36.0–46.0)
Hemoglobin: 16.7 g/dL — ABNORMAL HIGH (ref 12.0–15.0)
Potassium: 5.8 mmol/L — ABNORMAL HIGH (ref 3.5–5.1)
Sodium: 134 mmol/L — ABNORMAL LOW (ref 135–145)
TCO2: 6 mmol/L (ref 0–100)

## 2015-11-09 LAB — BLOOD GAS, ARTERIAL
ACID-BASE DEFICIT: 27.8 mmol/L — AB (ref 0.0–2.0)
Bicarbonate: 2.7 mEq/L — ABNORMAL LOW (ref 20.0–24.0)
DRAWN BY: 308601
FIO2: 0.21
O2 SAT: 97.8 %
PATIENT TEMPERATURE: 98.6
TCO2: 2.7 mmol/L (ref 0–100)
pCO2 arterial: 10.2 mmHg — CL (ref 35.0–45.0)
pH, Arterial: 7.05 — CL (ref 7.350–7.450)
pO2, Arterial: 146 mmHg — ABNORMAL HIGH (ref 80.0–100.0)

## 2015-11-09 LAB — URINE MICROSCOPIC-ADD ON: WBC, UA: NONE SEEN WBC/hpf (ref 0–5)

## 2015-11-09 LAB — PROTIME-INR
INR: 1.31 (ref 0.00–1.49)
PROTHROMBIN TIME: 15.9 s — AB (ref 11.6–15.2)

## 2015-11-09 LAB — BLOOD GAS, VENOUS
Acid-base deficit: 27.7 mmol/L — ABNORMAL HIGH (ref 0.0–2.0)
BICARBONATE: 4 meq/L — AB (ref 20.0–24.0)
DRAWN BY: 342671
O2 SAT: 78.6 %
PCO2 VEN: 17.2 mmHg — AB (ref 45.0–50.0)
PH VEN: 6.999 — AB (ref 7.250–7.300)
PO2 VEN: 53.2 mmHg — AB (ref 30.0–45.0)
Patient temperature: 98.6
TCO2: 4 mmol/L (ref 0–100)

## 2015-11-09 LAB — URINALYSIS, ROUTINE W REFLEX MICROSCOPIC
BILIRUBIN URINE: NEGATIVE
Glucose, UA: >1000 mg/dL — AB
Ketones, ur: >80 mg/dL — AB
LEUKOCYTES UA: NEGATIVE
NITRITE: NEGATIVE
PH: 5 (ref 5.0–8.0)
PROTEIN: 30 mg/dL — AB
SPECIFIC GRAVITY, URINE: 1.029 (ref 1.005–1.030)

## 2015-11-09 LAB — CBG MONITORING, ED
GLUCOSE-CAPILLARY: 495 mg/dL — AB (ref 65–99)
GLUCOSE-CAPILLARY: 360 mg/dL — AB (ref 65–99)
GLUCOSE-CAPILLARY: 273 mg/dL — AB (ref 65–99)
Glucose-Capillary: 429 mg/dL — ABNORMAL HIGH (ref 65–99)

## 2015-11-09 LAB — I-STAT CG4 LACTIC ACID, ED: Lactic Acid, Venous: 2.5 mmol/L (ref 0.5–2.0)

## 2015-11-09 LAB — APTT: APTT: 20 s — AB (ref 24–37)

## 2015-11-09 LAB — GLUCOSE, CAPILLARY: GLUCOSE-CAPILLARY: 233 mg/dL — AB (ref 65–99)

## 2015-11-09 LAB — POC URINE PREG, ED: Preg Test, Ur: NEGATIVE

## 2015-11-09 MED ORDER — SODIUM CHLORIDE 0.9 % IV SOLN: INTRAVENOUS | Status: DC

## 2015-11-09 MED ORDER — ENOXAPARIN SODIUM 40 MG/0.4ML ~~LOC~~ SOLN
40.0000 mg | Freq: Every day | SUBCUTANEOUS | Status: DC
  Administered 2015-11-09 – 2015-11-11 (×3): 40 mg via SUBCUTANEOUS
  Filled 2015-11-09 (×4): qty 0.4

## 2015-11-09 MED ORDER — HYDRALAZINE HCL 20 MG/ML IJ SOLN: 5.0000 mg | INTRAMUSCULAR | Status: DC | PRN

## 2015-11-09 MED ORDER — SODIUM CHLORIDE 0.9 % IV SOLN
INTRAVENOUS | Status: DC
  Administered 2015-11-09: 21:00:00 via INTRAVENOUS

## 2015-11-09 MED ORDER — SODIUM CHLORIDE 0.9 % IV BOLUS (SEPSIS)
1000.0000 mL | Freq: Once | INTRAVENOUS | Status: AC
  Administered 2015-11-09: 1000 mL via INTRAVENOUS

## 2015-11-09 MED ORDER — HYDROXYZINE HCL 50 MG/ML IM SOLN
25.0000 mg | Freq: Four times a day (QID) | INTRAMUSCULAR | Status: DC | PRN
  Filled 2015-11-09: qty 0.5

## 2015-11-09 MED ORDER — INSULIN REGULAR HUMAN 100 UNIT/ML IJ SOLN
INTRAMUSCULAR | Status: DC
  Administered 2015-11-10: 5.3 [IU]/h via INTRAVENOUS
  Administered 2015-11-10: 17:00:00 via INTRAVENOUS
  Administered 2015-11-10: 1.5 [IU]/h via INTRAVENOUS
  Administered 2015-11-11: 0.5 [IU]/h via INTRAVENOUS
  Filled 2015-11-09 (×2): qty 2.5

## 2015-11-09 MED ORDER — SODIUM CHLORIDE 0.9 % IV SOLN
INTRAVENOUS | Status: DC
  Administered 2015-11-09: 3.7 [IU]/h via INTRAVENOUS
  Filled 2015-11-09: qty 2.5

## 2015-11-09 MED ORDER — DEXTROSE-NACL 5-0.45 % IV SOLN
INTRAVENOUS | Status: DC
  Administered 2015-11-09 – 2015-11-10 (×2): via INTRAVENOUS

## 2015-11-09 MED ORDER — DEXTROSE-NACL 5-0.45 % IV SOLN: INTRAVENOUS | Status: DC

## 2015-11-09 NOTE — ED Notes (Signed)
ICU to change room to 41 and can take to the floor at 1050.

## 2015-11-09 NOTE — ED Notes (Signed)
X-ray at bedside

## 2015-11-09 NOTE — ED Notes (Signed)
Bed: RESB Expected date:  Expected time:  Means of arrival:  Comments: EMS-DKA

## 2015-11-09 NOTE — H&P (Addendum)
[facility] History and Physical  DWAN FENNEL LJQ:[id number] DOB: 11/29/1965 DOA: 11/09/2015  Referring physician: ED physician PCP: Philis Fendt, MD  Specialists:   Chief Complaint: AMS and generalized weakness   HPI: Stephanie Huber is a 50 y.o. female with PMH of type 1 diabetes, DKA, hypertension, CKD-III, who presents with AMS and generalized weakness.  Patient reports that she has not been feeling well and has generalized weak for about 2 days. She has nausea, and vomited twice. She had one loose stool yesterday, but no diarrhea today. Patient does not have abdominal pain, chest pain, shortness breath, cough, fever, chills, symptoms of UTI, unilateral weakness. Per EMS report, has hyperglycemia and was disoriented. When I saw pt in ED, she is mildly confused, but oriented x 3. She denies any pain, fever or chills. Patient states that she has been compliant to her insulin regimen.  In ED, patient was found to have elevated anion gap (>21) with bicarbonate less than 7, pseudohyponatremia, potassium 5.0, stable renal function, lactated 2.50, negative pregnancy test, negative urinalysis for UTI, but positive for ketone, hypothermia with temperature 96.2, tachycardia, tachypnea, negative chest x-ray. ABG showed pH 7.05, bicarbonate 10.2, PO2 146. Patient submitted to inpatient for further evaluation and treatment. PCCM was consulted by EDP, recommended admission to our service.  EKG: Independently reviewed. QTC 501, LAD, tachycardia  Where does patient live?   At home  Can patient participate in ADLs?  Yes    Review of Systems:   General: no fevers, chills, no changes in body weight, has poor appetite, has fatigue HEENT: no blurry vision, hearing changes or sore throat Pulm: no dyspnea, coughing, wheezing. Kussmaul breathing. CV: no chest pain, no palpitations Abd: has nausea, vomiting, no abdominal pain, diarrhea, constipation GU: no dysuria, burning on urination,  increased urinary frequency, hematuria  Ext: no leg edema Neuro: no unilateral weakness, numbness, or tingling, no vision change or hearing loss Skin: no rash MSK: No muscle spasm, no deformity, no limitation of range of movement in spin Heme: No easy bruising.  Travel history: No recent long distant travel.  Allergy: No Known Allergies  Past Medical History  Diagnosis Date  . Type 1 diabetes mellitus ([place])   . CKD (chronic kidney disease), stage III   . Essential hypertension     History reviewed. No pertinent past surgical history.  Social History:  reports that she has never smoked. She does not have any smokeless tobacco history on file. She reports that she does not drink alcohol or use illicit drugs.  Family History:  Family History  Problem Relation Age of Onset  . Dementia       Prior to Admission medications   Medication Sig Start Date End Date Taking? Authorizing Provider  blood glucose meter kit and supplies KIT Dispense based on patient and insurance preference. Use up to four times daily as directed. (FOR ICD-9 250.00, 250.01). 02/21/15  Yes Ripudeep K Rai, MD  insulin aspart (NOVOLOG) 100 UNIT/ML injection Inject 5 Units into the skin 3 (three) times daily with meals. 02/21/15  Yes Ripudeep Krystal Eaton, MD  insulin glargine (LANTUS) 100 UNIT/ML injection Inject 0.25 mLs (25 Units total) into the skin daily. 02/21/15  Yes Ripudeep Krystal Eaton, MD  Insulin Syringes, Disposable, U-100 1 ML MISC Use as directed 02/21/15  Yes Ripudeep Krystal Eaton, MD    Physical Exam: Filed Vitals:   11/09/15 2100 11/09/15 2101 11/09/15 2135 11/09/15 2200  BP: 158/94 158/94  154/87  Pulse:  109 108  112  Temp:   97.1 F (36.2 C)   TempSrc:   Rectal   Resp: _0 SpO2: 100% 100%  100%   General: Not in acute distress. Dry mucous and membrane. Patient looks tired. HEENT:       Eyes: PERRL, EOMI, no scleral icterus.       ENT: No discharge from the ears and nose, no pharynx injection, no tonsillar  enlargement.        Neck: No JVD, no bruit, no mass felt. Heme: No neck lymph node enlargement. Cardiac: S1/S2, RRR, tachycardia, No murmurs, No gallops or rubs. Pulm: No rales, wheezing, rhonchi or rubs. Abd: Soft, nondistended, nontender, no rebound pain, no organomegaly, BS present. Ext: No pitting leg edema bilaterally. 2+DP/PT pulse bilaterally. Musculoskeletal: No joint deformities, No joint redness or warmth, no limitation of ROM in spin. Skin: No rashes.  Neuro: Alert,  oriented X3, cranial nerves II-XII grossly intact, moves all extremities normally.  Psych: Patient is not psychotic, no suicidal or hemocidal ideation.  Labs on Admission:  Basic Metabolic Panel:  Recent Labs Lab 11/09/15 1803 11/09/15 2033 11/09/15 2047  NA 130* 136 134*  K 5.0 4.7 5.8*  CL 102 108 110  CO2 <7* <7*  --   GLUCOSE 552* 451* 456*  BUN _1 CREATININE 1.35* 1.20* 0.80  CALCIUM 8.2* 8.2*  --    Liver Function Tests: No results for input(s): AST, ALT, ALKPHOS, BILITOT, PROT, ALBUMIN in the last 168 hours. No results for input(s): LIPASE, AMYLASE in the last 168 hours. No results for input(s): AMMONIA in the last 168 hours. CBC:  Recent Labs Lab 11/09/15 1803 11/09/15 2047  WBC 10.5  --   NEUTROABS 8.4*  --   HGB 14.4 16.7*  HCT 44.8 49.0*  MCV 83.1  --   PLT 277  --    Cardiac Enzymes: No results for input(s): CKTOTAL, CKMB, CKMBINDEX, TROPONINI in the last 168 hours.  BNP (last 3 results) No results for input(s): BNP in the last 8760 hours.  ProBNP (last 3 results) No results for input(s): PROBNP in the last 8760 hours.  CBG:  Recent Labs Lab 11/09/15 1728 11/09/15 2025 11/09/15 2143 11/09/15 2249  GLUCAP 495* 429* 360* 273*    Radiological Exams on Admission: Dg Chest Port 1 View  11/09/2015  CLINICAL DATA:  Per [facility] pt has hyperglycemia and decreased loc today. Pt is disoriented. Pt reports nausea yesterday. Diabetic. Non-smoker. EXAM: PORTABLE CHEST 1 VIEW  COMPARISON:  09/21/2006 FINDINGS: The heart size and mediastinal contours are within normal limits. Both lungs are clear. No pleural effusion or pneumothorax. The visualized skeletal structures are unremarkable. IMPRESSION: No active disease. Electronically Signed   By: Lajean Manes M.D.   On: 11/09/2015 19:53    Assessment/Plan Principal Problem:   DKA (diabetic ketoacidoses) ([place]) Active Problems:   Hypothermia   Acute encephalopathy   SIRS (systemic inflammatory response syndrome) ([facility])   Type 1 diabetes mellitus with stage 3 chronic kidney disease ([place])   Essential hypertension   DKA (diabetic ketoacidoses) ([place]): patient has AG>21 and bicarbonate<7, ketone positivity in urine. Mild confusion. Etiology is not clear. Patient states that she has been compliant to her medications. No infection source identified on admission. Patient has negative urinalysis for UTI and negative chest x-ray. ABG showed pH 7.05, bicarbonate 10.2, PO2 146.  - Admit to stepdown  - Received 3L of NS bolus in ED (2L in ED and  1L by EMS) - start DKA protocol with BMP q3h - IVF: NS 125 cc/h; will switch to D5-1/2NS when CBG<250 - replete K as needed - Hydroxyzine prn nausea (QTc 501, not a good candidate for Zofran) - NPO  - blood culture x 2  Acute encephalopathy: due to DKA. Now has mild confusion, but oriented 3. -Treat DKA as above -Frequent neuro checks  DM-I: Last A1c 18.3 on 02/18/15, poorly controled. Patient is taking Lantus and NovoLog at home -On DKA protocol now -Check A1c -consult to diabetic educator  SIRS (systemic inflammatory response syndrome): Patient meets criteria for SIRS, with elevated lactate, hypothermia, tachypnea, tachycardia. No source of infection identified. -will get Procalcitonin and trend lactic acid levels -IVF: 3L of NS bolus in ED, followed by 125 cc/h  Hypertension: Patient had history of hypertension. She was on medications before, which was discontinued last year  by her doctor. Now presenting with elevated blood pressure 169/97. -IV hydralazine when necessary -May start oral medications if blood pressure is persistently elevated  Hypothermia: likely due to SIRS -warm blanket  CDKD-III: stable. Baseline creatinine 1.2-1.5. Her creatinine is 1.35 on admission, which is at the baseline. -Follow-up renal function that BMP    DVT ppx: SQ Lovenox  Code Status: Full code Family Communication:   Yes, patient's cousin, aunt, uncle, daughter at bed side Disposition Plan: Admit to inpatient   Date of Service 11/09/2015    Ivor Costa [facility] Pager [phone]  If 7PM-7AM, please contact night-coverage [email or web address] Password [facility] 11/09/2015, 11:04 PM

## 2015-11-09 NOTE — ED Notes (Signed)
Per [facility] pt has hyperglycemia and decreased loc today, CBG 528 per EMS. Pt is disoriented , Hx DKA. Kussmaul breathing and ketone smell. 750 cc in route. Type I Diabetes . Reports nausea yesterday , no emesis today. 160/100 BP, 112 HR, 100 RA, 36 RR

## 2015-11-09 NOTE — ED Provider Notes (Addendum)
CSN: [id number]     Arrival date & time 11/09/15  1711 History   First MD Initiated Contact with Patient 11/09/15 1730     Chief Complaint  Patient presents with  . Hyperglycemia     (Consider location/radiation/quality/duration/timing/severity/associated sxs/prior Treatment) HPI Comments: Pt with type 1 Diabetes comes in with weakness and elevated blood sugar. She reports that she has been feeling unwell for the last 2 days. Pt has no headache, neck pain, chest pain, abd pain, rash, joint pains, neck stiffness, uti like symptoms. + emesis x 5 today, brown emesis. 2+ loose BM. Pt deneis any fevers, any uri like symptoms. She has been taking her meds as prescribed.  Per EMS, pt was disoriented when they arrived, she was given 1 liter of fluid en-route - currently, not disoriented.   ROS 10 Systems reviewed and are negative for acute change except as noted in the HPI.      Patient is a 50 y.o. female presenting with hyperglycemia. The history is provided by the patient.  Hyperglycemia   Past Medical History  Diagnosis Date  . Diabetes mellitus without complication (HCC)    History reviewed. No pertinent past surgical history. No family history on file. Social History  Substance Use Topics  . Smoking status: Never Smoker   . Smokeless tobacco: None  . Alcohol Use: No   OB History    No data available     Review of Systems    Allergies  Review of patient's allergies indicates no known allergies.  Home Medications   Prior to Admission medications   Medication Sig Start Date End Date Taking? Authorizing Provider  blood glucose meter kit and supplies KIT Dispense based on patient and insurance preference. Use up to four times daily as directed. (FOR ICD-9 250.00, 250.01). 02/21/15  Yes Ripudeep K Rai, MD  insulin aspart (NOVOLOG) 100 UNIT/ML injection Inject 5 Units into the skin 3 (three) times daily with meals. 02/21/15  Yes Ripudeep [name], MD  insulin glargine (LANTUS)  100 UNIT/ML injection Inject 0.25 mLs (25 Units total) into the skin daily. 02/21/15  Yes Ripudeep [name], MD  Insulin Syringes, Disposable, U-100 1 ML MISC Use as directed 02/21/15  Yes Ripudeep K Rai, MD   BP 149/80 mmHg  Pulse 106  Temp(Src) 96.2 F (35.7 C)  Resp 27  SpO2 100% Physical Exam  Constitutional: She is oriented to person, place, and time. She appears well-developed.  HENT:  Head: Normocephalic and atraumatic.  Eyes: Conjunctivae and EOM are normal. Pupils are equal, round, and reactive to light.  Neck: Normal range of motion. Neck supple. No JVD present.  Cardiovascular: Regular rhythm and normal heart sounds.   Pulmonary/Chest: Effort normal and breath sounds normal. No respiratory distress.  Abdominal: Soft. Bowel sounds are normal. She exhibits no distension. There is no tenderness. There is no rebound and no guarding.  Neurological: She is alert and oriented to person, place, and time.  Skin: Skin is warm and dry.  Nursing note and vitals reviewed.   ED Course  Procedures (including critical care time) Labs Review Labs Reviewed  CBC WITH DIFFERENTIAL/PLATELET - Abnormal; Notable for the following:    RBC 5.39 (*)    Neutro Abs 8.4 (*)    All other components within normal limits  BASIC METABOLIC PANEL - Abnormal; Notable for the following:    Sodium 130 (*)    CO2 <7 (*)    Glucose, Bld 552 (*)    Creatinine, Ser  1.35 (*)    Calcium 8.2 (*)    GFR calc non Af Amer 46 (*)    GFR calc Af Amer 53 (*)    All other components within normal limits  BLOOD GAS, VENOUS - Abnormal; Notable for the following:    pH, Ven 6.999 (*)    pCO2, Ven 17.2 (*)    pO2, Ven 53.2 (*)    Bicarbonate 4.0 (*)    Acid-base deficit 27.7 (*)    All other components within normal limits  URINALYSIS, ROUTINE W REFLEX MICROSCOPIC (NOT AT [facility]) - Abnormal; Notable for the following:    APPearance CLOUDY (*)    Glucose, UA >1000 (*)    Hgb urine dipstick SMALL (*)    Ketones, ur >80  (*)    Protein, ur 30 (*)    All other components within normal limits  URINE MICROSCOPIC-ADD ON - Abnormal; Notable for the following:    Squamous Epithelial / LPF 6-30 (*)    Bacteria, UA FEW (*)    Casts HYALINE CASTS (*)    All other components within normal limits  CBG MONITORING, ED - Abnormal; Notable for the following:    Glucose-Capillary 495 (*)    All other components within normal limits  I-STAT CG4 LACTIC ACID, ED - Abnormal; Notable for the following:    Lactic Acid, Venous 2.50 (*)    All other components within normal limits  POC URINE PREG, ED  I-STAT CHEM 8, ED    Imaging Review No results found. I have personally reviewed and evaluated these images and lab results as part of my medical decision-making.   EKG Interpretation   Date/Time:  Thursday November 09 2015 17:53:25 EST Ventricular Rate:  114 PR Interval:  146 QRS Duration: 78 QT Interval:  364 QTC Calculation: 501 R Axis:   -84 Text Interpretation:  Sinus tachycardia LAE, consider biatrial enlargement  Left anterior fascicular block Borderline low voltage, extremity leads  Borderline ST depression, lateral leads Borderline prolonged QT interval  Confirmed by Hazle Coca [id number]) on 11/09/2015 6:25:12 PM      MDM   Final diagnoses:  Type 1 diabetes mellitus with ketoacidosis without coma ([place])    Pt comes in with kussmaul's respiration and tachycardia. She has hx of DKA, and appears to be in it again. Pt has an elevated CBG. She was disoriented when EMS arrives, now she is alert and oriented. She reports just feeling unwell, otherwise has no specific complains. She denies medication non compliance. Has had DKA in the past. Unsure what triggered her into DKA.... We will need better history. PH is 6.99, HCO3 < 7. Will admit to CCM.  UA is clear. Lungs are clear. Abd exam is benign. Think all the SIRS and lactate are from DKA. She has no source of infection currently.... So we will get blood  cultures, but not start her on any antibiotics.  CRITICAL CARE Performed by: Varney Biles   Total critical care time: 40 minutes  Critical care time was exclusive of separately billable procedures and treating other patients.  Critical care was necessary to treat or prevent imminent or life-threatening deterioration.  Critical care was time spent personally by me on the following activities: development of treatment plan with patient and/or surrogate as well as nursing, discussions with consultants, evaluation of patient's response to treatment, examination of patient, obtaining history from patient or surrogate, ordering and performing treatments and interventions, ordering and review of laboratory studies, ordering and review of  radiographic studies, pulse oximetry and re-evaluation of patient's condition.      Varney Biles, MD 11/09/15 Curly Rim  Varney Biles, MD 11/09/15 [phone]

## 2015-11-09 NOTE — Progress Notes (Signed)
[facility] received consult regarding patient having difficulty affording her  Medications.  Patient listed as having UHC Medicare and Medicaid [employer].  Unable to assist with cost of medications due to patient already having insurance.  No further EDCM needs at this time.

## 2015-11-10 DIAGNOSIS — R6510 Systemic inflammatory response syndrome (SIRS) of non-infectious origin without acute organ dysfunction: Secondary | ICD-10-CM

## 2015-11-10 DIAGNOSIS — I10 Essential (primary) hypertension: Secondary | ICD-10-CM

## 2015-11-10 DIAGNOSIS — E1010 Type 1 diabetes mellitus with ketoacidosis without coma: Principal | ICD-10-CM

## 2015-11-10 DIAGNOSIS — T68XXXD Hypothermia, subsequent encounter: Secondary | ICD-10-CM

## 2015-11-10 DIAGNOSIS — N179 Acute kidney failure, unspecified: Secondary | ICD-10-CM

## 2015-11-10 DIAGNOSIS — N183 Chronic kidney disease, stage 3 (moderate): Secondary | ICD-10-CM

## 2015-11-10 DIAGNOSIS — E1022 Type 1 diabetes mellitus with diabetic chronic kidney disease: Secondary | ICD-10-CM

## 2015-11-10 DIAGNOSIS — G9340 Encephalopathy, unspecified: Secondary | ICD-10-CM

## 2015-11-10 LAB — BASIC METABOLIC PANEL
ANION GAP: 10 (ref 5–15)
Anion gap: 16 — ABNORMAL HIGH (ref 5–15)
Anion gap: 11 (ref 5–15)
Anion gap: 10 (ref 5–15)
Anion gap: 9 (ref 5–15)
BUN: 6 mg/dL (ref 6–20)
BUN: 6 mg/dL (ref 6–20)
BUN: 8 mg/dL (ref 6–20)
BUN: 8 mg/dL (ref 6–20)
BUN: 8 mg/dL (ref 6–20)
BUN: <5 mg/dL — ABNORMAL LOW (ref 6–20)
CALCIUM: 8.2 mg/dL — AB (ref 8.9–10.3)
CALCIUM: 8.3 mg/dL — AB (ref 8.9–10.3)
CALCIUM: 8.4 mg/dL — AB (ref 8.9–10.3)
CALCIUM: 8.8 mg/dL — AB (ref 8.9–10.3)
CALCIUM: 9.1 mg/dL (ref 8.9–10.3)
CALCIUM: 9.1 mg/dL (ref 8.9–10.3)
CHLORIDE: 114 mmol/L — AB (ref 101–111)
CO2: 10 mmol/L — AB (ref 22–32)
CO2: 13 mmol/L — AB (ref 22–32)
CO2: 15 mmol/L — AB (ref 22–32)
CO2: 14 mmol/L — ABNORMAL LOW (ref 22–32)
CO2: 15 mmol/L — ABNORMAL LOW (ref 22–32)
CREATININE: 0.76 mg/dL (ref 0.44–1.00)
CREATININE: 0.82 mg/dL (ref 0.44–1.00)
CREATININE: 0.83 mg/dL (ref 0.44–1.00)
CREATININE: 0.96 mg/dL (ref 0.44–1.00)
CREATININE: 1.05 mg/dL — AB (ref 0.44–1.00)
CREATININE: 1.11 mg/dL — AB (ref 0.44–1.00)
Chloride: 111 mmol/L (ref 101–111)
Chloride: 111 mmol/L (ref 101–111)
Chloride: 113 mmol/L — ABNORMAL HIGH (ref 101–111)
Chloride: 114 mmol/L — ABNORMAL HIGH (ref 101–111)
Chloride: 115 mmol/L — ABNORMAL HIGH (ref 101–111)
GFR calc Af Amer: >60 mL/min (ref >60)
GFR calc Af Amer: >60 mL/min (ref >60)
GFR calc Af Amer: >60 mL/min (ref >60)
GFR calc Af Amer: >60 mL/min (ref >60)
GFR calc non Af Amer: >60 mL/min (ref >60)
GFR calc non Af Amer: >60 mL/min (ref >60)
GFR calc non Af Amer: >60 mL/min (ref >60)
GFR, EST NON AFRICAN AMERICAN: 58 mL/min — AB (ref >60)
GLUCOSE: 142 mg/dL — AB (ref 65–99)
GLUCOSE: 161 mg/dL — AB (ref 65–99)
GLUCOSE: 193 mg/dL — AB (ref 65–99)
GLUCOSE: 217 mg/dL — AB (ref 65–99)
GLUCOSE: 286 mg/dL — AB (ref 65–99)
Glucose, Bld: 129 mg/dL — ABNORMAL HIGH (ref 65–99)
Potassium: 3.2 mmol/L — ABNORMAL LOW (ref 3.5–5.1)
Potassium: 3.4 mmol/L — ABNORMAL LOW (ref 3.5–5.1)
Potassium: 3.6 mmol/L (ref 3.5–5.1)
Potassium: 3.6 mmol/L (ref 3.5–5.1)
Potassium: 3.8 mmol/L (ref 3.5–5.1)
Potassium: 4.1 mmol/L (ref 3.5–5.1)
SODIUM: 138 mmol/L (ref 135–145)
Sodium: 137 mmol/L (ref 135–145)
Sodium: 137 mmol/L (ref 135–145)
Sodium: 137 mmol/L (ref 135–145)
Sodium: 138 mmol/L (ref 135–145)
Sodium: 140 mmol/L (ref 135–145)

## 2015-11-10 LAB — LIPID PANEL
CHOL/HDL RATIO: 4.9 ratio
CHOLESTEROL: 192 mg/dL (ref 0–200)
HDL: 39 mg/dL — ABNORMAL LOW (ref >40)
LDL CALC: 131 mg/dL — AB (ref 0–99)
TRIGLYCERIDES: 108 mg/dL (ref <150)
VLDL: 22 mg/dL (ref 0–40)

## 2015-11-10 LAB — GLUCOSE, CAPILLARY
GLUCOSE-CAPILLARY: 186 mg/dL — AB (ref 65–99)
GLUCOSE-CAPILLARY: 136 mg/dL — AB (ref 65–99)
GLUCOSE-CAPILLARY: 154 mg/dL — AB (ref 65–99)
GLUCOSE-CAPILLARY: 135 mg/dL — AB (ref 65–99)
GLUCOSE-CAPILLARY: 179 mg/dL — AB (ref 65–99)
GLUCOSE-CAPILLARY: 159 mg/dL — AB (ref 65–99)
GLUCOSE-CAPILLARY: 118 mg/dL — AB (ref 65–99)
GLUCOSE-CAPILLARY: 104 mg/dL — AB (ref 65–99)
GLUCOSE-CAPILLARY: 162 mg/dL — AB (ref 65–99)
Glucose-Capillary: 116 mg/dL — ABNORMAL HIGH (ref 65–99)
Glucose-Capillary: 124 mg/dL — ABNORMAL HIGH (ref 65–99)
Glucose-Capillary: 142 mg/dL — ABNORMAL HIGH (ref 65–99)
Glucose-Capillary: 143 mg/dL — ABNORMAL HIGH (ref 65–99)
Glucose-Capillary: 155 mg/dL — ABNORMAL HIGH (ref 65–99)
Glucose-Capillary: 165 mg/dL — ABNORMAL HIGH (ref 65–99)
Glucose-Capillary: 168 mg/dL — ABNORMAL HIGH (ref 65–99)
Glucose-Capillary: 174 mg/dL — ABNORMAL HIGH (ref 65–99)
Glucose-Capillary: 176 mg/dL — ABNORMAL HIGH (ref 65–99)
Glucose-Capillary: 184 mg/dL — ABNORMAL HIGH (ref 65–99)
Glucose-Capillary: 190 mg/dL — ABNORMAL HIGH (ref 65–99)
Glucose-Capillary: 202 mg/dL — ABNORMAL HIGH (ref 65–99)
Glucose-Capillary: 247 mg/dL — ABNORMAL HIGH (ref 65–99)

## 2015-11-10 LAB — RAPID URINE DRUG SCREEN, HOSP PERFORMED
AMPHETAMINES: NOT DETECTED
BENZODIAZEPINES: NOT DETECTED
Barbiturates: NOT DETECTED
Cocaine: NOT DETECTED
OPIATES: NOT DETECTED
Tetrahydrocannabinol: NOT DETECTED

## 2015-11-10 LAB — LACTIC ACID, PLASMA: Lactic Acid, Venous: 1.2 mmol/L (ref 0.5–2.0)

## 2015-11-10 LAB — HIV ANTIBODY (ROUTINE TESTING W REFLEX): HIV SCREEN 4TH GENERATION: NONREACTIVE

## 2015-11-10 LAB — MAGNESIUM: Magnesium: 1.8 mg/dL (ref 1.7–2.4)

## 2015-11-10 LAB — MRSA PCR SCREENING: MRSA BY PCR: NEGATIVE

## 2015-11-10 LAB — PROCALCITONIN: PROCALCITONIN: 0.21 ng/mL

## 2015-11-10 MED ORDER — POTASSIUM CHLORIDE CRYS ER 20 MEQ PO TBCR
40.0000 meq | EXTENDED_RELEASE_TABLET | Freq: Once | ORAL | Status: AC
  Administered 2015-11-10: 40 meq via ORAL
  Filled 2015-11-10: qty 2

## 2015-11-10 NOTE — Evaluation (Signed)
Occupational Therapy Evaluation Patient Details Name: Stephanie Huber MRN: [id number] DOB: 1966-06-10 Today's Date: 11/10/2015    History of Present Illness this 50 y.o. female admitted with AMS and generalized weakness.  Dx:  DKA, acute encephalopathy, SIRS.  PMH includes DM-1, HTN, CKD-III   Clinical Impression   Pt admitted with above. She demonstrates the below listed deficits and will benefit from continued OT to maximize safety and independence with BADLs.  Pt presents to OT with generalized weakness, decreased balance, and mild cognitive deficits.  Currently, she requires min A for ADLs.  She lives at home with her 41 y.o. Daughter and was independent PTA.  Will follow acutely to allow her to return home mod I at discharge.       Follow Up Recommendations  No OT follow up;Supervision - Intermittent    Equipment Recommendations  None recommended by OT    Recommendations for Other Services       Precautions / Restrictions Precautions Precautions: Fall      Mobility Bed Mobility Overal bed mobility: Needs Assistance Bed Mobility: Supine to Sit     Supine to sit: Supervision        Transfers Overall transfer level: Needs assistance   Transfers: Sit to/from Stand;Stand Pivot Transfers Sit to Stand: Min assist Stand pivot transfers: Min assist       General transfer comment: min A for balance     Balance Overall balance assessment: Needs assistance Sitting-balance support: Feet supported Sitting balance-Leahy Scale: Good     Standing balance support: Single extremity supported Standing balance-Leahy Scale: Poor Standing balance comment: requires min A                            ADL Overall ADL's : Needs assistance/impaired Eating/Feeding: Independent   Grooming: Wash/dry hands;Wash/dry face;Oral care;Brushing hair;Minimal assistance;Standing   Upper Body Bathing: Set up;Sitting   Lower Body Bathing: Minimal assistance;Sit to/from  stand   Upper Body Dressing : Sitting;Set up   Lower Body Dressing: Minimal assistance;Sit to/from stand   Toilet Transfer: Minimal assistance;Ambulation;Comfort height toilet;Regular Toilet;RW   Toileting- Clothing Manipulation and Hygiene: Minimal assistance;Sit to/from stand       Functional mobility during ADLs: Minimal assistance General ADL Comments: Pt requires min A for balance      Vision     Perception     Praxis      Pertinent Vitals/Pain Pain Assessment: No/denies pain     Hand Dominance Right   Extremity/Trunk Assessment Upper Extremity Assessment Upper Extremity Assessment: Overall WFL for tasks assessed   Lower Extremity Assessment Lower Extremity Assessment: Defer to PT evaluation   Cervical / Trunk Assessment Cervical / Trunk Assessment: Normal   Communication Communication Communication: No difficulties   Cognition Arousal/Alertness: Awake/alert Behavior During Therapy: Flat affect Overall Cognitive Status: Impaired/Different from baseline Area of Impairment: Problem solving             Problem Solving: Slow processing General Comments: Pt with flat affect and a bit slow to respond.  No family present to provide info re: baseline    General Comments       Exercises       Shoulder Instructions      Home Living Family/patient expects to be discharged to:: Private residence Living Arrangements: Children (36 y.o. daughter ) Available Help at Discharge: Family Type of Home: House Home Access: Level entry;Stairs to enter Entrance Stairs-Number of Steps: 3 Entrance Stairs-Rails: None Home  Layout: One level     Bathroom Shower/Tub: Tub only   Firefighter: Standard         Additional Comments: Pt reports her cousins can check on her and assist as needed       Prior Functioning/Environment Level of Independence: Independent        Comments: Pt does not work, and does not drive.  She reports she uses public  transportation.  She denies h/o falls     OT Diagnosis: Generalized weakness;Cognitive deficits   OT Problem List: Decreased strength;Decreased activity tolerance;Impaired balance (sitting and/or standing);Decreased cognition;Decreased safety awareness;Cardiopulmonary status limiting activity   OT Treatment/Interventions: Self-care/ADL training;DME and/or AE instruction;Therapeutic activities;Cognitive remediation/compensation;Patient/family education;Balance training    OT Goals(Current goals can be found in the care plan section) Acute Rehab OT Goals Patient Stated Goal: to go home  OT Goal Formulation: With patient Time For Goal Achievement: 11/17/15 Potential to Achieve Goals: Good ADL Goals Pt Will Perform Grooming: with modified independence;sitting;standing Pt Will Perform Upper Body Bathing: with modified independence;sitting;standing Pt Will Perform Lower Body Bathing: with modified independence;sit to/from stand Pt Will Perform Upper Body Dressing: with modified independence;sitting;standing Pt Will Perform Lower Body Dressing: with modified independence;sit to/from stand Pt Will Transfer to Toilet: with modified independence;ambulating;regular height toilet;bedside commode;grab bars Pt Will Perform Toileting - Clothing Manipulation and hygiene: with modified independence;sit to/from stand Pt Will Perform Tub/Shower Transfer: Tub transfer;with modified independence;ambulating  OT Frequency: Min 2X/week   Barriers to D/C:            Co-evaluation              End of Session Nurse Communication: Mobility status  Activity Tolerance: Patient tolerated treatment well Patient left: in chair;with call bell/phone within reach;with chair alarm set   Time: 1610-9604 OT Time Calculation (min): 15 min Charges:  OT General Charges $OT Visit: 1 Procedure OT Evaluation $OT Eval Moderate Complexity: 1 Procedure G-Codes:    Leahanna Buser M Dec 04, 2015, 10:24 AM

## 2015-11-10 NOTE — Progress Notes (Signed)
Inpatient Diabetes Program Recommendations  AACE/ADA: New Consensus Statement on Inpatient Glycemic Control (2015)  Target Ranges:  Prepandial:   less than 140 mg/dL      Peak postprandial:   less than 180 mg/dL (1-2 hours)      Critically ill patients:  140 - 180 mg/dL   Review of Glycemic Control  Diabetes history: DM1 Outpatient Diabetes medications: Lantus 25 units QD, Novolog 5 units tidwc Current orders for Inpatient glycemic control: GlucoStabilizer per DKA orders Results for NICOSHA, STRUVE (MRN [id number]) as of 11/10/2015 11:51  Ref. Range 11/10/2015 09:49  Sodium Latest Ref Range: 135-145 mmol/L 140  Potassium Latest Ref Range: 3.5-5.1 mmol/L 3.6  Chloride Latest Ref Range: 101-111 mmol/L 115 (H)  CO2 Latest Ref Range: 22-32 mmol/L 15 (L)  BUN Latest Ref Range: 6-20 mg/dL 6  Creatinine Latest Ref Range: 0.44-1.00 mg/dL 0.82  Calcium Latest Ref Range: 8.9-10.3 mg/dL 9.1  EGFR (Non-African Amer.) Latest Ref Range: >60 mL/min >60  EGFR (African American) Latest Ref Range: >60 mL/min >60  Glucose Latest Ref Range: 65-99 mg/dL 142 (H)  Anion gap Latest Ref Range: 5-15  10  Results for PAIDYN, MCFERRAN (MRN [id number]) as of 11/10/2015 11:51  Ref. Range 11/10/2015 07:55  Glucose-Capillary Latest Ref Range: 65-99 mg/dL 154 (H)  AG not closed. Continue insulin gtt.    Pt seemed a bit confused about her insulin regimen at home when I spoke with her. When asked what her blood sugars run at home, she answered "30-40." Then she said she didn't know. Did say she took Lantus 25 and Novolog 10 units tidwc instead of 5 units tid. States she goes regularly to [employer]. Does not know what HgbA1C is. Writer had seen pt back in June 2016 and HgbA1C was 18.3%. Definitely needs encouragement and support in taking care of her diabetes. Pending HgbA1C at present.  Inpatient Diabetes Program Recommendations:    Continue with GlucoStabilizer until DKA resolved. Would transition to Lantus 20  units Q24H, Novolog sensitive tidwc and hs + 4 units tidwc for meal coverage insulin.  Will continue to follow. Thank you. Lorenda Peck, RD, LDN, CDE Inpatient Diabetes Coordinator [phone]

## 2015-11-10 NOTE — Progress Notes (Signed)
Progress Note   Stephanie Huber OZH:[id number] DOB: Jan 28, 1966 DOA: 2015-11-18 PCP: Dorrene German, MD   Brief Narrative:   Stephanie Huber is an 50 y.o. female with a PMH of type 1 diabetes, hypertension, stage III chronic kidney disease, and prior episodes of DKA who was admitted 2015/11/18 with chief complaint of generalized weakness for 2 days, followed by nausea, vomiting, and loose stools. Upon initial evaluation in the ED, she was found to be confused with a blood glucose of 552, a bicarbonate of less than 7, and an anion gap greater than 21. Her serum lactate was 2.5. She was also hypothermic with a temperature of 96.85F.  Assessment/Plan:   Principal Problem:   DKA (diabetic ketoacidoses) (HCC) - Hydrated overnight and kept on insulin drip per Glucomander protocol. - Blood glucose 217 with an anion gap of 16 this morning. Continue insulin drip until anion gap closes. - Continue to monitor electrolytes closely and replace as needed. - CBGs 136-190 overnight. - Advance diet to carb mod.  Active Problems:   Acute kidney injury, resolving - AKI likely prerenal and related to osmotic diuresis. Creatinine improving with hydration.    Hypothermia/SIRS (HCC) - No obvious source of infection. Follow-up HIV screening. - Urinalysis negative for nitrite/leukocytes. CXR clear. - Lactate cleared with aggressive IV fluids. Pro calcitonin 0.21.    Acute encephalopathy - Likely metabolic given DKA. Treat underlying DKA and monitor.    Type 1 diabetes mellitus with stage 3 chronic kidney disease (HCC) - Has been poorly controlled in the past. Follow-up hemoglobin A1c.    Essential hypertension - Currently being managed with IV hydralazine as needed. Will likely need treatment at discharge.    DVT Prophylaxis - Lovenox ordered.   Family Communication/Anticipated D/C date and plan/Code Status   Family Communication: No family at the bedside. Disposition Plan: Home when DKA  resolved. Anticipated D/C date:  11/11/15. Code Status: Full code.   IV Access:    Peripheral IV   Procedures and diagnostic studies:   Dg Chest Port 1 View  11-18-15  CLINICAL DATA:  [facility] pt has hyperglycemia and decreased loc today. Pt is disoriented. Pt reports nausea yesterday. Diabetic. Non-smoker. EXAM: PORTABLE CHEST 1 VIEW COMPARISON:  09/21/2006 FINDINGS: The heart size and mediastinal contours are within normal limits. Both lungs are clear. No pleural effusion or pneumothorax. The visualized skeletal structures are unremarkable. IMPRESSION: No active disease. Electronically Signed   By: Amie Portland M.D.   On: 11-18-15 19:53     Medical Consultants:    None.  Anti-Infectives:   Anti-infectives    None      Subjective:    Stephanie Huber is sitting up in the chair.  Denies N/V/D.  No dyspnea.  Has a mild cough.  Wants to eat.  Objective:    Filed Vitals:   11/10/15 0100 11/10/15 0200 11/10/15 0400 11/10/15 0600  BP: 144/74 129/60 126/61 140/96  Pulse: 103 104 106 109  Temp:   98.8 F (37.1 C)   TempSrc:   Oral   Resp: 21 37 18 24  Height:      Weight:      SpO2: 100% 100% 100% 100%    Intake/Output Summary (Last 24 hours) at 11/10/15 6295 Last data filed at 11/10/15 0600  Gross per 24 hour  Intake    875 ml  Output    200 ml  Net    675 ml   [employer]  11/09/15 2324  Weight: 82.8 kg (182 lb 8.7 oz)    Exam: Gen:  NAD Cardiovascular:  Tachy, No M/R/G Respiratory:  Lungs CTAB Gastrointestinal:  Abdomen soft, NT/ND, + BS Extremities:  No C/E/C   Data Reviewed:    Labs: Basic Metabolic Panel:  Recent Labs Lab 11/09/15 1803 11/09/15 2033 11/09/15 2047 11/09/15 2332 11/10/15 0317  NA 130* 136 134* 137 137  K 5.0 4.7 5.8* 3.8 4.1  CL 102 108 110 111 111  CO2 <7* <7*  --  <7* 10*  GLUCOSE 552* 451* 456* 286* 217*  BUN CREATININE 1.35* 1.20* 0.80 1.11* 1.05*  CALCIUM 8.2* 8.2*  --  8.2* 8.3*  MG   --   --   --  1.8  --    GFR Estimated Creatinine Clearance: 74 mL/min (by C-G formula based on Cr of 1.05). Liver Function Tests: No results for input(s): AST, ALT, ALKPHOS, BILITOT, PROT, ALBUMIN in the last 168 hours. No results for input(s): LIPASE, AMYLASE in the last 168 hours. No results for input(s): AMMONIA in the last 168 hours. Coagulation profile  Recent Labs Lab 11/09/15 2332  INR 1.31    CBC:  Recent Labs Lab 11/09/15 1803 11/09/15 2047  WBC 10.5  --   NEUTROABS 8.4*  --   HGB 14.4 16.7*  HCT 44.8 49.0*  MCV 83.1  --   PLT 277  --    CBG:  Recent Labs Lab 11/10/15 0251 11/10/15 0401 11/10/15 0501 11/10/15 0558 11/10/15 0656  GLUCAP 186* 174* 190* 165* 136*   Sepsis Labs:  Recent Labs Lab 11/09/15 1803 11/09/15 1812 11/09/15 2331 11/09/15 2332  PROCALCITON  --   --   --  0.21  WBC 10.5  --   --   --   LATICACIDVEN  --  2.50* 1.2  --    Microbiology Recent Results (from the past 240 hour(s))  MRSA PCR Screening     Status: None   Collection Time: 11/09/15 11:21 PM  Result Value Ref Range Status   MRSA by PCR NEGATIVE NEGATIVE Final    Comment:        The GeneXpert MRSA Assay (FDA approved for NASAL specimens only), is one component of a comprehensive MRSA colonization surveillance program. It is not intended to diagnose MRSA infection nor to guide or monitor treatment for MRSA infections.      Medications:   . enoxaparin (LOVENOX) injection  40 mg Subcutaneous QHS   Continuous Infusions: . sodium chloride Stopped (11/09/15 2354)  . sodium chloride    . dextrose 5 % and 0.45% NaCl 125 mL/hr at 11/09/15 2354  . insulin (NOVOLIN-R) infusion 4.3 Units/hr (11/10/15 0658)    Time spent: 35 minutes.  The patient is medically complex with multiple co-morbidities and is at high risk for clinical deterioration and requires high complexity decision making.    LOS: 1 day   Tabytha Gradillas  [facility] Pager [phone]. If  unable to reach me by pager, please call my cell phone at [phone].  *Please refer to [email or web address], password [email or web address] to get updated schedule on who will round on this patient, as hospitalists switch teams weekly. If 7PM-7AM, please contact night-coverage at [email or web address], password [email or web address] for any overnight needs.  11/10/2015, 7:12 AM

## 2015-11-10 NOTE — Evaluation (Signed)
Physical Therapy Evaluation Patient Details Name: Stephanie Huber MRN: [id number] DOB: [birth year] Today's Date: 11/10/2015   History of Present Illness  this 50 y.o. female admitted with AMS and generalized weakness.  Dx:  DKA, acute encephalopathy, SIRS.  PMH includes DM-1, HTN, CKD-III  Clinical Impression  The patient was sluggish and unbalanced without UE support during ambulation. Pt admitted with above diagnosis. Pt currently with functional limitations due to the deficits listed below (see PT Problem List).  Pt will benefit from skilled PT to increase their independence and safety with mobility to allow discharge to home. May not need HHPT at DC.        Home health PT;Supervision - Intermittent    Equipment Recommendations  None recommended by PT    Recommendations for Other Services       Precautions / Restrictions Precautions Precautions: Fall      Mobility  Bed Mobility   Bed Mobility: Supine to Sit     Supine to sit: Supervision        Transfers Overall transfer level: Needs assistance   Transfers: Sit to/from Stand Sit to Stand: Min assist Stand pivot transfers: Min assist       General transfer comment: min A for balance   Ambulation/Gait Ambulation/Gait assistance: Min assist Ambulation Distance (Feet): 80 Feet Assistive device: 1 person hand held assist Gait Pattern/deviations: Step-through pattern;Drifts right/left     General Gait Details: slow  Stairs            Wheelchair Mobility    Modified Rankin (Stroke Patients Only)       Balance   Sitting-balance support: Feet supported;No upper extremity supported Sitting balance-Leahy Scale: Good     Standing balance support: Single extremity supported Standing balance-Leahy Scale: Poor Standing balance comment: requires min assist for balance, slow responsesd                             Pertinent Vitals/Pain Pain Assessment: No/denies pain    Home Living  Family/patient expects to be discharged to:: Private residence Living Arrangements: Children Available Help at Discharge: Family Type of Home: House Home Access: Level entry;Stairs to enter Entrance Stairs-Rails: None Entrance Stairs-Number of Steps: 3 Home Layout: One level Home Equipment: None Additional Comments: Pt reports her cousins can check on her and assist as needed     Prior Function Level of Independence: Independent         Comments: Pt does not work, and does not drive.  She reports she uses public transportation.  She denies h/o falls      Hand Dominance   Dominant Hand: Right    Extremity/Trunk Assessment   Upper Extremity Assessment: Defer to OT evaluation           Lower Extremity Assessment: Generalized weakness      Cervical / Trunk Assessment: Normal  Communication   Communication: No difficulties  Cognition Arousal/Alertness: Awake/alert Behavior During Therapy: Flat affect Overall Cognitive Status: Impaired/Different from baseline Area of Impairment: Problem solving             Problem Solving: Slow processing General Comments: Pt with flat affect and a bit slow to respond.  No family present to provide info re: baseline     General Comments      Exercises        Assessment/Plan    PT Assessment Patient needs continued PT services  PT Diagnosis Difficulty walking;Generalized weakness  PT Problem List Decreased strength;Decreased activity tolerance;Decreased balance;Decreased mobility;Decreased knowledge of precautions  PT Treatment Interventions DME instruction;Gait training;Functional mobility training;Therapeutic activities;Stair training;Therapeutic exercise;Balance training;Patient/family education   PT Goals (Current goals can be found in the Care Plan section) Acute Rehab PT Goals Patient Stated Goal: to go home  PT Goal Formulation: With patient Time For Goal Achievement: 11/24/15 Potential to Achieve Goals:  Good    Frequency Min 3X/week   Barriers to discharge        Co-evaluation  yes, for patient therapist safety Mobility and ADLs.             End of Session   Activity Tolerance: Patient limited by fatigue Patient left: in chair;with call bell/phone within reach;with chair alarm set Nurse Communication: Mobility status         Time: 1610-9604 PT Time Calculation (min) (ACUTE ONLY): 15 min   Charges:   PT Evaluation $PT Eval Low Complexity: 1 Procedure     PT G CodesRada Hay 11/10/2015, 2:17 PM Blanchard Kelch PT [phone]

## 2015-11-10 NOTE — Care Management Note (Signed)
Case Management Note  Patient Details  Name: KYLAN VEACH MRN: [id number] Date of Birth: [birth year]  Subjective/Objective:         Diabetic ketoacidosis            Action/Plan: Date: November 10, 2015 Chart reviewed for concurrent status and case management needs. Will continue to follow patient for changes and needs: Marcelle Smiling, BSN, RN, [place]   [phone]   Expected Discharge Date:   (unknown)               Expected Discharge Plan:  Home/Self Care  In-House Referral:  NA  Discharge planning Services  CM Consult  Post Acute Care Choice:  NA Choice offered to:  NA  DME Arranged:    DME Agency:     HH Arranged:    HH Agency:     Status of Service:  In process, will continue to follow  Medicare Important Message Given:    Date Medicare IM Given:    Medicare IM give by:    Date Additional Medicare IM Given:    Additional Medicare Important Message give by:     If discussed at Long Length of Stay Meetings, dates discussed:    Additional Comments:  Golda Acre, RN 11/10/2015, 8:17 AM

## 2015-11-11 LAB — CBC
HCT: 33.4 % — ABNORMAL LOW (ref 36.0–46.0)
Hemoglobin: 11 g/dL — ABNORMAL LOW (ref 12.0–15.0)
MCH: 26.1 pg (ref 26.0–34.0)
MCHC: 32.9 g/dL (ref 30.0–36.0)
MCV: 79.1 fL (ref 78.0–100.0)
PLATELETS: 212 10*3/uL (ref 150–400)
RBC: 4.22 MIL/uL (ref 3.87–5.11)
RDW: 14 % (ref 11.5–15.5)
WBC: 4.8 10*3/uL (ref 4.0–10.5)

## 2015-11-11 LAB — GLUCOSE, CAPILLARY
GLUCOSE-CAPILLARY: 190 mg/dL — AB (ref 65–99)
GLUCOSE-CAPILLARY: 122 mg/dL — AB (ref 65–99)
GLUCOSE-CAPILLARY: 215 mg/dL — AB (ref 65–99)
GLUCOSE-CAPILLARY: 158 mg/dL — AB (ref 65–99)
Glucose-Capillary: 166 mg/dL — ABNORMAL HIGH (ref 65–99)
Glucose-Capillary: 192 mg/dL — ABNORMAL HIGH (ref 65–99)
Glucose-Capillary: 179 mg/dL — ABNORMAL HIGH (ref 65–99)
Glucose-Capillary: 154 mg/dL — ABNORMAL HIGH (ref 65–99)
Glucose-Capillary: 169 mg/dL — ABNORMAL HIGH (ref 65–99)
Glucose-Capillary: 137 mg/dL — ABNORMAL HIGH (ref 65–99)
Glucose-Capillary: 196 mg/dL — ABNORMAL HIGH (ref 65–99)
Glucose-Capillary: 133 mg/dL — ABNORMAL HIGH (ref 65–99)

## 2015-11-11 LAB — COMPREHENSIVE METABOLIC PANEL
ALT: 16 U/L (ref 14–54)
AST: 15 U/L (ref 15–41)
Albumin: 2.9 g/dL — ABNORMAL LOW (ref 3.5–5.0)
Alkaline Phosphatase: 150 U/L — ABNORMAL HIGH (ref 38–126)
Anion gap: 8 (ref 5–15)
BILIRUBIN TOTAL: 0.5 mg/dL (ref 0.3–1.2)
CHLORIDE: 114 mmol/L — AB (ref 101–111)
CO2: 16 mmol/L — ABNORMAL LOW (ref 22–32)
CREATININE: 0.73 mg/dL (ref 0.44–1.00)
Calcium: 8.9 mg/dL (ref 8.9–10.3)
GFR calc Af Amer: >60 mL/min (ref >60)
Glucose, Bld: 212 mg/dL — ABNORMAL HIGH (ref 65–99)
Potassium: 3.2 mmol/L — ABNORMAL LOW (ref 3.5–5.1)
Sodium: 138 mmol/L (ref 135–145)
Total Protein: 6.1 g/dL — ABNORMAL LOW (ref 6.5–8.1)

## 2015-11-11 LAB — BASIC METABOLIC PANEL
ANION GAP: 11 (ref 5–15)
CALCIUM: 8.8 mg/dL — AB (ref 8.9–10.3)
CO2: 12 mmol/L — ABNORMAL LOW (ref 22–32)
CREATININE: 0.79 mg/dL (ref 0.44–1.00)
Chloride: 114 mmol/L — ABNORMAL HIGH (ref 101–111)
GFR calc Af Amer: >60 mL/min (ref >60)
GLUCOSE: 178 mg/dL — AB (ref 65–99)
Potassium: 3.5 mmol/L (ref 3.5–5.1)
Sodium: 137 mmol/L (ref 135–145)

## 2015-11-11 MED ORDER — SODIUM CHLORIDE 0.9 % IV SOLN
INTRAVENOUS | Status: DC
  Administered 2015-11-11: 1000 mL via INTRAVENOUS

## 2015-11-11 MED ORDER — SODIUM CHLORIDE 0.9% FLUSH: 3.0000 mL | INTRAVENOUS | Status: DC | PRN

## 2015-11-11 MED ORDER — INSULIN ASPART 100 UNIT/ML ~~LOC~~ SOLN
4.0000 [IU] | Freq: Three times a day (TID) | SUBCUTANEOUS | Status: DC
  Administered 2015-11-11 – 2015-11-12 (×3): 4 [IU] via SUBCUTANEOUS

## 2015-11-11 MED ORDER — SODIUM CHLORIDE 0.9 % IV SOLN: 250.0000 mL | INTRAVENOUS | Status: DC | PRN

## 2015-11-11 MED ORDER — SODIUM CHLORIDE 0.9% FLUSH: 3.0000 mL | Freq: Two times a day (BID) | INTRAVENOUS | Status: DC

## 2015-11-11 MED ORDER — INSULIN GLARGINE 100 UNIT/ML ~~LOC~~ SOLN
25.0000 [IU] | SUBCUTANEOUS | Status: DC
  Administered 2015-11-11 – 2015-11-12 (×2): 25 [IU] via SUBCUTANEOUS
  Filled 2015-11-11 (×2): qty 0.25

## 2015-11-11 MED ORDER — POTASSIUM CHLORIDE CRYS ER 20 MEQ PO TBCR
40.0000 meq | EXTENDED_RELEASE_TABLET | Freq: Once | ORAL | Status: AC
  Administered 2015-11-11: 40 meq via ORAL
  Filled 2015-11-11: qty 2

## 2015-11-11 MED ORDER — INSULIN ASPART 100 UNIT/ML ~~LOC~~ SOLN
0.0000 [IU] | Freq: Three times a day (TID) | SUBCUTANEOUS | Status: DC
  Administered 2015-11-11 – 2015-11-12 (×3): 3 [IU] via SUBCUTANEOUS

## 2015-11-11 MED ORDER — INSULIN ASPART 100 UNIT/ML ~~LOC~~ SOLN: 0.0000 [IU] | Freq: Every day | SUBCUTANEOUS | Status: DC

## 2015-11-11 MED ORDER — INSULIN ASPART 100 UNIT/ML ~~LOC~~ SOLN
0.0000 [IU] | Freq: Three times a day (TID) | SUBCUTANEOUS | Status: DC
  Administered 2015-11-11: 3 [IU] via SUBCUTANEOUS

## 2015-11-11 NOTE — Progress Notes (Signed)
Pt had company and said it was not a good time to visit. Will attempt to visit another time. Please page if assistance is needed prior to next visit. Chaplain Marjory Lies, M.Div.   11/11/15 1800  Clinical Encounter Type  Visited With Patient and family together

## 2015-11-11 NOTE — Progress Notes (Signed)
Pt alert x4, v/s stable, no c/o pain. Order to tx the pt to 1616, report given to Selena Batten RN who will resume care. Estill Dooms, RN 8:18 PM 11/11/2015

## 2015-11-11 NOTE — Progress Notes (Signed)
Progress Note   Stephanie Huber ZOX:[id number] DOB: 1965-12-30 DOA: 11-14-2015 PCP: Dorrene German, MD   Brief Narrative:   Stephanie Huber is an 50 y.o. female with a PMH of type 1 diabetes, hypertension, stage III chronic kidney disease, and prior episodes of DKA who was admitted 11-14-2015 with chief complaint of generalized weakness for 2 days, followed by nausea, vomiting, and loose stools. Upon initial evaluation in the ED, she was found to be confused with a blood glucose of 552, a bicarbonate of less than 7, and an anion gap greater than 21. Her serum lactate was 2.5. She was also hypothermic with a temperature of 96.47F.  Assessment/Plan:   Principal Problem:   DKA (diabetic ketoacidoses) (HCC) - Maintained on insulin drip per Glucomander protocol since admission. - Blood glucose 212 with an anion gap of 8 this morning. - Continue to monitor electrolytes closely, replace potassium. - CBGs 154-192 overnight. Transition to basal/bolus insulin. - DM coordinator consultation.  Active Problems:   Hypokalemia - Supplement.     Acute kidney injury, resolved - AKI likely prerenal and related to osmotic diuresis. Creatinine normalized with hydration.    Hypothermia/SIRS (HCC), resolved - Provided warmth with a bair hugger. - No obvious source of infection. HIV negative. Follow-up blood cultures. - Urinalysis negative for nitrite/leukocytes. CXR clear. - Lactate cleared with aggressive IV fluids. Pro calcitonin 0.21.    Acute encephalopathy, resolved - Likely metabolic given DKA. Resolved..    Type 1 diabetes mellitus with stage 3 chronic kidney disease (HCC) - Has been poorly controlled in the past. Follow-up hemoglobin A1c. - DM coordinator consultation.    Essential hypertension - Currently being managed with IV hydralazine as needed. Blood pressure reasonable.    DVT Prophylaxis - Lovenox ordered.   Family Communication/Anticipated D/C date and plan/Code  Status   Family Communication: No family at the bedside. Disposition Plan/date: Home when DKA resolved. Likely 11/12/15. Transfer to floor. Code Status: Full code.   IV Access:    Peripheral IV   Procedures and diagnostic studies:   Dg Chest Port 1 View  November 14, 2015  CLINICAL DATA:  [facility] pt has hyperglycemia and decreased loc today. Pt is disoriented. Pt reports nausea yesterday. Diabetic. Non-smoker. EXAM: PORTABLE CHEST 1 VIEW COMPARISON:  09/21/2006 FINDINGS: The heart size and mediastinal contours are within normal limits. Both lungs are clear. No pleural effusion or pneumothorax. The visualized skeletal structures are unremarkable. IMPRESSION: No active disease. Electronically Signed   By: Amie Portland M.D.   On: 2015-11-14 19:53     Medical Consultants:    None.  Anti-Infectives:   Anti-infectives    None      Subjective:   Stephanie Huber is sitting up in the chair.  Denies N/V/D.  No dyspnea or reports of pain. Says her bowels are moving.  Tolerating diet.  Objective:    Filed Vitals:   11/10/15 1600 11/10/15 2000 11/11/15 0100 11/11/15 0500  BP:  138/73  104/52  Pulse:    91  Temp: 97.6 F (36.4 C) 98 F (36.7 C) 98.3 F (36.8 C)   TempSrc: Oral Oral Oral   Resp:  18  18  Height:      Weight:      SpO2:    99%    Intake/Output Summary (Last 24 hours) at 11/11/15 0746 Last data filed at 11/10/15 0800  Gross per 24 hour  Intake    310 ml  Output  350 ml  Net    -40 ml   Filed Weights   11/09/15 2324  Weight: 82.8 kg (182 lb 8.7 oz)    Exam: Gen:  NAD Cardiovascular: Mildly tachycardico M/R/G Respiratory:  Lungs CTAB Gastrointestinal:  Abdomen soft, NT/ND, + BS Extremities:  No C/E/C   Data Reviewed:    Labs: Basic Metabolic Panel:  Recent Labs Lab 11/09/15 2332  11/10/15 0949 11/10/15 1331 11/10/15 2041 11/11/15 0010 11/11/15 0316  NA 137  < > 140 138 138 137 138  K 3.8  < > 3.6 3.6 3.4* 3.5 3.2*  CL 111  < > 115*  114* 114* 114* 114*  CO2 <7*  < > 15* 14* 15* 12* 16*  GLUCOSE 286*  < > 142* 193* 129* 178* 212*  BUN 8  < > 6 6 <5* <5* <5*  CREATININE 1.11*  < > 0.82 0.96 0.76 0.79 0.73  CALCIUM 8.2*  < > 9.1 8.8* 9.1 8.8* 8.9  MG 1.8  --   --   --   --   --   --   < > = values in this interval not displayed. GFR Estimated Creatinine Clearance: 97.1 mL/min (by C-G formula based on Cr of 0.73). Liver Function Tests:  Recent Labs Lab 11/11/15 0316  AST 15  ALT 16  ALKPHOS 150*  BILITOT 0.5  PROT 6.1*  ALBUMIN 2.9*   No results for input(s): LIPASE, AMYLASE in the last 168 hours. No results for input(s): AMMONIA in the last 168 hours. Coagulation profile  Recent Labs Lab 11/09/15 2332  INR 1.31    CBC:  Recent Labs Lab 11/09/15 1803 11/09/15 2047 11/11/15 0316  WBC 10.5  --  4.8  NEUTROABS 8.4*  --   --   HGB 14.4 16.7* 11.0*  HCT 44.8 49.0* 33.4*  MCV 83.1  --  79.1  PLT 277  --  212   CBG:  Recent Labs Lab 11/11/15 0140 11/11/15 0243 11/11/15 0340 11/11/15 0553 11/11/15 0659  GLUCAP 192* 190* 179* 169* 154*   Sepsis Labs:  Recent Labs Lab 11/09/15 1803 11/09/15 1812 11/09/15 2331 11/09/15 2332 11/11/15 0316  PROCALCITON  --   --   --  0.21  --   WBC 10.5  --   --   --  4.8  LATICACIDVEN  --  2.50* 1.2  --   --    Microbiology Recent Results (from the past 240 hour(s))  Blood culture (routine x 2)     Status: None (Preliminary result)   Collection Time: 11/09/15  8:22 PM  Result Value Ref Range Status   Specimen Description BLOOD RIGHT HAND  Final   Special Requests IN PEDIATRIC BOTTLE 3CC  Final   Culture   Final    NO GROWTH < 24 HOURS Performed at [facility]    Report Status PENDING  Incomplete  Blood culture (routine x 2)     Status: None (Preliminary result)   Collection Time: 11/09/15  8:25 PM  Result Value Ref Range Status   Specimen Description BLOOD RIGHT ARM  Final   Special Requests BOTTLES DRAWN AEROBIC AND ANAEROBIC 5CC  EACH  Final   Culture   Final    NO GROWTH < 24 HOURS Performed at [facility]    Report Status PENDING  Incomplete  MRSA PCR Screening     Status: None   Collection Time: 11/09/15 11:21 PM  Result Value Ref Range Status   MRSA by  PCR NEGATIVE NEGATIVE Final    Comment:        The GeneXpert MRSA Assay (FDA approved for NASAL specimens only), is one component of a comprehensive MRSA colonization surveillance program. It is not intended to diagnose MRSA infection nor to guide or monitor treatment for MRSA infections.      Medications:   . enoxaparin (LOVENOX) injection  40 mg Subcutaneous QHS   Continuous Infusions: . dextrose 5 % and 0.45% NaCl 75 mL/hr at 11/10/15 1536  . insulin (NOVOLIN-R) infusion 2.7 Units/hr (11/11/15 0554)    Time spent: 25 minutes.    LOS: 2 days   Bellamie Turney  [facility] Pager [phone]. If unable to reach me by pager, please call my cell phone at [phone].  *Please refer to [email or web address], password [email or web address] to get updated schedule on who will round on this patient, as hospitalists switch teams weekly. If 7PM-7AM, please contact night-coverage at [email or web address], password [email or web address] for any overnight needs.  11/11/2015, 7:46 AM

## 2015-11-12 LAB — BASIC METABOLIC PANEL
ANION GAP: 9 (ref 5–15)
BUN: <5 mg/dL — ABNORMAL LOW (ref 6–20)
CALCIUM: 8.8 mg/dL — AB (ref 8.9–10.3)
CHLORIDE: 112 mmol/L — AB (ref 101–111)
CO2: 22 mmol/L (ref 22–32)
CREATININE: 0.65 mg/dL (ref 0.44–1.00)
GFR calc Af Amer: >60 mL/min (ref >60)
GFR calc non Af Amer: >60 mL/min (ref >60)
Glucose, Bld: 160 mg/dL — ABNORMAL HIGH (ref 65–99)
Potassium: 3 mmol/L — ABNORMAL LOW (ref 3.5–5.1)
Sodium: 143 mmol/L (ref 135–145)

## 2015-11-12 LAB — GLUCOSE, CAPILLARY: GLUCOSE-CAPILLARY: 159 mg/dL — AB (ref 65–99)

## 2015-11-12 MED ORDER — POTASSIUM CHLORIDE CRYS ER 20 MEQ PO TBCR
40.0000 meq | EXTENDED_RELEASE_TABLET | ORAL | Status: AC
  Administered 2015-11-12 (×2): 40 meq via ORAL
  Filled 2015-11-12 (×2): qty 2

## 2015-11-12 NOTE — Discharge Instructions (Signed)
Diabetic Ketoacidosis °Diabetic ketoacidosis is a life-threatening complication of diabetes. If it is not treated, it can cause severe dehydration and organ damage and can lead to a coma or death. °CAUSES °This condition develops when there is not enough of the hormone insulin in the body. Insulin helps the body to break down sugar for energy. Without insulin, the body cannot break down sugar, so it breaks down fats instead. This leads to the production of acids that are called ketones. Ketones are poisonous at high levels. °This condition can be triggered by: °· Stress on the body that is brought on by an illness. °· Medicines that raise blood glucose levels. °· Not taking diabetes medicine. °SYMPTOMS °Symptoms of this condition include: °· Fatigue. °· Weight loss. °· Excessive thirst. °· Light-headedness. °· Fruity or sweet-smelling breath. °· Excessive urination. °· Vision changes. °· Confusion or irritability. °· Nausea. °· Vomiting. °· Rapid breathing. °· Abdominal pain. °· Feeling flushed. °DIAGNOSIS °This condition is diagnosed based on a medical history, a physical exam, and blood tests. You may also have a urine test that checks for ketones. °TREATMENT °This condition may be treated with: °· Fluid replacement. This may be done to correct dehydration. °· Insulin injections. These may be given through the skin or through an IV tube. °· Electrolyte replacement. Electrolytes, such as potassium and sodium, may be given in pill form or through an IV tube. °· Antibiotic medicines. These may be prescribed if your condition was caused by an infection. °HOME CARE INSTRUCTIONS °Eating and Drinking °· Drink enough fluids to keep your urine clear or pale yellow. °· If you cannot eat, alternate between drinking fluids with sugar (such as juice) and salty fluids (such as broth or bouillon). °· If you can eat, follow your usual diet and drink sugar-free liquids, such as water. °Other Instructions °· Take insulin as  directed by your health care provider. Do not skip insulin injections. Do not use expired insulin. °· If your blood sugar is over 240 mg/dL, monitor your urine ketones every 4-6 hours. °· If you were prescribed an antibiotic medicine, finish all of it even if you start to feel better. °· Rest and exercise only as directed by your health care provider. °· If you get sick, call your health care provider and begin treatment quickly. Your body often needs extra insulin to fight an illness. °· Check your blood glucose levels regularly. If your blood glucose is high, drink plenty of fluids. This helps to flush out ketones. °SEEK MEDICAL CARE IF: °· Your blood glucose level is too high or too low. °· You have ketones in your urine. °· You have a fever. °· You cannot eat. °· You cannot tolerate fluids. °· You have been vomiting for more than 2 hours. °· You continue to have symptoms of this condition. °· You develop new symptoms. °SEEK IMMEDIATE MEDICAL CARE IF: °· Your blood glucose levels continue to be high (elevated). °· Your monitor reads "high" even when you are taking insulin. °· You faint. °· You have chest pain. °· You have trouble breathing. °· You have a sudden, severe headache. °· You have sudden weakness in one arm or one leg. °· You have sudden trouble speaking or swallowing. °· You have vomiting or diarrhea that gets worse after 3 hours. °· You feel severely fatigued. °· You have trouble thinking. °· You have abdominal pain. °· You are severely dehydrated. Symptoms of severe dehydration include: °¨ Extreme thirst. °¨ Dry mouth. °¨ Blue lips. °¨   Cold hands and feet. °¨ Rapid breathing. °  °This information is not intended to replace advice given to you by your health care provider. Make sure you discuss any questions you have with your health care provider. °  °Document Released: 08/30/2000 Document Revised: 01/17/2015 Document Reviewed: 08/10/2014 °Elsevier Interactive Patient Education ©2016 [employer] ° °

## 2015-11-12 NOTE — Progress Notes (Signed)
Inpatient Diabetes Program Recommendations  AACE/ADA: New Consensus Statement on Inpatient Glycemic Control (2015)  Target Ranges:  Prepandial:   less than 140 mg/dL      Peak postprandial:   less than 180 mg/dL (1-2 hours)      Critically ill patients:  140 - 180 mg/dL   Review of Glycemic Control  Diabetes history: DM 1 Outpatient Diabetes medications: Lantus 25 units, Novolog 5 units TID with meals Current orders for Inpatient glycemic control: Lantus 25 units, Novolog Sensitive + HS + Novolog 4 units TID meal coverage  Inpatient Diabetes Program Recommendations:   Patient controlled well on current regimen. Will follow while inpatient.  Thanks,  Christena Deem RN, MSN, [facility] Inpatient Diabetes Coordinator Team Pager [phone] (8a-5p)

## 2015-11-12 NOTE — Care Management Important Message (Signed)
Important Message  Patient Details  Name: KYNDRA CONDRON MRN: [id number] Date of Birth: [birth year]   Medicare Important Message Given:  Yes    Elliot Cousin, RN 11/12/2015, 1:33 PM

## 2015-11-12 NOTE — Progress Notes (Signed)
Discharged from floor via w/c, belongings & family with pt. No changes in assessment. Stephanie Huber  

## 2015-11-12 NOTE — Care Management Note (Signed)
Case Management Note  Patient Details  Name: Stephanie Huber MRN: [id number] Date of Birth: 1966-07-26  Subjective/Objective:     DKA               Action/Plan: NCM spoke to pt and states she lives at home with family. States she can afford her medications. PT recommended HH PT. Pt declines HH PT at this time.   PCP-Dr Fleet Contras  Expected Discharge Date:   (unknown)               Expected Discharge Plan:  Home/Self Care  In-House Referral:  NA  Discharge planning Services  CM Consult  Post Acute Care Choice:  NA Choice offered to:  NA  DME Arranged:  N/A DME Agency:  NA  HH Arranged:  NA HH Agency:  NA  Status of Service:  Completed, signed off  Medicare Important Message Given:  Yes Date Medicare IM Given:    Medicare IM give by:    Date Additional Medicare IM Given:    Additional Medicare Important Message give by:     If discussed at Long Length of Stay Meetings, dates discussed:    Additional Comments:  Elliot Cousin, RN 11/12/2015, 1:45 PM

## 2015-11-12 NOTE — Discharge Summary (Signed)
Physician Discharge Summary  Stephanie Huber JGG:[id number] DOB: [birth year] DOA: 12-08-15  PCP: Philis Fendt, MD  Admit date: December 08, 2015 Discharge date: 11/12/2015   Recommendations for Outpatient Follow-Up:   1. F/U with PCP in 2 weeks to assess outpatient glycemic control. 2. F/U final blood culture results & hemoglobin A1c.   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    Discharge Condition: Improved.  Diet recommendation: Carbohydrate-modified.  Marland Kitchen   History of Present Illness:   Stephanie Huber is an 50 y.o. female with a PMH of type 1 diabetes, hypertension, stage III chronic kidney disease, and prior episodes of DKA who was admitted 12-08-2015 with chief complaint of generalized weakness for 2 days, followed by nausea, vomiting, and loose stools. Upon initial evaluation in the ED, she was found to be confused with a blood glucose of 552, a bicarbonate of less than 7, and an anion gap greater than 21. Her serum lactate was 2.5. She was also hypothermic with a temperature of 96.50F.  Hospital Course by Problem:   Principal Problem:  DKA (diabetic ketoacidoses) ([place]) - Resolved on insulin drip per Glucomander protocol. - Transitioned to basal/bolus insulin 11/11/15. - DM coordinator counseled and assessed.  Active Problems:  Hypokalemia - Supplemented prior to D/C.    Acute kidney injury, resolved - AKI likely prerenal and related to osmotic diuresis. Creatinine normalized with hydration.   Hypothermia/SIRS ([place]), resolved - Provided warmth with a bair hugger. - No obvious source of infection. HIV negative. Follow-up blood cultures. -  Urinalysis negative for nitrite/leukocytes. CXR clear. - Lactate cleared with aggressive IV fluids. Pro calcitonin 0.21.   Acute encephalopathy, resolved - Likely metabolic given DKA. Resolved..   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 Follow-up hemoglobin A1c.   Essential hypertension - Blood pressure reasonable.    Medical Consultants:    None.   Discharge Exam:   Filed Vitals:   11/11/15 2025 11/12/15 0521  BP:  145/83  Pulse:  101  Temp: 99.3 F (37.4 C) 98 F (36.7 C)  Resp:  15   Filed Vitals:   11/11/15 1800 11/11/15 1900 11/11/15 2025 11/12/15 0521  BP:    145/83  Pulse: 101 106  101  Temp:   99.3 F (37.4 C) 98 F (36.7 C)  TempSrc:   Oral Oral  Resp: '19 16  15  '$ Height:      Weight:      SpO2: 100% 100%  100%    Gen:  NAD Cardiovascular:  RRR, No M/R/G Respiratory: Lungs CTAB Gastrointestinal: Abdomen soft, NT/ND with normal active bowel sounds. Extremities: No C/E/C   The results of significant diagnostics from this hospitalization (including imaging, microbiology, ancillary and laboratory) are listed below for reference.     Procedures and Diagnostic Studies:   Dg Chest Port 1 View  2015/12/08  CLINICAL DATA:  Per [facility] pt has hyperglycemia and decreased loc today. Pt is disoriented. Pt reports nausea yesterday. Diabetic. Non-smoker. EXAM: PORTABLE CHEST 1 VIEW COMPARISON:  09/21/2006 FINDINGS: The heart size and mediastinal contours are within normal limits. Both  lungs are clear. No pleural effusion or pneumothorax. The visualized skeletal structures are unremarkable. IMPRESSION: No active disease. Electronically Signed   By: Lajean Manes M.D.   On: 11/09/2015 19:53     Labs:   Basic Metabolic Panel:  Recent Labs Lab 11/09/15 2332  11/10/15 1331 11/10/15 2041 11/11/15 0010 11/11/15 0316 11/12/15 0413  NA 137  < > 138 138 137 138 143  K 3.8  < > 3.6 3.4* 3.5  3.2* 3.0*  CL 111  < > 114* 114* 114* 114* 112*  CO2 <7*  < > 14* 15* 12* 16* 22  GLUCOSE 286*  < > 193* 129* 178* 212* 160*  BUN 8  < > 6 <5* <5* <5* <5*  CREATININE 1.11*  < > 0.96 0.76 0.79 0.73 0.65  CALCIUM 8.2*  < > 8.8* 9.1 8.8* 8.9 8.8*  MG 1.8  --   --   --   --   --   --   < > = values in this interval not displayed. GFR Estimated Creatinine Clearance: 97.1 mL/min (by C-G formula based on Cr of 0.65). Liver Function Tests:  Recent Labs Lab 11/11/15 0316  AST 15  ALT 16  ALKPHOS 150*  BILITOT 0.5  PROT 6.1*  ALBUMIN 2.9*   Coagulation profile  Recent Labs Lab 11/09/15 2332  INR 1.31    CBC:  Recent Labs Lab 11/09/15 1803 11/09/15 2047 11/11/15 0316  WBC 10.5  --  4.8  NEUTROABS 8.4*  --   --   HGB 14.4 16.7* 11.0*  HCT 44.8 49.0* 33.4*  MCV 83.1  --  79.1  PLT 277  --  212   CBG:  Recent Labs Lab 11/11/15 1007 11/11/15 1152 11/11/15 1250 11/11/15 2250 11/12/15 0749  GLUCAP 196* 215* 158* 133* 159*   Lipid Profile  Recent Labs  11/10/15 0650  CHOL 192  HDL 39*  LDLCALC 131*  TRIG 108  CHOLHDL 4.9   Microbiology Recent Results (from the past 240 hour(s))  Blood culture (routine x 2)     Status: None (Preliminary result)   Collection Time: 11/09/15  8:22 PM  Result Value Ref Range Status   Specimen Description BLOOD RIGHT HAND  Final   Special Requests IN PEDIATRIC BOTTLE 3CC  Final   Culture   Final    NO GROWTH 3 DAYS Performed at [facility]    Report Status PENDING  Incomplete  Blood culture (routine x 2)     Status: None (Preliminary result)   Collection Time: 11/09/15  8:25 PM  Result Value Ref Range Status   Specimen Description BLOOD RIGHT ARM  Final   Special Requests BOTTLES DRAWN AEROBIC AND ANAEROBIC 5CC EACH  Final   Culture   Final    NO GROWTH 3 DAYS Performed at [facility]    Report Status PENDING  Incomplete  MRSA PCR Screening     Status: None   Collection Time: 11/09/15 11:21 PM    Result Value Ref Range Status   MRSA by PCR NEGATIVE NEGATIVE Final    Comment:        The GeneXpert MRSA Assay (FDA approved for NASAL specimens only), is one component of a comprehensive MRSA colonization surveillance program. It is not intended to diagnose MRSA infection nor to guide or monitor treatment for MRSA infections.      Discharge Instructions:   Discharge Instructions    Call MD for:  extreme fatigue    Complete  by:  As directed      Call MD for:  persistant nausea and vomiting    Complete by:  As directed      Call MD for:  severe uncontrolled pain    Complete by:  As directed      Call MD for:  temperature >100.4    Complete by:  As directed      Diet Carb Modified    Complete by:  As directed      Increase activity slowly    Complete by:  As directed             Medication List    TAKE these medications        blood glucose meter kit and supplies Kit  Dispense based on patient and insurance preference. Use up to four times daily as directed. (FOR ICD-9 250.00, 250.01).     insulin aspart 100 UNIT/ML injection  Commonly known as:  novoLOG  Inject 5 Units into the skin 3 (three) times daily with meals.     insulin glargine 100 UNIT/ML injection  Commonly known as:  LANTUS  Inject 0.25 mLs (25 Units total) into the skin daily.     Insulin Syringes (Disposable) U-100 1 ML Misc  Use as directed           Follow-up Information    Follow up with Philis Fendt, MD. Schedule an appointment as soon as possible for a visit in 1 week.   Specialty:  Internal Medicine   Why:  For follow up of control of your diabetes.   Contact information:   [address] [place] [address] [phone]        Time coordinating discharge: 35 minutes.  Signed:  RAMA,CHRISTINA  Pager [phone] [facility] 11/12/2015, 4:24 PM

## 2015-11-13 LAB — HEMOGLOBIN A1C

## 2015-11-13 MED FILL — LANTUS 100 UNITS/ML VIAL: 100 | 28 days supply | Qty: 10 | Fill #7

## 2015-11-13 MED FILL — NovoLOG 100 UNIT/ML SOLN: 100 | 66 days supply | Qty: 10 | Fill #3

## 2015-11-13 MED FILL — BD INSULIN SYR 0.5 ML 30GX1: 30G X 1/2" | 26 days supply | Qty: 100 | Fill #7

## 2015-11-14 LAB — CULTURE, BLOOD (ROUTINE X 2)
Culture: NO GROWTH
Culture: NO GROWTH

## 2015-11-15 LAB — GLUCOSE, CAPILLARY
Glucose-Capillary: 161 mg/dL — ABNORMAL HIGH (ref 65–99)
Glucose-Capillary: 208 mg/dL — ABNORMAL HIGH (ref 65–99)

## 2015-12-07 MED FILL — LANTUS 100 UNITS/ML VIAL: 100 | 28 days supply | Qty: 10 | Fill #8

## 2015-12-25 MED FILL — BD INSULIN SYR 0.5 ML 30GX1: 30G X 1/2" | 26 days supply | Qty: 100 | Fill #8

## 2016-02-08 MED FILL — LANTUS 100 UNITS/ML VIAL: 100 | 28 days supply | Qty: 10 | Fill #9

## 2016-02-08 MED FILL — BD INSULIN SYR 0.5 ML 30GX1: 30G X 1/2" | 26 days supply | Qty: 100 | Fill #9

## 2016-06-18 ENCOUNTER — Other Ambulatory Visit: Payer: Self-pay | Admitting: Internal Medicine

## 2016-06-18 DIAGNOSIS — Z1231 Encounter for screening mammogram for malignant neoplasm of breast: Secondary | ICD-10-CM

## 2016-08-21 ENCOUNTER — Ambulatory Visit
Admission: RE | Admit: 2016-08-21 | Discharge: 2016-08-21 | Disposition: A | Discharge status: Home / Self Care | Payer: Medicare Other | Source: Ambulatory Visit | Attending: Internal Medicine | Admitting: Internal Medicine

## 2016-08-21 DIAGNOSIS — Z1231 Encounter for screening mammogram for malignant neoplasm of breast: Secondary | ICD-10-CM

## 2016-08-23 ENCOUNTER — Other Ambulatory Visit: Payer: Self-pay | Admitting: Internal Medicine

## 2016-08-23 DIAGNOSIS — R928 Other abnormal and inconclusive findings on diagnostic imaging of breast: Secondary | ICD-10-CM

## 2016-09-04 ENCOUNTER — Other Ambulatory Visit: Payer: Medicare Other

## 2016-09-12 ENCOUNTER — Ambulatory Visit
Admission: RE | Admit: 2016-09-12 | Discharge: 2016-09-12 | Disposition: A | Discharge status: Home / Self Care | Payer: Medicare Other | Source: Ambulatory Visit | Attending: Internal Medicine | Admitting: Internal Medicine

## 2016-09-12 DIAGNOSIS — R928 Other abnormal and inconclusive findings on diagnostic imaging of breast: Secondary | ICD-10-CM

## 2017-03-08 ENCOUNTER — Encounter (HOSPITAL_COMMUNITY): Payer: Self-pay

## 2017-03-08 ENCOUNTER — Emergency Department (HOSPITAL_COMMUNITY): Payer: Medicare Other

## 2017-03-08 ENCOUNTER — Inpatient Hospital Stay (HOSPITAL_COMMUNITY)
Admission: EM | Admit: 2017-03-08 | Discharge: 2017-03-13 | DRG: 637 | Disposition: A | Discharge status: Home / Self Care | Payer: Medicare Other | Attending: Internal Medicine | Admitting: Internal Medicine

## 2017-03-08 DIAGNOSIS — E860 Dehydration: Secondary | ICD-10-CM | POA: Diagnosis present

## 2017-03-08 DIAGNOSIS — E872 Acidosis, unspecified: Secondary | ICD-10-CM | POA: Diagnosis present

## 2017-03-08 DIAGNOSIS — R531 Weakness: Secondary | ICD-10-CM | POA: Diagnosis not present

## 2017-03-08 DIAGNOSIS — I129 Hypertensive chronic kidney disease with stage 1 through stage 4 chronic kidney disease, or unspecified chronic kidney disease: Secondary | ICD-10-CM | POA: Diagnosis present

## 2017-03-08 DIAGNOSIS — T383X6A Underdosing of insulin and oral hypoglycemic [antidiabetic] drugs, initial encounter: Secondary | ICD-10-CM | POA: Diagnosis present

## 2017-03-08 DIAGNOSIS — E1110 Type 2 diabetes mellitus with ketoacidosis without coma: Secondary | ICD-10-CM | POA: Diagnosis present

## 2017-03-08 DIAGNOSIS — R739 Hyperglycemia, unspecified: Secondary | ICD-10-CM

## 2017-03-08 DIAGNOSIS — E1022 Type 1 diabetes mellitus with diabetic chronic kidney disease: Secondary | ICD-10-CM | POA: Diagnosis present

## 2017-03-08 DIAGNOSIS — Z794 Long term (current) use of insulin: Secondary | ICD-10-CM

## 2017-03-08 DIAGNOSIS — G9340 Encephalopathy, unspecified: Secondary | ICD-10-CM | POA: Diagnosis not present

## 2017-03-08 DIAGNOSIS — D72829 Elevated white blood cell count, unspecified: Secondary | ICD-10-CM | POA: Diagnosis present

## 2017-03-08 DIAGNOSIS — E440 Moderate protein-calorie malnutrition: Secondary | ICD-10-CM | POA: Insufficient documentation

## 2017-03-08 DIAGNOSIS — F17200 Nicotine dependence, unspecified, uncomplicated: Secondary | ICD-10-CM | POA: Diagnosis present

## 2017-03-08 DIAGNOSIS — N183 Chronic kidney disease, stage 3 unspecified: Secondary | ICD-10-CM

## 2017-03-08 DIAGNOSIS — Z818 Family history of other mental and behavioral disorders: Secondary | ICD-10-CM

## 2017-03-08 DIAGNOSIS — E10649 Type 1 diabetes mellitus with hypoglycemia without coma: Secondary | ICD-10-CM | POA: Diagnosis not present

## 2017-03-08 DIAGNOSIS — E13628 Other specified diabetes mellitus with other skin complications: Secondary | ICD-10-CM | POA: Diagnosis present

## 2017-03-08 DIAGNOSIS — I10 Essential (primary) hypertension: Secondary | ICD-10-CM | POA: Diagnosis present

## 2017-03-08 DIAGNOSIS — L03116 Cellulitis of left lower limb: Secondary | ICD-10-CM | POA: Diagnosis present

## 2017-03-08 DIAGNOSIS — R471 Dysarthria and anarthria: Secondary | ICD-10-CM | POA: Diagnosis present

## 2017-03-08 DIAGNOSIS — E1010 Type 1 diabetes mellitus with ketoacidosis without coma: Secondary | ICD-10-CM | POA: Diagnosis not present

## 2017-03-08 DIAGNOSIS — G9341 Metabolic encephalopathy: Secondary | ICD-10-CM | POA: Diagnosis present

## 2017-03-08 DIAGNOSIS — IMO0002 Reserved for concepts with insufficient information to code with codable children: Secondary | ICD-10-CM

## 2017-03-08 DIAGNOSIS — B9689 Other specified bacterial agents as the cause of diseases classified elsewhere: Secondary | ICD-10-CM | POA: Diagnosis present

## 2017-03-08 DIAGNOSIS — Y92009 Unspecified place in unspecified non-institutional (private) residence as the place of occurrence of the external cause: Secondary | ICD-10-CM

## 2017-03-08 DIAGNOSIS — Z681 Body mass index (BMI) 19 or less, adult: Secondary | ICD-10-CM

## 2017-03-08 DIAGNOSIS — E1065 Type 1 diabetes mellitus with hyperglycemia: Secondary | ICD-10-CM

## 2017-03-08 DIAGNOSIS — L0291 Cutaneous abscess, unspecified: Secondary | ICD-10-CM

## 2017-03-08 DIAGNOSIS — L02416 Cutaneous abscess of left lower limb: Secondary | ICD-10-CM | POA: Diagnosis present

## 2017-03-08 DIAGNOSIS — Z792 Long term (current) use of antibiotics: Secondary | ICD-10-CM

## 2017-03-08 DIAGNOSIS — E876 Hypokalemia: Secondary | ICD-10-CM | POA: Diagnosis present

## 2017-03-08 DIAGNOSIS — R4701 Aphasia: Secondary | ICD-10-CM | POA: Diagnosis present

## 2017-03-08 LAB — CBC
HEMATOCRIT: 42.4 % (ref 36.0–46.0)
HEMATOCRIT: 43 % (ref 36.0–46.0)
HEMOGLOBIN: 14 g/dL (ref 12.0–15.0)
Hemoglobin: 13.8 g/dL (ref 12.0–15.0)
MCH: 26.4 pg (ref 26.0–34.0)
MCH: 25.8 pg — AB (ref 26.0–34.0)
MCHC: 33 g/dL (ref 30.0–36.0)
MCHC: 32.1 g/dL (ref 30.0–36.0)
MCV: 79.8 fL (ref 78.0–100.0)
MCV: 80.4 fL (ref 78.0–100.0)
Platelets: 437 10*3/uL — ABNORMAL HIGH (ref 150–400)
Platelets: 312 10*3/uL (ref 150–400)
RBC: 5.31 MIL/uL — AB (ref 3.87–5.11)
RBC: 5.35 MIL/uL — ABNORMAL HIGH (ref 3.87–5.11)
RDW: 14.6 % (ref 11.5–15.5)
RDW: 14.6 % (ref 11.5–15.5)
WBC: 22.1 10*3/uL — ABNORMAL HIGH (ref 4.0–10.5)
WBC: 18.3 10*3/uL — ABNORMAL HIGH (ref 4.0–10.5)

## 2017-03-08 LAB — BASIC METABOLIC PANEL
Anion gap: 7 (ref 5–15)
BUN: 8 mg/dL (ref 6–20)
BUN: 6 mg/dL (ref 6–20)
CALCIUM: 8.1 mg/dL — AB (ref 8.9–10.3)
CALCIUM: 8.1 mg/dL — AB (ref 8.9–10.3)
CO2: <7 mmol/L — ABNORMAL LOW (ref 22–32)
CO2: 13 mmol/L — ABNORMAL LOW (ref 22–32)
Chloride: 111 mmol/L (ref 101–111)
Chloride: 111 mmol/L (ref 101–111)
Creatinine, Ser: 1.11 mg/dL — ABNORMAL HIGH (ref 0.44–1.00)
Creatinine, Ser: 0.76 mg/dL (ref 0.44–1.00)
GFR calc Af Amer: >60 mL/min (ref >60)
GFR calc Af Amer: >60 mL/min (ref >60)
GFR, EST NON AFRICAN AMERICAN: 57 mL/min — AB (ref >60)
GLUCOSE: 250 mg/dL — AB (ref 65–99)
GLUCOSE: 165 mg/dL — AB (ref 65–99)
Potassium: 4.4 mmol/L (ref 3.5–5.1)
Potassium: 3.2 mmol/L — ABNORMAL LOW (ref 3.5–5.1)
Sodium: 134 mmol/L — ABNORMAL LOW (ref 135–145)
Sodium: 131 mmol/L — ABNORMAL LOW (ref 135–145)

## 2017-03-08 LAB — I-STAT VENOUS BLOOD GAS, ED
Acid-base deficit: 27 mmol/L — ABNORMAL HIGH (ref 0.0–2.0)
Bicarbonate: 4.1 mmol/L — ABNORMAL LOW (ref 20.0–28.0)
O2 SAT: 34 %
PCO2 VEN: 19.7 mmHg — AB (ref 44.0–60.0)
pH, Ven: 6.923 — CL (ref 7.250–7.430)
pO2, Ven: 33 mmHg (ref 32.0–45.0)

## 2017-03-08 LAB — URINALYSIS, ROUTINE W REFLEX MICROSCOPIC
BILIRUBIN URINE: NEGATIVE
Glucose, UA: >=500 mg/dL — AB
KETONES UR: 80 mg/dL — AB
Leukocytes, UA: NEGATIVE
Nitrite: NEGATIVE
PROTEIN: 100 mg/dL — AB
SPECIFIC GRAVITY, URINE: 1.017 (ref 1.005–1.030)
pH: 5 (ref 5.0–8.0)

## 2017-03-08 LAB — COMPREHENSIVE METABOLIC PANEL
ALK PHOS: 303 U/L — AB (ref 38–126)
ALT: 12 U/L — AB (ref 14–54)
AST: 16 U/L (ref 15–41)
Albumin: 3.7 g/dL (ref 3.5–5.0)
BILIRUBIN TOTAL: 1.4 mg/dL — AB (ref 0.3–1.2)
BUN: 8 mg/dL (ref 6–20)
CALCIUM: 8.9 mg/dL (ref 8.9–10.3)
CO2: <7 mmol/L — ABNORMAL LOW (ref 22–32)
Chloride: 101 mmol/L (ref 101–111)
Creatinine, Ser: 1.09 mg/dL — ABNORMAL HIGH (ref 0.44–1.00)
GFR, EST NON AFRICAN AMERICAN: 58 mL/min — AB (ref >60)
GLUCOSE: 348 mg/dL — AB (ref 65–99)
Potassium: 4.2 mmol/L (ref 3.5–5.1)
Sodium: 133 mmol/L — ABNORMAL LOW (ref 135–145)
TOTAL PROTEIN: 9 g/dL — AB (ref 6.5–8.1)

## 2017-03-08 LAB — PROCALCITONIN: Procalcitonin: 1.45 ng/mL

## 2017-03-08 LAB — CREATININE, SERUM
CREATININE: 1.11 mg/dL — AB (ref 0.44–1.00)
GFR calc Af Amer: >60 mL/min (ref >60)
GFR, EST NON AFRICAN AMERICAN: 57 mL/min — AB (ref >60)

## 2017-03-08 LAB — GLUCOSE, CAPILLARY
GLUCOSE-CAPILLARY: 248 mg/dL — AB (ref 65–99)
GLUCOSE-CAPILLARY: 181 mg/dL — AB (ref 65–99)
Glucose-Capillary: 241 mg/dL — ABNORMAL HIGH (ref 65–99)
Glucose-Capillary: 214 mg/dL — ABNORMAL HIGH (ref 65–99)
Glucose-Capillary: 195 mg/dL — ABNORMAL HIGH (ref 65–99)

## 2017-03-08 LAB — CBG MONITORING, ED
GLUCOSE-CAPILLARY: 320 mg/dL — AB (ref 65–99)
GLUCOSE-CAPILLARY: 300 mg/dL — AB (ref 65–99)
GLUCOSE-CAPILLARY: 245 mg/dL — AB (ref 65–99)
Glucose-Capillary: 356 mg/dL — ABNORMAL HIGH (ref 65–99)

## 2017-03-08 LAB — I-STAT TROPONIN, ED: Troponin i, poc: 0.02 ng/mL (ref 0.00–0.08)

## 2017-03-08 LAB — MRSA PCR SCREENING: MRSA by PCR: NEGATIVE

## 2017-03-08 LAB — I-STAT CG4 LACTIC ACID, ED
LACTIC ACID, VENOUS: 2.78 mmol/L — AB (ref 0.5–1.9)
LACTIC ACID, VENOUS: 1.64 mmol/L (ref 0.5–1.9)

## 2017-03-08 MED ORDER — SODIUM CHLORIDE 0.9 % IV BOLUS (SEPSIS)
1000.0000 mL | Freq: Once | INTRAVENOUS | Status: AC
  Administered 2017-03-08: 1000 mL via INTRAVENOUS

## 2017-03-08 MED ORDER — ENOXAPARIN SODIUM 40 MG/0.4ML ~~LOC~~ SOLN
40.0000 mg | SUBCUTANEOUS | Status: DC
  Administered 2017-03-08 – 2017-03-12 (×5): 40 mg via SUBCUTANEOUS
  Filled 2017-03-08 (×5): qty 0.4

## 2017-03-08 MED ORDER — SODIUM CHLORIDE 0.9 % IV SOLN: INTRAVENOUS | Status: DC

## 2017-03-08 MED ORDER — DEXTROSE-NACL 5-0.45 % IV SOLN
INTRAVENOUS | Status: DC
  Administered 2017-03-08: 125 mL/h via INTRAVENOUS

## 2017-03-08 MED ORDER — SODIUM CHLORIDE 0.9 % IV SOLN
INTRAVENOUS | Status: AC
  Administered 2017-03-08: 999 mL/h via INTRAVENOUS

## 2017-03-08 MED ORDER — SODIUM CHLORIDE 0.9 % IV SOLN: INTRAVENOUS | Status: AC

## 2017-03-08 MED ORDER — SODIUM CHLORIDE 0.9 % IV SOLN
INTRAVENOUS | Status: DC
  Administered 2017-03-08: 150 mL/h via INTRAVENOUS

## 2017-03-08 MED ORDER — POTASSIUM CHLORIDE 10 MEQ/100ML IV SOLN
10.0000 meq | INTRAVENOUS | Status: AC
  Administered 2017-03-08: 10 meq via INTRAVENOUS
  Filled 2017-03-08: qty 100

## 2017-03-08 MED ORDER — SODIUM CHLORIDE 0.9 % IV SOLN
INTRAVENOUS | Status: DC
  Administered 2017-03-08: 1.9 [IU]/h via INTRAVENOUS
  Filled 2017-03-08: qty 1

## 2017-03-08 MED ORDER — DEXTROSE-NACL 5-0.45 % IV SOLN: INTRAVENOUS | Status: DC

## 2017-03-08 NOTE — ED Triage Notes (Signed)
Pt brought in by husband with slurred speech and weakness.  Started soon after eating breakfast.  Pt appears very weak.  Pt is a diabetic.  Pt states she has abdominal pain since yesterday and states she vomited this morning.  Husband not aware of these events.

## 2017-03-08 NOTE — ED Provider Notes (Addendum)
[place] DEPT Provider Note   CSN: [id number] Arrival date & time: 03/08/17  1247     History   Chief Complaint Chief Complaint  Patient presents with  . Weakness  . Aphasia    HPI KEMAYA DORNER is a 51 y.o. female.  Level V caveat for urgent need for intervention. Most of history obtained from husband. Husband reports normal behavior earlier today.  At approximately 11:30, he noted pt to have slurred speech and generalized weakness. He is unsure if she took her insulin this morning. No prodromal illnesses. No fever, chills,  meningeal signs.  She is a type I diabetic with additional history of SIRS, chronic kidney disease stage III, hypertension, DKA      Past Medical History:  Diagnosis Date  . CKD (chronic kidney disease), stage III   . Essential hypertension   . Type 1 diabetes mellitus [facility])     Patient Active Problem List   Diagnosis Date Noted  . Acute kidney injury ([place]) 11/10/2015  . Hypothermia 11/09/2015  . Acute encephalopathy 11/09/2015  . SIRS (systemic inflammatory response syndrome) ([place]) 11/09/2015  . CKD (chronic kidney disease), stage III   . 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 Essential hypertension   . DKA (diabetic ketoacidoses) ([place]) 02/18/2015  . Metabolic acidosis 01/75/1025    History reviewed. No pertinent surgical history.  OB History    No data available       Home Medications    Prior to Admission medications   Medication Sig Start Date End Date Taking? Authorizing Provider  blood glucose meter kit and supplies KIT Dispense based on patient and insurance preference. Use up to four times daily as directed. (FOR ICD-9 250.00, 250.01). 02/21/15   Rai, Ripudeep K, MD  insulin aspart (NOVOLOG) 100 UNIT/ML injection Inject 5 Units into the skin 3 (three) times daily with meals. 02/21/15   Rai, Ripudeep K, MD  insulin glargine (LANTUS) 100 UNIT/ML injection Inject 0.25 mLs (25 Units total) into  the skin daily. 02/21/15   Mendel Corning, MD  Insulin Syringes, Disposable, U-100 1 ML MISC Use as directed 02/21/15   Rai, Vernelle Emerald, MD    Family History Family History  Problem Relation Age of Onset  . Dementia Unknown     Social History Social History  Substance Use Topics  . Smoking status: Current Some Day Smoker  . Smokeless tobacco: Never Used  . Alcohol use No     Allergies   Patient has no known allergies.   Review of Systems Review of Systems  Reason unable to perform ROS: Urgent need for intervention.     Physical Exam Updated Vital Signs BP (!) 168/96 (BP Location: Left Arm)   Pulse (!) 119   Temp 97.5 F (36.4 C) (Oral)   Resp (!) 29   Ht '5\' 8"'$  (1.727 m)   Wt 55.4 kg (122 lb 1 oz)   SpO2 100%   BMI 18.56 kg/m   Physical Exam  Constitutional: She is oriented to person, place, and time.  Alert and oriented 3; no obvious facial asymmetry  HENT:  Head: Normocephalic and atraumatic.  Eyes: Conjunctivae are normal.  Neck: Neck supple.  Cardiovascular: Normal rate and regular rhythm.   Pulmonary/Chest: Effort normal and breath sounds normal.  Abdominal: Soft. Bowel sounds are normal.  Musculoskeletal: Normal range of motion.  Neurological: She is alert and oriented to person, place, and time.  Patient is moving her  arms and legs slowly to command.  Skin: Skin is warm and dry.  Psychiatric: She has a normal mood and affect. Her behavior is normal.  Nursing note and vitals reviewed.    ED Treatments / Results  Labs (all labs ordered are listed, but only abnormal results are displayed) Labs Reviewed  CBC - Abnormal; Notable for the following:       Result Value   WBC 22.1 (*)    RBC 5.31 (*)    Platelets 437 (*)    All other components within normal limits  COMPREHENSIVE METABOLIC PANEL - Abnormal; Notable for the following:    Sodium 133 (*)    CO2 <7 (*)    Glucose, Bld 348 (*)    Creatinine, Ser 1.09 (*)    Total Protein 9.0 (*)     ALT 12 (*)    Alkaline Phosphatase 303 (*)    Total Bilirubin 1.4 (*)    GFR calc non Af Amer 58 (*)    All other components within normal limits  CBG MONITORING, ED - Abnormal; Notable for the following:    Glucose-Capillary 320 (*)    All other components within normal limits  CBG MONITORING, ED - Abnormal; Notable for the following:    Glucose-Capillary 356 (*)    All other components within normal limits  I-STAT CG4 LACTIC ACID, ED - Abnormal; Notable for the following:    Lactic Acid, Venous 2.78 (*)    All other components within normal limits  URINALYSIS, ROUTINE W REFLEX MICROSCOPIC  I-STAT TROPOININ, ED    EKG  EKG Interpretation  Date/Time:  Saturday March 08 2017 13:07:36 EDT Ventricular Rate:  135 PR Interval:    QRS Duration: 79 QT Interval:  351 QTC Calculation: 498 R Axis:   -173 Text Interpretation:  Sinus tachycardia Paired ventricular premature complexes Probable lateral infarct, age indeterminate Confirmed by Nat Christen [phone]) on 03/08/2017 1:22:31 PM       Radiology Ct Head Wo Contrast  Result Date: 03/08/2017 CLINICAL DATA:  51 year old female with a history weakness and slurred speech EXAM: CT HEAD WITHOUT CONTRAST TECHNIQUE: Contiguous axial images were obtained from the base of the skull through the vertex without intravenous contrast. COMPARISON:  None. FINDINGS: Brain: No acute intracranial hemorrhage. No midline shift or mass effect. Gray-white differentiation maintained. Unremarkable appearance of the ventricular system. Vascular: Unremarkable. Skull: No acute fracture.  No aggressive bone lesion identified. Sinuses/Orbits: Unremarkable appearance of the orbits. Mastoid air cells clear. No middle ear effusion. No significant sinus disease. Other: None IMPRESSION: No CT evidence of acute intracranial abnormality. Aspects is 10 These results were called by telephone at the time of interpretation on 03/08/2017 at 1:28 pm to Dr. Nat Christen , who verbally  acknowledged these results. Electronically Signed   By: Corrie Mckusick D.O.   On: 03/08/2017 13:29    Procedures Procedures (including critical care time)  Medications Ordered in ED Medications - No data to display   Initial Impression / Assessment and Plan / ED Course  I have reviewed the triage vital signs and the nursing notes.  Pertinent labs & imaging results that were available during my care of the patient were reviewed by me and considered in my medical decision making (see chart for details).     CRITICAL CARE Performed by: Nat Christen Total critical care time: 30 minutes Critical care time was exclusive of separately billable procedures and treating other patients. Critical care was necessary to treat or prevent imminent or  life-threatening deterioration. Critical care was time spent personally by me on the following activities: development of treatment plan with patient and/or surrogate as well as nursing, discussions with consultants, evaluation of patient's response to treatment, examination of patient, obtaining history from patient or surrogate, ordering and performing treatments and interventions, ordering and review of laboratory studies, ordering and review of radiographic studies, pulse oximetry and re-evaluation of patient's condition.   Patient presents with dysarthria and generalized weakness. Initial glucose 320. Code stroke initiated. Discussed with Dr. Leonel Ramsay. Will obtain urgent CT head and transfer to [facility].   1330:  On further evaluation, elevated white count and lactic acid noted. Urinalysis and chest x-ray pending. CT head negative. Patient was transferred urgently to [facility] secondary to the uncertainty of her diagnosis. Discussed briefly with Dr. Leonel Ramsay and Dr. Eulis Foster.  Concern for sepsis identified. Final Clinical Impressions(s) / ED Diagnoses   Final diagnoses:  Dysarthria  Hyperglycemia    New Prescriptions New Prescriptions   No  medications on file     Nat Christen, MD 03/08/17 [place]    Nat Christen, MD 03/08/17 1348

## 2017-03-08 NOTE — ED Notes (Signed)
Code Stroke called.

## 2017-03-08 NOTE — H&P (Signed)
History and Physical        Hospital Admission Note Date: 03/08/2017  Patient name: Stephanie Huber Medical record number: [id number] Date of birth: Mar 28, 1966 Age: 51 y.o. Gender: female  PCP: Nolene Ebbs, MD    Patient coming from: home   I have reviewed all records in the [facility].    Chief Complaint: confusion, slurred speech   HPI: Patient is a 51 year old female with chronic kidney disease stage III, hypertension, type 1 diabetes, insulin-dependent presented to ED with nausea, vomiting, generalized weakness and slurred speech since morning. Patient is still somewhat lethargic, somnolent and confused unable to provide any history herself. She is oriented to herself and that she is in [facility]. History was obtained from the patient's daughter and her husband at the bedside. Per the daughter, she was talking to the patient on the phone in the morning, when she noticed that her mother was slurring her speech. Per patient's husband, who is a poor historian himself stated that patient was perfectly fine until this morning and his daughter alerted to the patient's slurred speech and confusion. Patient had nausea and vomiting today. He noticed insulin needles on the table but does not know that if she took any insulin this morning. He also stated that he is unsure if she is taking her insulin as prescribed. No reports of any coughing, fever or abdominal pain or diarrhea. Initially, code stroke was called due to sudden change in mental status and slurred speech. Patient was evaluated by neurology, Dr. Leonel Ramsay who felt patient had mild encephalopathy secondary to metabolic abnormalities. Code stroke was canceled.  ED work-up/course:  Temp 97.5, pulse 128, BP 158/105, O2 sats 100% on room air PH 6.9, PCO2 19.7. CMET showed sodium 133, potassium 4.2, creatinine  1.9, bicarb less than 7, anion gap 26. WBC is 22.1, hemoglobin 14.0. Lactic acid 2.78. Glucose 303 UA showed ketones and glucose more than 500, Chest x-ray clear  Review of Systems: Positives marked in 'bold' Unable to obtain any review of systems due to her mental status  Past Medical History: Past Medical History:  Diagnosis Date  . CKD (chronic kidney disease), stage III   . Essential hypertension   . Type 1 diabetes mellitus ([place])     History reviewed. No pertinent surgical history.  Medications: Prior to Admission medications   Medication Sig Start Date End Date Taking? Authorizing Provider  blood glucose meter kit and supplies KIT Dispense based on patient and insurance preference. Use up to four times daily as directed. (FOR ICD-9 250.00, 250.01). 02/21/15   Troyce Gieske K, MD  insulin aspart (NOVOLOG) 100 UNIT/ML injection Inject 5 Units into the skin 3 (three) times daily with meals. 02/21/15   Corinthia Helmers K, MD  insulin glargine (LANTUS) 100 UNIT/ML injection Inject 0.25 mLs (25 Units total) into the skin daily. 02/21/15   Tavares Levinson, Vernelle Emerald, MD  Insulin Syringes, Disposable, U-100 1 ML MISC Use as directed 02/21/15   Lisamarie Coke K, MD  metroNIDAZOLE (FLAGYL) 500 MG tablet Take 500 mg by mouth 2 (two) times daily. Starting on 02/25/17 to last 7 days 02/25/17   [provider]  Allergies:  No Known Allergies  Social History:  reports that she has been smoking.  She has never used smokeless tobacco. She reports that she does not drink alcohol or use drugs.  Family History: Family History  Problem Relation Age of Onset  . Dementia Unknown     Physical Exam: Blood pressure 137/83, pulse (!) 109, temperature 97.6 F (36.4 C), temperature source Oral, resp. rate (!) 28, height _0  (1.727 m), weight 55.4 kg (122 lb 1 oz), SpO2 100 %. General: Alert, awake, oriented x2 in no acute distress, Dry mucosal membranes. Eyes: pink conjunctiva,anicteric sclera, pupils equal  and reactive to light and accomodation, HEENT: normocephalic, atraumatic, oropharynx clear Neck: supple, no masses or lymphadenopathy, no goiter, no bruits, no JVD CVS: Regular rate and rhythm, without murmurs, rubs or gallops. No lower extremity edema Resp : Clear to auscultation bilaterally, no wheezing, rales or rhonchi. GI : Soft, nontender, nondistended, positive bowel sounds, no masses. No hepatomegaly. No hernia.  Musculoskeletal: No clubbing or cyanosis, positive pedal pulses. No contracture. ROM intact  Neuro: Grossly intact, no focal neurological deficits, strength 5/5 upper and lower extremities bilaterally Psych: alert and oriented x 2, somewhat confused Skin: no rashes or lesions, warm and dry   LABS on Admission: I have personally reviewed all the labs and imagings below    Basic Metabolic Panel:  Recent Labs Lab 03/08/17 1309  NA 133*  K 4.2  CL 101  CO2 <7*  GLUCOSE 348*  BUN 8  CREATININE 1.09*  CALCIUM 8.9   Liver Function Tests:  Recent Labs Lab 03/08/17 1309  AST 16  ALT 12*  ALKPHOS 303*  BILITOT 1.4*  PROT 9.0*  ALBUMIN 3.7   No results for input(s): LIPASE, AMYLASE in the last 168 hours. No results for input(s): AMMONIA in the last 168 hours. CBC:  Recent Labs Lab 03/08/17 1309  WBC 22.1*  HGB 14.0  HCT 42.4  MCV 79.8  PLT 437*   Cardiac Enzymes: No results for input(s): CKTOTAL, CKMB, CKMBINDEX, TROPONINI in the last 168 hours. BNP: Invalid input(s): POCBNP CBG:  Recent Labs Lab 03/08/17 1507 03/08/17 1635  GLUCAP 300* 245*    Radiological Exams on Admission:  Ct Head Wo Contrast  Result Date: 03/08/2017 CLINICAL DATA:  51 year old female with a history weakness and slurred speech EXAM: CT HEAD WITHOUT CONTRAST TECHNIQUE: Contiguous axial images were obtained from the base of the skull through the vertex without intravenous contrast. COMPARISON:  None. FINDINGS: Brain: No acute intracranial hemorrhage. No midline shift or  mass effect. Gray-white differentiation maintained. Unremarkable appearance of the ventricular system. Vascular: Unremarkable. Skull: No acute fracture.  No aggressive bone lesion identified. Sinuses/Orbits: Unremarkable appearance of the orbits. Mastoid air cells clear. No middle ear effusion. No significant sinus disease. Other: None IMPRESSION: No CT evidence of acute intracranial abnormality. Aspects is 10 These results were called by telephone at the time of interpretation on 03/08/2017 at 1:28 pm to Dr. Nat Christen , who verbally acknowledged these results. Electronically Signed   By: Corrie Mckusick D.O.   On: 03/08/2017 13:29   Dg Chest Portable 1 View  Result Date: 03/08/2017 CLINICAL DATA:  51 year old female with a history of dyspnea EXAM: PORTABLE CHEST 1 VIEW COMPARISON:  11/09/2015, 09/21/2006 FINDINGS: Cardiomediastinal silhouette unchanged in size and contour. No pneumothorax or pleural effusion.  No confluent airspace disease. No evidence of central vascular congestion or edema. No displaced fracture IMPRESSION: No radiographic evidence of acute cardiopulmonary disease Electronically Signed  By: Corrie Mckusick D.O.   On: 03/08/2017 14:26      EKG: Independently reviewed. Rate 135, sinus tachycardia   Assessment/Plan Principal Problem:   DKA (diabetic ketoacidoses) ([place]) with uncontrolled diabetes mellitus, insulin-dependent,  - Unclear what precipitated DKA, possibly noncompliant with insulin as per family.  Bicarbonate of less than 7, gap 26 - Placed on DKA protocol, aggressive IV fluid hydration, IV insulin drip -  obtain hemoglobin A1c, diabetic coordinator consult, nutrition consult - Obtain serial BMET  Active Problems:   Metabolic acidosis - Likely due to profound DKA and lactic acidosis - Continue aggressive IV fluid hydration and IV insulin drip    Acute encephalopathy -Likely due to #1, dehydration - UA negative for UTI, CT head negative for any acute intracranial  abnormalities - Continue to monitor closely, no focal neurological deficits, patient has been evaluated by neurology, no further workup recommended    CKD (chronic kidney disease), stage III - Currently creatinine at baseline, continue aggressive IV fluid hydration    Essential hypertension - BP improving, not on any antihypertensives outpatient - Follow closely    Leukocytosis - Possibly due to stress to margination, no infectious source identified yet, no systemic signs of infection - Obtain blood cultures, pro-calcitonin level - UA negative, chest x-ray clear  DVT prophylaxis: Lovenox  CODE STATUS: Full CODE STATUS, discussed with patient's husband  Consults called: None  Family Communication: Admission, patients condition and plan of care including tests being ordered have been discussed with the patient's husband who indicates understanding and agree with the plan and Code Status  Admission status:   Disposition plan: Further plan will depend as patient's clinical course evolves and further radiologic and laboratory data become available.    At the time of admission, it appears that the appropriate admission status for this patient is observation. . This is judged to be reasonable and necessary in order to provide the required intensity of service to ensure the patient's safety given the presenting symptoms of profound DKA, dehydration, acute encephalopathy, physical exam findings, and initial radiographic and laboratory data in the context of their chronic comorbidities.  The medical decision making on this patient was of high complexity and the patient is at high risk for clinical deterioration, therefore this is a level 3 visit.   Time Spent on Admission: 70 mins    Roselynne Lortz M.D. [facility] 03/08/2017, 5:10 PM Pager: [phone]  If 7PM-7AM, please contact night-coverage [email or web address] Password [email or web address]

## 2017-03-08 NOTE — ED Provider Notes (Signed)
[place] DEPT Provider Note   CSN: [id number] Arrival date & time: 03/08/17  1247     History   Chief Complaint Chief Complaint  Patient presents with  . Weakness  . Aphasia    HPI Stephanie Huber is a 51 y.o. female.  HPI 51 year old female who presents with generalized weakness, nausea vomiting, and slurring of her words over the past day. History of hypertension, type 1 diabetes, and a stage III chronic kidney disease. Denies any fevers, chills, diarrhea, abdominal pain, cough or difficulty breathing. No urinary complaints. No focal numbness or weakness. Unsure if she took insulin today.  Past Medical History:  Diagnosis Date  . CKD (chronic kidney disease), stage III   . Essential hypertension   . Type 1 diabetes mellitus [facility])     Patient Active Problem List   Diagnosis Date Noted  . Acute kidney injury ([place]) 11/10/2015  . Hypothermia 11/09/2015  . Acute encephalopathy 11/09/2015  . SIRS (systemic inflammatory response syndrome) ([place]) 11/09/2015  . CKD (chronic kidney disease), stage III   . Type 1 diabetes mellitus with stage 3 chronic kidney disease ([place])   . Essential hypertension   . DKA (diabetic ketoacidoses) ([place]) 02/18/2015  . Metabolic acidosis 40/98/1191    History reviewed. No pertinent surgical history.  OB History    No data available       Home Medications    Prior to Admission medications   Medication Sig Start Date End Date Taking? Authorizing Provider  blood glucose meter kit and supplies KIT Dispense based on patient and insurance preference. Use up to four times daily as directed. (FOR ICD-9 250.00, 250.01). 02/21/15   Rai, Ripudeep K, MD  insulin aspart (NOVOLOG) 100 UNIT/ML injection Inject 5 Units into the skin 3 (three) times daily with meals. 02/21/15   Rai, Ripudeep K, MD  insulin glargine (LANTUS) 100 UNIT/ML injection Inject 0.25 mLs (25 Units total) into the skin daily. 02/21/15   Rai, Vernelle Emerald, MD  Insulin Syringes,  Disposable, U-100 1 ML MISC Use as directed 02/21/15   Rai, Ripudeep K, MD  metroNIDAZOLE (FLAGYL) 500 MG tablet Take 500 mg by mouth 2 (two) times daily. Starting on 02/25/17 to last 7 days 02/25/17   [provider]    Family History Family History  Problem Relation Age of Onset  . Dementia Unknown     Social History Social History  Substance Use Topics  . Smoking status: Current Some Day Smoker  . Smokeless tobacco: Never Used  . Alcohol use No     Allergies   Patient has no known allergies.   Review of Systems Review of Systems  Constitutional: Negative for fever.  Respiratory: Negative for cough.   Cardiovascular: Negative for chest pain.  Gastrointestinal: Positive for nausea and vomiting. Negative for abdominal pain.  Genitourinary: Negative for difficulty urinating.  Neurological: Positive for speech difficulty.  All other systems reviewed and are negative.    Physical Exam Updated Vital Signs BP 137/83   Pulse (!) 109   Temp 97.6 F (36.4 C) (Oral)   Resp (!) 28   Ht _0  (1.727 m)   Wt 55.4 kg (122 lb 1 oz)   SpO2 100%   BMI 18.56 kg/m   Physical Exam Physical Exam  Nursing note and vitals reviewed. Constitutional: Well developed, well nourished, non-toxic, and in no acute distress Head: Normocephalic and atraumatic.  Mouth/Throat: Oropharynx is clear and moist.  Neck: Normal range of motion. Neck supple.  Cardiovascular:  Normal rate and regular rhythm.   Pulmonary/Chest: Effort normal and breath sounds normal.  Abdominal: Soft. There is no tenderness. There is no rebound and no guarding.  Musculoskeletal: Normal range of motion.  Neurological: Alert but low energy, no facial droop, fluent speech, disoriented to time, moves all extremities symmetrically, sensation to light touch in tact throughout, no dysmetria with finger to nose,  Skin: Skin is warm and dry.  Psychiatric: Cooperative   ED Treatments / Results  Labs (all labs ordered  are listed, but only abnormal results are displayed) Labs Reviewed  CBC - Abnormal; Notable for the following:       Result Value   WBC 22.1 (*)    RBC 5.31 (*)    Platelets 437 (*)    All other components within normal limits  COMPREHENSIVE METABOLIC PANEL - Abnormal; Notable for the following:    Sodium 133 (*)    CO2 <7 (*)    Glucose, Bld 348 (*)    Creatinine, Ser 1.09 (*)    Total Protein 9.0 (*)    ALT 12 (*)    Alkaline Phosphatase 303 (*)    Total Bilirubin 1.4 (*)    GFR calc non Af Amer 58 (*)    All other components within normal limits  URINALYSIS, ROUTINE W REFLEX MICROSCOPIC - Abnormal; Notable for the following:    Color, Urine STRAW (*)    APPearance HAZY (*)    Glucose, UA >=500 (*)    Hgb urine dipstick SMALL (*)    Ketones, ur 80 (*)    Protein, ur 100 (*)    Bacteria, UA RARE (*)    Squamous Epithelial / LPF 0-5 (*)    All other components within normal limits  CBG MONITORING, ED - Abnormal; Notable for the following:    Glucose-Capillary 320 (*)    All other components within normal limits  CBG MONITORING, ED - Abnormal; Notable for the following:    Glucose-Capillary 356 (*)    All other components within normal limits  I-STAT CG4 LACTIC ACID, ED - Abnormal; Notable for the following:    Lactic Acid, Venous 2.78 (*)    All other components within normal limits  I-STAT VENOUS BLOOD GAS, ED - Abnormal; Notable for the following:    pH, Ven 6.923 (*)    pCO2, Ven 19.7 (*)    Bicarbonate 4.1 (*)    Acid-base deficit 27.0 (*)    All other components within normal limits  CBG MONITORING, ED - Abnormal; Notable for the following:    Glucose-Capillary 300 (*)    All other components within normal limits  URINE CULTURE  URINALYSIS, ROUTINE W REFLEX MICROSCOPIC  I-STAT TROPOININ, ED  I-STAT CG4 LACTIC ACID, ED    EKG  EKG Interpretation  Date/Time:  Saturday March 08 2017 13:07:36 EDT Ventricular Rate:  135 PR Interval:    QRS Duration: 79 QT  Interval:  351 QTC Calculation: 498 R Axis:   -173 Text Interpretation:  Sinus tachycardia Paired ventricular premature complexes Probable lateral infarct, age indeterminate Confirmed by Nat Christen [phone]) on 03/08/2017 1:22:31 PM       Radiology Ct Head Wo Contrast  Result Date: 03/08/2017 CLINICAL DATA:  51 year old female with a history weakness and slurred speech EXAM: CT HEAD WITHOUT CONTRAST TECHNIQUE: Contiguous axial images were obtained from the base of the skull through the vertex without intravenous contrast. COMPARISON:  None. FINDINGS: Brain: No acute intracranial hemorrhage. No midline shift or mass effect.  Gray-white differentiation maintained. Unremarkable appearance of the ventricular system. Vascular: Unremarkable. Skull: No acute fracture.  No aggressive bone lesion identified. Sinuses/Orbits: Unremarkable appearance of the orbits. Mastoid air cells clear. No middle ear effusion. No significant sinus disease. Other: None IMPRESSION: No CT evidence of acute intracranial abnormality. Aspects is 10 These results were called by telephone at the time of interpretation on 03/08/2017 at 1:28 pm to Dr. Nat Christen , who verbally acknowledged these results. Electronically Signed   By: Corrie Mckusick D.O.   On: 03/08/2017 13:29   Dg Chest Portable 1 View  Result Date: 03/08/2017 CLINICAL DATA:  51 year old female with a history of dyspnea EXAM: PORTABLE CHEST 1 VIEW COMPARISON:  11/09/2015, 09/21/2006 FINDINGS: Cardiomediastinal silhouette unchanged in size and contour. No pneumothorax or pleural effusion.  No confluent airspace disease. No evidence of central vascular congestion or edema. No displaced fracture IMPRESSION: No radiographic evidence of acute cardiopulmonary disease Electronically Signed   By: Corrie Mckusick D.O.   On: 03/08/2017 14:26    Procedures Procedures (including critical care time) CRITICAL CARE Performed by: Forde Dandy   Total critical care time:40   minutes  Critical care time was exclusive of separately billable procedures and treating other patients.  Critical care was necessary to treat or prevent imminent or life-threatening deterioration.  Critical care was time spent personally by me on the following activities: development of treatment plan with patient and/or surrogate as well as nursing, discussions with consultants, evaluation of patient's response to treatment, examination of patient, obtaining history from patient or surrogate, ordering and performing treatments and interventions, ordering and review of laboratory studies, ordering and review of radiographic studies, pulse oximetry and re-evaluation of patient's condition.  Medications Ordered in ED Medications  0.9 %  sodium chloride infusion (not administered)  0.9 %  sodium chloride infusion (not administered)  dextrose 5 %-0.45 % sodium chloride infusion (not administered)  insulin regular (NOVOLIN R,HUMULIN R) 100 Units in sodium chloride 0.9 % 100 mL (1 Units/mL) infusion (not administered)  potassium chloride 10 mEq in 100 mL IVPB (not administered)  sodium chloride 0.9 % bolus 1,000 mL (1,000 mLs Intravenous New Bag/Given 03/08/17 1418)  sodium chloride 0.9 % bolus 1,000 mL (1,000 mLs Intravenous New Bag/Given 03/08/17 1418)     Initial Impression / Assessment and Plan / ED Course  I have reviewed the triage vital signs and the nursing notes.  Pertinent labs & imaging results that were available during my care of the patient were reviewed by me and considered in my medical decision making (see chart for details).     The patient is transferred from [facility] for code stroke. No focal neurological deficits on exam, and code stroke was canceled by Dr. Leonel Ramsay. CT had visualized and shows no acute intracranial processes. Blood work reviewed, and she has severe metabolic acidosis with bicarbonate less than 7. She has a leukocytosis of 20. Hyperglycemia  380, pH 6.9, bicarb < 7, and a lactic acid of 2.28.   Suspect that she has DKA as lactic acid is unlikely contributing to such severe acidosis. She is given IV fluids, and started on an insulin drip. Discussed with hospitalist service who will admit her for ongoing management.  Final Clinical Impressions(s) / ED Diagnoses   Final diagnoses:  Dysarthria  Hyperglycemia  Diabetic ketoacidosis without coma associated with type 1 diabetes mellitus ([place])    New Prescriptions New Prescriptions   No medications on file     Brantley Stage  Duo, MD 03/08/17 1735

## 2017-03-08 NOTE — ED Notes (Signed)
md talking with husband, pt to transfer to [place]

## 2017-03-08 NOTE — ED Notes (Signed)
Patient transported to CT 

## 2017-03-08 NOTE — ED Notes (Signed)
Pt [facility] ED @ 13:45 [facility] pt called out as a Code Stroke canceled at 13:49 by Amada JupiterKirkpatrick, MD neurologist, EDP cleared airway at 13:48, pt A&O x4 upon arrival to ED

## 2017-03-08 NOTE — ED Notes (Signed)
ED Provider at bedside. 

## 2017-03-08 NOTE — ED Notes (Signed)
[facility] here for transport 

## 2017-03-08 NOTE — Code Documentation (Addendum)
51 y.o. Female with PMHx of DM1, essential HTN and CKD3 [facility]/o generalized weakness, nausea vomiting, and slurring of her words over the past day. LKW unclear. [facility] and showed no acute intracranial abnormalities. ASPECTS 10. The [facility]. [facility], [facility] was cleared and patient examined by the neurologist and SRN. Patient reports generalized weakness, multiple episodes of emesis and slurring of her speech. No focal or lateralizing deficits were appreciated. NIHSS 1, scored for her not being able to recite the correct month. See EMR for NIHSS and code stroke times. tPA not given due to being too mild to treat. Code stroke canceled per neurologist. ED bedside handoff with ED RN Toniann FailWendy.

## 2017-03-08 NOTE — Consult Note (Signed)
Neurology Consultation Reason for Consult: Altered mental status Referring Physician: Adriana Simasook, B  CC: Altered mental status  History is obtained from: Patient  HPI: Stephanie Huber is a 51 y.o. female who has been feeling kind of crummy all day. Over the course of the morning, her husband felt like she was starting to slur her words. She had multiple episodes of emesis. She also was responding slower than typical and therefore was [facility]. They're, there is concern for a sudden change in mental status and therefore she was transferred as a code stroke to [facility] for [facility].  She is complaining of some abdominal pain.  LKW: Unclear tpa given?: no, mild symptoms    ROS: A 14 point ROS was performed and is negative except as noted in the HPI.   Past Medical History:  Diagnosis Date  . CKD (chronic kidney disease), stage III   . Essential hypertension   . Type 1 diabetes mellitus (HCC)      Family History  Problem Relation Age of Onset  . Dementia Unknown      Social History:  reports that she has been smoking.  She has never used smokeless tobacco. She reports that she does not drink alcohol or use drugs.   Exam: Current vital signs: BP (!) 157/87 (BP Location: Left Arm)   Pulse (!) 117   Temp 97.6 F (36.4 C) (Oral)   Resp (!) 24   Ht 5\' 8"  (1.727 m)   Wt 55.4 kg (122 lb 1 oz)   SpO2 100%   BMI 18.56 kg/m  Vital signs in last 24 hours: Temp:  [97.5 F (36.4 C)-97.6 F (36.4 C)] 97.6 F (36.4 C) (06/23 1357) Pulse Rate:  [117-128] 117 (06/23 1357) Resp:  [24-31] 24 (06/23 1357) BP: (157-181)/(87-105) 157/87 (06/23 1357) SpO2:  [100 %] 100 % (06/23 1357) Weight:  [55.4 kg (122 lb 1 oz)] 55.4 kg (122 lb 1 oz) (06/23 1323)   Physical Exam  Constitutional: Appears well-developed and well-nourished.  Psych: Affect appropriate to situation Eyes: No scleral injection HENT: No OP obstrucion Head: Normocephalic.   Cardiovascular: Normal rate and regular rhythm.  Respiratory: Effort normal and breath sounds normal to anterior ascultation GI: Soft.  No distension. There is no tenderness.  Skin: WDI  Neuro: Mental Status: Patient is awake, alert, oriented to person, place, year, and situation.She is unable to give the month She appears mildly confused. No signs of aphasia or neglect Cranial Nerves: II: Visual Fields are full. Pupils are equal, round, and reactive to light.   III,IV, VI: EOMI without ptosis or diploplia.  V: Facial sensation is symmetric to temperature VII: Facial movement is symmetric.  VIII: hearing is intact to voice X: Uvula elevates symmetrically XI: Shoulder shrug is symmetric. XII: tongue is midline without atrophy or fasciculations.  Motor: Tone is normal. Bulk is normal. 5/5 strength was present in all four extremities.  Sensory: Sensation is symmetric to light touch and temperature in the arms and legs. Cerebellar: FNF intact bilaterally      I have reviewed labs in epic and the results pertinent to this consultation are: Bicarbonate less than 7 Leukocytosis Elevated glucose Lactate 2.7  I have reviewed the images obtained: CT head-negative  Impression: 51 year old female with mild encephalopathy in keeping with her metabolic abnormalities. At this time I recommend no further evaluation from a strictly neurological perspective, defer management of medical issues to ER/internal medicine  Recommendations: 1) no  further recommendations at this time, please call further questions or concerns.   Ritta Slot, MD [facility] [phone]  If 7pm- 7am, please page neurology on call as listed in AMION.

## 2017-03-08 NOTE — ED Notes (Signed)
Family at bedside. 

## 2017-03-09 ENCOUNTER — Observation Stay (HOSPITAL_COMMUNITY): Payer: Medicare Other

## 2017-03-09 ENCOUNTER — Encounter (HOSPITAL_COMMUNITY): Payer: Self-pay | Admitting: Radiology

## 2017-03-09 DIAGNOSIS — L0291 Cutaneous abscess, unspecified: Secondary | ICD-10-CM

## 2017-03-09 DIAGNOSIS — R739 Hyperglycemia, unspecified: Secondary | ICD-10-CM | POA: Diagnosis not present

## 2017-03-09 DIAGNOSIS — R4701 Aphasia: Secondary | ICD-10-CM | POA: Diagnosis present

## 2017-03-09 DIAGNOSIS — B9689 Other specified bacterial agents as the cause of diseases classified elsewhere: Secondary | ICD-10-CM | POA: Diagnosis present

## 2017-03-09 DIAGNOSIS — Z681 Body mass index (BMI) 19 or less, adult: Secondary | ICD-10-CM | POA: Diagnosis not present

## 2017-03-09 DIAGNOSIS — E13628 Other specified diabetes mellitus with other skin complications: Secondary | ICD-10-CM | POA: Diagnosis present

## 2017-03-09 DIAGNOSIS — R471 Dysarthria and anarthria: Secondary | ICD-10-CM | POA: Diagnosis present

## 2017-03-09 DIAGNOSIS — I129 Hypertensive chronic kidney disease with stage 1 through stage 4 chronic kidney disease, or unspecified chronic kidney disease: Secondary | ICD-10-CM | POA: Diagnosis present

## 2017-03-09 DIAGNOSIS — N183 Chronic kidney disease, stage 3 (moderate): Secondary | ICD-10-CM | POA: Diagnosis present

## 2017-03-09 DIAGNOSIS — T383X6A Underdosing of insulin and oral hypoglycemic [antidiabetic] drugs, initial encounter: Secondary | ICD-10-CM | POA: Diagnosis present

## 2017-03-09 DIAGNOSIS — Z818 Family history of other mental and behavioral disorders: Secondary | ICD-10-CM | POA: Diagnosis not present

## 2017-03-09 DIAGNOSIS — L03116 Cellulitis of left lower limb: Secondary | ICD-10-CM | POA: Diagnosis present

## 2017-03-09 DIAGNOSIS — E440 Moderate protein-calorie malnutrition: Secondary | ICD-10-CM | POA: Diagnosis present

## 2017-03-09 DIAGNOSIS — E1010 Type 1 diabetes mellitus with ketoacidosis without coma: Secondary | ICD-10-CM | POA: Diagnosis present

## 2017-03-09 DIAGNOSIS — E860 Dehydration: Secondary | ICD-10-CM | POA: Diagnosis present

## 2017-03-09 DIAGNOSIS — E876 Hypokalemia: Secondary | ICD-10-CM | POA: Diagnosis present

## 2017-03-09 DIAGNOSIS — R531 Weakness: Secondary | ICD-10-CM | POA: Diagnosis present

## 2017-03-09 DIAGNOSIS — Z792 Long term (current) use of antibiotics: Secondary | ICD-10-CM | POA: Diagnosis not present

## 2017-03-09 DIAGNOSIS — I10 Essential (primary) hypertension: Secondary | ICD-10-CM | POA: Diagnosis not present

## 2017-03-09 DIAGNOSIS — F17200 Nicotine dependence, unspecified, uncomplicated: Secondary | ICD-10-CM | POA: Diagnosis present

## 2017-03-09 DIAGNOSIS — Y92009 Unspecified place in unspecified non-institutional (private) residence as the place of occurrence of the external cause: Secondary | ICD-10-CM | POA: Diagnosis not present

## 2017-03-09 DIAGNOSIS — Z794 Long term (current) use of insulin: Secondary | ICD-10-CM | POA: Diagnosis not present

## 2017-03-09 DIAGNOSIS — E10649 Type 1 diabetes mellitus with hypoglycemia without coma: Secondary | ICD-10-CM | POA: Diagnosis not present

## 2017-03-09 DIAGNOSIS — L02416 Cutaneous abscess of left lower limb: Secondary | ICD-10-CM | POA: Diagnosis present

## 2017-03-09 DIAGNOSIS — E1022 Type 1 diabetes mellitus with diabetic chronic kidney disease: Secondary | ICD-10-CM | POA: Diagnosis present

## 2017-03-09 DIAGNOSIS — G9340 Encephalopathy, unspecified: Secondary | ICD-10-CM | POA: Diagnosis not present

## 2017-03-09 DIAGNOSIS — G9341 Metabolic encephalopathy: Secondary | ICD-10-CM | POA: Diagnosis present

## 2017-03-09 LAB — GLUCOSE, CAPILLARY
GLUCOSE-CAPILLARY: 127 mg/dL — AB (ref 65–99)
GLUCOSE-CAPILLARY: 127 mg/dL — AB (ref 65–99)
GLUCOSE-CAPILLARY: 129 mg/dL — AB (ref 65–99)
GLUCOSE-CAPILLARY: 146 mg/dL — AB (ref 65–99)
GLUCOSE-CAPILLARY: 153 mg/dL — AB (ref 65–99)
GLUCOSE-CAPILLARY: 200 mg/dL — AB (ref 65–99)
GLUCOSE-CAPILLARY: 203 mg/dL — AB (ref 65–99)
GLUCOSE-CAPILLARY: 174 mg/dL — AB (ref 65–99)
Glucose-Capillary: 100 mg/dL — ABNORMAL HIGH (ref 65–99)
Glucose-Capillary: 131 mg/dL — ABNORMAL HIGH (ref 65–99)
Glucose-Capillary: 136 mg/dL — ABNORMAL HIGH (ref 65–99)
Glucose-Capillary: 146 mg/dL — ABNORMAL HIGH (ref 65–99)
Glucose-Capillary: 165 mg/dL — ABNORMAL HIGH (ref 65–99)
Glucose-Capillary: 188 mg/dL — ABNORMAL HIGH (ref 65–99)

## 2017-03-09 LAB — BASIC METABOLIC PANEL
Anion gap: 7 (ref 5–15)
Anion gap: 8 (ref 5–15)
Anion gap: 10 (ref 5–15)
BUN: 5 mg/dL — AB (ref 6–20)
BUN: 5 mg/dL — AB (ref 6–20)
CALCIUM: 8.6 mg/dL — AB (ref 8.9–10.3)
CO2: 15 mmol/L — ABNORMAL LOW (ref 22–32)
CO2: 15 mmol/L — ABNORMAL LOW (ref 22–32)
CO2: 13 mmol/L — ABNORMAL LOW (ref 22–32)
CREATININE: 0.61 mg/dL (ref 0.44–1.00)
Calcium: 8.7 mg/dL — ABNORMAL LOW (ref 8.9–10.3)
Calcium: 8.8 mg/dL — ABNORMAL LOW (ref 8.9–10.3)
Chloride: 113 mmol/L — ABNORMAL HIGH (ref 101–111)
Chloride: 111 mmol/L (ref 101–111)
Chloride: 110 mmol/L (ref 101–111)
Creatinine, Ser: 0.62 mg/dL (ref 0.44–1.00)
Creatinine, Ser: 0.62 mg/dL (ref 0.44–1.00)
GFR calc Af Amer: >60 mL/min (ref >60)
GFR calc Af Amer: >60 mL/min (ref >60)
GFR calc Af Amer: >60 mL/min (ref >60)
GFR calc non Af Amer: >60 mL/min (ref >60)
GLUCOSE: 136 mg/dL — AB (ref 65–99)
GLUCOSE: 144 mg/dL — AB (ref 65–99)
GLUCOSE: 198 mg/dL — AB (ref 65–99)
POTASSIUM: 3.2 mmol/L — AB (ref 3.5–5.1)
POTASSIUM: 3.4 mmol/L — AB (ref 3.5–5.1)
Potassium: 3.3 mmol/L — ABNORMAL LOW (ref 3.5–5.1)
Sodium: 135 mmol/L (ref 135–145)
Sodium: 134 mmol/L — ABNORMAL LOW (ref 135–145)
Sodium: 133 mmol/L — ABNORMAL LOW (ref 135–145)

## 2017-03-09 LAB — HIV ANTIBODY (ROUTINE TESTING W REFLEX): HIV SCREEN 4TH GENERATION: NONREACTIVE

## 2017-03-09 LAB — CBC
HCT: 38.3 % (ref 36.0–46.0)
HEMOGLOBIN: 12.8 g/dL (ref 12.0–15.0)
MCH: 25.4 pg — AB (ref 26.0–34.0)
MCHC: 33.4 g/dL (ref 30.0–36.0)
MCV: 76 fL — ABNORMAL LOW (ref 78.0–100.0)
PLATELETS: 288 10*3/uL (ref 150–400)
RBC: 5.04 MIL/uL (ref 3.87–5.11)
RDW: 14.2 % (ref 11.5–15.5)
WBC: 11.1 10*3/uL — ABNORMAL HIGH (ref 4.0–10.5)

## 2017-03-09 LAB — URINE CULTURE

## 2017-03-09 MED ORDER — PIPERACILLIN-TAZOBACTAM 3.375 G IVPB
3.3750 g | Freq: Three times a day (TID) | INTRAVENOUS | Status: DC
  Administered 2017-03-09 – 2017-03-10 (×3): 3.375 g via INTRAVENOUS
  Filled 2017-03-09 (×4): qty 50

## 2017-03-09 MED ORDER — SODIUM CHLORIDE 0.9 % IV SOLN
1250.0000 mg | Freq: Once | INTRAVENOUS | Status: AC
  Administered 2017-03-09: 1250 mg via INTRAVENOUS
  Filled 2017-03-09: qty 1250

## 2017-03-09 MED ORDER — HYDROMORPHONE HCL 1 MG/ML IJ SOLN
1.0000 mg | INTRAMUSCULAR | Status: DC | PRN
  Administered 2017-03-10 – 2017-03-13 (×5): 1 mg via INTRAVENOUS
  Filled 2017-03-09 (×5): qty 1

## 2017-03-09 MED ORDER — IOPAMIDOL (ISOVUE-300) INJECTION 61%
INTRAVENOUS | Status: AC
  Administered 2017-03-09: 100 mL
  Filled 2017-03-09: qty 100

## 2017-03-09 MED ORDER — INSULIN ASPART 100 UNIT/ML ~~LOC~~ SOLN
0.0000 [IU] | Freq: Three times a day (TID) | SUBCUTANEOUS | Status: DC
  Administered 2017-03-09 – 2017-03-10 (×3): 2 [IU] via SUBCUTANEOUS
  Administered 2017-03-10: 1 [IU] via SUBCUTANEOUS
  Administered 2017-03-10: 2 [IU] via SUBCUTANEOUS
  Administered 2017-03-11: 1 [IU] via SUBCUTANEOUS
  Administered 2017-03-11: 3 [IU] via SUBCUTANEOUS
  Administered 2017-03-12: 1 [IU] via SUBCUTANEOUS
  Administered 2017-03-13: 3 [IU] via SUBCUTANEOUS
  Administered 2017-03-13 (×2): 2 [IU] via SUBCUTANEOUS

## 2017-03-09 MED ORDER — POTASSIUM CHLORIDE CRYS ER 20 MEQ PO TBCR
40.0000 meq | EXTENDED_RELEASE_TABLET | Freq: Once | ORAL | Status: AC
  Administered 2017-03-09: 40 meq via ORAL
  Filled 2017-03-09: qty 2

## 2017-03-09 MED ORDER — VANCOMYCIN HCL IN DEXTROSE 750-5 MG/150ML-% IV SOLN
750.0000 mg | Freq: Two times a day (BID) | INTRAVENOUS | Status: DC
  Administered 2017-03-09 – 2017-03-10 (×2): 750 mg via INTRAVENOUS
  Filled 2017-03-09 (×2): qty 150

## 2017-03-09 MED ORDER — INSULIN ASPART 100 UNIT/ML ~~LOC~~ SOLN: 0.0000 [IU] | Freq: Every day | SUBCUTANEOUS | Status: DC

## 2017-03-09 MED ORDER — INSULIN GLARGINE 100 UNIT/ML ~~LOC~~ SOLN
20.0000 [IU] | Freq: Every day | SUBCUTANEOUS | Status: DC
  Administered 2017-03-09: 20 [IU] via SUBCUTANEOUS
  Filled 2017-03-09 (×2): qty 0.2

## 2017-03-09 MED ORDER — INSULIN ASPART 100 UNIT/ML ~~LOC~~ SOLN: 3.0000 [IU] | Freq: Three times a day (TID) | SUBCUTANEOUS | Status: DC

## 2017-03-09 MED ORDER — TRAMADOL HCL 50 MG PO TABS
50.0000 mg | ORAL_TABLET | Freq: Four times a day (QID) | ORAL | Status: DC | PRN
  Administered 2017-03-09 – 2017-03-13 (×4): 50 mg via ORAL
  Filled 2017-03-09 (×4): qty 1

## 2017-03-09 MED ORDER — DEXTROSE-NACL 5-0.45 % IV SOLN: INTRAVENOUS | Status: DC

## 2017-03-09 MED ORDER — POTASSIUM CHLORIDE 10 MEQ/100ML IV SOLN
10.0000 meq | Freq: Once | INTRAVENOUS | Status: DC
  Filled 2017-03-09: qty 100

## 2017-03-09 NOTE — Progress Notes (Signed)
[facility]                                                                              Patient Demographics  Stephanie Huber, is a 51 y.o. female, DOB - 1965-10-25, VWU:[id number]  Admit date - 03/08/2017   Admitting Physician Ripudeep [name], MD  Outpatient Primary MD for the patient is Fleet Contras, MD  Outpatient specialists:   LOS - 0  days   Medical records reviewed and are as summarized below:    Chief Complaint  Patient presents with  . Weakness  . Aphasia       Brief summary   Patient is a 51 year old female with CKD stage III, hypertension, diabetes mellitus, insulin-dependent presented with nausea, vomiting, generalized weakness, slurred speech, altered mental status on the day of admission. She was found in profound DKA, 26, bicarbonate less than 7, creatinine 1.9.  Patient was admitted to stepdown unit on insulin drip and DKA protocol.   Assessment & Plan    Principal Problem:   DKA (diabetic ketoacidoses) (HCC) with uncontrolled diabetes mellitus type 1, insulin-dependent,  - Bicarbonate of less than 7, gap 26 at the time of admission. Patient was confused and unable to provide much history. Now alert awake and oriented, states that she has "boil" on the left thigh for the last 2-3 days, tender which may be the precipitating cause for the DKA.  - Placed on DKA protocol, aggressive IV fluid hydration, IV insulin drip - Hemoglobin A1c pending - Gap closed, CBGs x4 improving, bicarbonate improving - Transition to subcutaneous insulin, Lantus 20 units (states takes 30 units at home), sliding scale insulin, wean off insulin drip - Discussed with surgery, recommended to keep nothing by mouth until surgical evaluation  Active Problems: Abscess of the left thigh - Examined the patient, she has hard indurated area on the left thigh about 4-5cm diameter, tender.  - CT of the left femur, surgery consulted - Placed on IV vancomycin and Zosyn per  pharmacy    Metabolic acidosis - Likely due to profound DKA and lactic acidosis and left thigh abscess - Improving    Acute encephalopathy -Likely due to #1, dehydration, infection - UA negative for UTI, CT head negative for any acute intracranial abnormalities - Resolved, patient alert awake and oriented    CKD (chronic kidney disease), stage III - Currently creatinine at baseline, continue aggressive IV fluid hydration    Essential hypertension - BP currently stable, not on any antihypertensives outpatient  Hypokalemia - Replaced  Code Status: Full code DVT Prophylaxis:  Lovenox  Family Communication: Discussed in detail with the patient, all imaging results, lab results explained to the patient and husband   Disposition Plan:   Time Spent in minutes  35 minutes  Procedures:  None  Consultants:   Gen. surgery  Antimicrobials:   IV vancomycin 6/24>  IV Zosyn 6/24>   Medications  Scheduled Meds: . enoxaparin (LOVENOX) injection  40 mg Subcutaneous Q24H  . insulin aspart  0-5 Units Subcutaneous QHS  . insulin aspart  0-9 Units Subcutaneous TID WC  . insulin glargine  20 Units Subcutaneous Daily  Continuous Infusions: . dextrose 5 % and 0.45% NaCl 75 mL/hr at 03/09/17 0931  . insulin (NOVOLIN-R) infusion 3.9 Units/hr (03/09/17 0931)  . piperacillin-tazobactam (ZOSYN)  IV    . potassium chloride    . vancomycin    . vancomycin     PRN Meds:.   Antibiotics   Anti-infectives    Start     Dose/Rate Route Frequency Ordered Stop   03/09/17 2200  vancomycin (VANCOCIN) IVPB 750 mg/150 ml premix     750 mg 150 mL/hr over 60 Minutes Intravenous Every 12 hours 03/09/17 1006     03/09/17 1030  vancomycin (VANCOCIN) 1,250 mg in sodium chloride 0.9 % 250 mL IVPB     1,250 mg 166.7 mL/hr over 90 Minutes Intravenous  Once 03/09/17 1006     03/09/17 1030  piperacillin-tazobactam (ZOSYN) IVPB 3.375 g     3.375 g 12.5 mL/hr over 240 Minutes Intravenous Every 8  hours 03/09/17 1006          Subjective:   Stephanie Huber was seen and examined today. Patient alert and oriented today, feeling much better. She reports tender "boil" on her left thigh for the last 3 days. Low-grade fever 3F.  Patient denies dizziness, chest pain, shortness of breath, abdominal pain, N/V/D/C, new weakness, numbess, tingling. No acute events overnight.    Objective:   Vitals:   03/09/17 0400 03/09/17 0500 03/09/17 0600 03/09/17 0732  BP: 113/70 132/84 122/70 120/70  Pulse: (!) 107 (!) 110 (!) 114 (!) 110  Resp: (!) 21 16 (!) 24 (!) 28  Temp: 98.3 F (36.8 C)   99 F (37.2 C)  TempSrc: Oral   Oral  SpO2: 99% 100% 100% 100%  Weight:      Height:        Intake/Output Summary (Last 24 hours) at 03/09/17 1010 Last data filed at 03/09/17 0600  Gross per 24 hour  Intake          3022.56 ml  Output             2050 ml  Net           972.56 ml     Wt Readings from Last 3 Encounters:  03/08/17 55.4 kg (122 lb 1 oz)  11/09/15 82.8 kg (182 lb 8.7 oz)  02/20/15 82 kg (180 lb 12.4 oz)     Exam  General: Alert and oriented x 3, NAD  Eyes: PERRLA, EOMI, Anicteric Sclera,  HEENT:  Atraumatic, normocephalic, normal oropharynx  Cardiovascular: S1 S2 auscultated, no rubs, murmurs or gallops. Regular rate and rhythm.  Respiratory: Clear to auscultation bilaterally, no wheezing, rales or rhonchi  Gastrointestinal: Soft, nontender, nondistended, + bowel sounds  Ext: no pedal edema bilaterally  Neuro: AAOx3, Cr N's II- XII. Strength 5/5 upper and lower extremities bilaterally, speech clear, sensations grossly intact  Musculoskeletal: No digital cyanosis, clubbing  Skin: Left exterior thigh, 4-5cm hard indurated area, tender   Psych: Normal affect and demeanor, alert and oriented x3    Data Reviewed:  I have personally reviewed following labs and imaging studies  Micro Results Recent Results (from the past 240 hour(s))  MRSA PCR Screening     Status:  None   Collection Time: 03/08/17  6:23 PM  Result Value Ref Range Status   MRSA by PCR NEGATIVE NEGATIVE Final    Comment:        The GeneXpert MRSA Assay (FDA approved for NASAL specimens only), is one component of a comprehensive  MRSA colonization surveillance program. It is not intended to diagnose MRSA infection nor to guide or monitor treatment for MRSA infections.   Culture, blood (routine x 2)     Status: None (Preliminary result)   Collection Time: 03/08/17  6:30 PM  Result Value Ref Range Status   Specimen Description BLOOD LEFT HAND  Final   Special Requests   Final    BOTTLES DRAWN AEROBIC AND ANAEROBIC Blood Culture adequate volume   Culture PENDING  Incomplete   Report Status PENDING  Incomplete  Culture, blood (routine x 2)     Status: None (Preliminary result)   Collection Time: 03/08/17  6:36 PM  Result Value Ref Range Status   Specimen Description BLOOD LEFT ARM  Final   Special Requests   Final    BOTTLES DRAWN AEROBIC AND ANAEROBIC Blood Culture adequate volume   Culture PENDING  Incomplete   Report Status PENDING  Incomplete    Radiology Reports Ct Head Wo Contrast  Result Date: 03/08/2017 CLINICAL DATA:  51 year old female with a history weakness and slurred speech EXAM: CT HEAD WITHOUT CONTRAST TECHNIQUE: Contiguous axial images were obtained from the base of the skull through the vertex without intravenous contrast. COMPARISON:  None. FINDINGS: Brain: No acute intracranial hemorrhage. No midline shift or mass effect. Gray-white differentiation maintained. Unremarkable appearance of the ventricular system. Vascular: Unremarkable. Skull: No acute fracture.  No aggressive bone lesion identified. Sinuses/Orbits: Unremarkable appearance of the orbits. Mastoid air cells clear. No middle ear effusion. No significant sinus disease. Other: None IMPRESSION: No CT evidence of acute intracranial abnormality. Aspects is 10 These results were called by telephone at the  time of interpretation on 03/08/2017 at 1:28 pm to Dr. Donnetta Hutching , who verbally acknowledged these results. Electronically Signed   By: Gilmer Mor D.O.   On: 03/08/2017 13:29   Dg Chest Portable 1 View  Result Date: 03/08/2017 CLINICAL DATA:  51 year old female with a history of dyspnea EXAM: PORTABLE CHEST 1 VIEW COMPARISON:  11/09/2015, 09/21/2006 FINDINGS: Cardiomediastinal silhouette unchanged in size and contour. No pneumothorax or pleural effusion.  No confluent airspace disease. No evidence of central vascular congestion or edema. No displaced fracture IMPRESSION: No radiographic evidence of acute cardiopulmonary disease Electronically Signed   By: Gilmer Mor D.O.   On: 03/08/2017 14:26    Lab Data:  CBC:  Recent Labs Lab 03/08/17 1309 03/08/17 1835  WBC 22.1* 18.3*  HGB 14.0 13.8  HCT 42.4 43.0  MCV 79.8 80.4  PLT 437* 312   Basic Metabolic Panel:  Recent Labs Lab 03/08/17 1309 03/08/17 1835 03/08/17 1836 03/08/17 2231 03/09/17 0237 03/09/17 0555  NA 133*  --  134* 131* 135 134*  K 4.2  --  4.4 3.2* 3.3* 3.2*  CL 101  --  111 111 113* 111  CO2 <7*  --  <7* 13* 15* 15*  GLUCOSE 348*  --  250* 165* 136* 144*  BUN 8  --  8 6 5* 5*  CREATININE 1.09* 1.11* 1.11* 0.76 0.61 0.62  CALCIUM 8.9  --  8.1* 8.1* 8.6* 8.7*   GFR: Estimated Creatinine Clearance: 73.6 mL/min (by C-G formula based on SCr of 0.62 mg/dL). Liver Function Tests:  Recent Labs Lab 03/08/17 1309  AST 16  ALT 12*  ALKPHOS 303*  BILITOT 1.4*  PROT 9.0*  ALBUMIN 3.7   No results for input(s): LIPASE, AMYLASE in the last 168 hours. No results for input(s): AMMONIA in the last 168 hours. Coagulation Profile:  No results for input(s): INR, PROTIME in the last 168 hours. Cardiac Enzymes: No results for input(s): CKTOTAL, CKMB, CKMBINDEX, TROPONINI in the last 168 hours. BNP (last 3 results) No results for input(s): PROBNP in the last 8760 hours. HbA1C: No results for input(s): HGBA1C in  the last 72 hours. CBG:  Recent Labs Lab 03/09/17 0449 03/09/17 0545 03/09/17 0635 03/09/17 0805 03/09/17 0929  GLUCAP 129* 146* 153* 188* 200*   Lipid Profile: No results for input(s): CHOL, HDL, LDLCALC, TRIG, CHOLHDL, LDLDIRECT in the last 72 hours. Thyroid Function Tests: No results for input(s): TSH, T4TOTAL, FREET4, T3FREE, THYROIDAB in the last 72 hours. Anemia Panel: No results for input(s): VITAMINB12, FOLATE, FERRITIN, TIBC, IRON, RETICCTPCT in the last 72 hours. Urine analysis:    Component Value Date/Time   COLORURINE STRAW (A) 03/08/2017 1327   APPEARANCEUR HAZY (A) 03/08/2017 1327   LABSPEC 1.017 03/08/2017 1327   PHURINE 5.0 03/08/2017 1327   GLUCOSEU >=500 (A) 03/08/2017 1327   HGBUR SMALL (A) 03/08/2017 1327   BILIRUBINUR NEGATIVE 03/08/2017 1327   KETONESUR 80 (A) 03/08/2017 1327   PROTEINUR 100 (A) 03/08/2017 1327   UROBILINOGEN 0.2 02/20/2015 1737   NITRITE NEGATIVE 03/08/2017 1327   LEUKOCYTESUR NEGATIVE 03/08/2017 1327     Ripudeep Rai M.D. [facility] 03/09/2017, 10:10 AM  Pager: [phone] Between 7am to 7pm - call Pager - [phone]  After 7pm go to [email or web address] - password [email or web address]  Call night coverage person covering after 7pm

## 2017-03-09 NOTE — Progress Notes (Signed)
Inpatient Diabetes Program Recommendations  AACE/ADA: New Consensus Statement on Inpatient Glycemic Control (2015)  Target Ranges:  Prepandial:   less than 140 mg/dL      Peak postprandial:   less than 180 mg/dL (1-2 hours)      Critically ill patients:  140 - 180 mg/dL   Lab Results  Component Value Date   GLUCAP 200 (H) 03/09/2017   HGBA1C >16.7 (H) 11/10/2015    Review of Glycemic ControlResults for Aldean JewettRICK, Teeghan D (MRN [id number]) as of 03/09/2017 09:43  Ref. Range 03/09/2017 05:45 03/09/2017 06:35 03/09/2017 08:05 03/09/2017 09:29  Glucose-Capillary Latest Ref Range: 65 - 99 mg/dL 308146 (H) 657153 (H) 846188 (H) 200 (H)    Diabetes history: Type 1 diabetes Outpatient Diabetes medications: Lantus 25 units daily, Novolog 5 units tid with meals Current orders for Inpatient glycemic control:  Transitioning off insulin drip to Lantus 20 units and Novolog sensitive tid with meals and HS  Inpatient Diabetes Program Recommendations:    Consider also adding Novolog meal coverage 3 units tid with meals.  Referral received.  DC will see patient on 03/10/17.    Thanks, Beryl MeagerJenny Crisol Muecke, RN, BC-ADM Inpatient Diabetes Coordinator Pager [phone] (8a-5p)

## 2017-03-09 NOTE — Consult Note (Signed)
[facility] Surgery Consult/Admission Note  Stephanie Huber 09/13/1966  [id number].    Requesting MD: Dr. Tana Coast Chief Complaint/Reason for Consult: left thigh abscess  HPI:   Pt is a 51 year old female with HTN, diabetes type 1 insulin-dependent who presented to the [facility] ED with complaints of weakness, slurred speech, AMS. She was found to be in DKA. Gap has been corrected and pt is alert. Patient is complaining of left thigh pain. We were consulted for possible abscess of left thigh. Patient states she noticed an area of her left thigh that was tender to touch roughly 3-4 days ago. She states mild pain worse with touch, nonradiating. No associated symptoms. She denies drainage. Patient denies incontinence, diarrhea, fever, chills, nausea, vomiting, CP, SOB.  ROS:  Review of Systems  Constitutional: Negative for chills and fever.  HENT: Negative for sore throat.   Respiratory: Negative for shortness of breath.   Cardiovascular: Negative for chest pain.  Gastrointestinal: Negative for abdominal pain, diarrhea, nausea and vomiting.  Genitourinary:       - incontinence   All other systems reviewed and are negative.    Family History  Problem Relation Age of Onset  . Dementia Unknown     Past Medical History:  Diagnosis Date  . CKD (chronic kidney disease), stage III   . Essential hypertension   . Type 1 diabetes mellitus ([place])     History reviewed. No pertinent surgical history.  Social History:  reports that she has been smoking.  She has never used smokeless tobacco. She reports that she does not drink alcohol or use drugs.  Allergies: No Known Allergies  Medications Prior to Admission  Medication Sig Dispense Refill  . blood glucose meter kit and supplies KIT Dispense based on patient and insurance preference. Use up to four times daily as directed. (FOR ICD-9 250.00, 250.01). 1 each 0  . insulin aspart (NOVOLOG) 100 UNIT/ML injection Inject 5 Units into the skin 3  (three) times daily with meals. 10 mL 11  . insulin glargine (LANTUS) 100 UNIT/ML injection Inject 0.25 mLs (25 Units total) into the skin daily. 10 mL 11  . Insulin Syringes, Disposable, U-100 1 ML MISC Use as directed 100 each 11  . metroNIDAZOLE (FLAGYL) 500 MG tablet Take 500 mg by mouth 2 (two) times daily. Starting on 02/25/17 to last 7 days  0    Blood pressure 120/70, pulse (!) 110, temperature 99 F (37.2 C), temperature source Oral, resp. rate (!) 28, height '5\' 8"'$  (1.727 m), weight 122 lb 1 oz (55.4 kg), SpO2 100 %.  Physical Exam  Constitutional: She is oriented to person, place, and time. Vital signs are normal. No distress.  Thin, AA female, in no distress, pleasant  HENT:  Head: Normocephalic and atraumatic.  Nose: Nose normal.  Mouth/Throat: Oropharynx is clear and moist. No oropharyngeal exudate.  Eyes: Conjunctivae are normal. Pupils are equal, round, and reactive to light. Right eye exhibits no discharge. Left eye exhibits no discharge. No scleral icterus.  Neck: Normal range of motion. Neck supple. No tracheal deviation present. No thyromegaly present.  Cardiovascular: Regular rhythm, normal heart sounds and intact distal pulses.  Tachycardia present.  Exam reveals no gallop and no friction rub.   No murmur heard. Pulses:      Radial pulses are 2+ on the right side, and 2+ on the left side.  Pulmonary/Chest: Effort normal and breath sounds normal. No accessory muscle usage. No respiratory distress. She has no wheezes.  She has no rales.  Abdominal: Soft. Normal appearance and bowel sounds are normal. She exhibits no distension. There is no tenderness. No hernia.  Genitourinary:  Genitourinary Comments: Labia appears red and slightly inflamed abscess noted to left posterior inner thigh, roughly 6cm of induration that tracks anteriorly, surrounding erythema   Musculoskeletal: Normal range of motion. She exhibits no edema, tenderness or deformity.  Lymphadenopathy:    She  has no cervical adenopathy.  Neurological: She is alert and oriented to person, place, and time.  Skin: Skin is warm and dry. She is not diaphoretic.  Psychiatric: Mood and affect normal.  Nursing note and vitals reviewed.   Results for orders placed or performed during the hospital encounter of 03/08/17 (from the past 48 hour(s))  CBG monitoring, ED     Status: Abnormal   Collection Time: 03/08/17 12:54 PM  Result Value Ref Range   Glucose-Capillary 320 (H) 65 - 99 mg/dL  POC CBG, ED     Status: Abnormal   Collection Time: 03/08/17  1:08 PM  Result Value Ref Range   Glucose-Capillary 356 (H) 65 - 99 mg/dL  CBC     Status: Abnormal   Collection Time: 03/08/17  1:09 PM  Result Value Ref Range   WBC 22.1 (H) 4.0 - 10.5 K/uL   RBC 5.31 (H) 3.87 - 5.11 MIL/uL   Hemoglobin 14.0 12.0 - 15.0 g/dL   HCT 39.1 97.6 - 94.5 %   MCV 79.8 78.0 - 100.0 fL   MCH 26.4 26.0 - 34.0 pg   MCHC 33.0 30.0 - 36.0 g/dL   RDW 09.2 01.1 - 67.0 %   Platelets 437 (H) 150 - 400 K/uL  Comprehensive metabolic panel     Status: Abnormal   Collection Time: 03/08/17  1:09 PM  Result Value Ref Range   Sodium 133 (L) 135 - 145 mmol/L   Potassium 4.2 3.5 - 5.1 mmol/L   Chloride 101 101 - 111 mmol/L   CO2 <7 (L) 22 - 32 mmol/L   Glucose, Bld 348 (H) 65 - 99 mg/dL   BUN 8 6 - 20 mg/dL   Creatinine, Ser 1.59 (H) 0.44 - 1.00 mg/dL   Calcium 8.9 8.9 - 76.1 mg/dL   Total Protein 9.0 (H) 6.5 - 8.1 g/dL   Albumin 3.7 3.5 - 5.0 g/dL   AST 16 15 - 41 U/L   ALT 12 (L) 14 - 54 U/L   Alkaline Phosphatase 303 (H) 38 - 126 U/L   Total Bilirubin 1.4 (H) 0.3 - 1.2 mg/dL   GFR calc non Af Amer 58 (L) >60 mL/min   GFR calc Af Amer >60 >60 mL/min    Comment: (NOTE) The eGFR has been calculated using the CKD EPI equation. This calculation has not been validated in all clinical situations. eGFR's persistently <60 mL/min signify possible Chronic Kidney Disease.   I-Stat Troponin, ED (not at [facility])     Status: None    Collection Time: 03/08/17  1:12 PM  Result Value Ref Range   Troponin i, poc 0.02 0.00 - 0.08 ng/mL   Comment 3            Comment: Due to the release kinetics of cTnI, a negative result within the first hours of the onset of symptoms does not rule out myocardial infarction with certainty. If myocardial infarction is still suspected, repeat the test at appropriate intervals.   Urinalysis, Routine w reflex microscopic     Status: Abnormal   Collection  Time: 03/08/17  1:27 PM  Result Value Ref Range   Color, Urine STRAW (A) YELLOW   APPearance HAZY (A) CLEAR   Specific Gravity, Urine 1.017 1.005 - 1.030   pH 5.0 5.0 - 8.0   Glucose, UA >=500 (A) NEGATIVE mg/dL   Hgb urine dipstick SMALL (A) NEGATIVE   Bilirubin Urine NEGATIVE NEGATIVE   Ketones, ur 80 (A) NEGATIVE mg/dL   Protein, ur 100 (A) NEGATIVE mg/dL   Nitrite NEGATIVE NEGATIVE   Leukocytes, UA NEGATIVE NEGATIVE   RBC / HPF 6-30 0 - 5 RBC/hpf   WBC, UA 0-5 0 - 5 WBC/hpf   Bacteria, UA RARE (A) NONE SEEN   Squamous Epithelial / LPF 0-5 (A) NONE SEEN   Mucous PRESENT    Hyaline Casts, UA PRESENT   I-Stat CG4 Lactic Acid, ED     Status: Abnormal   Collection Time: 03/08/17  1:32 PM  Result Value Ref Range   Lactic Acid, Venous 2.78 (HH) 0.5 - 1.9 mmol/L   Comment NOTIFIED PHYSICIAN   POC CBG, ED     Status: Abnormal   Collection Time: 03/08/17  3:07 PM  Result Value Ref Range   Glucose-Capillary 300 (H) 65 - 99 mg/dL  I-Stat Venous Blood Gas, ED (order at [facility] and [facility] only)     Status: Abnormal   Collection Time: 03/08/17  3:50 PM  Result Value Ref Range   pH, Ven 6.923 (LL) 7.250 - 7.430   pCO2, Ven 19.7 (LL) 44.0 - 60.0 mmHg   pO2, Ven 33.0 32.0 - 45.0 mmHg   Bicarbonate 4.1 (L) 20.0 - 28.0 mmol/L   TCO2 <5 0 - 100 mmol/L   O2 Saturation 34.0 %   Acid-base deficit 27.0 (H) 0.0 - 2.0 mmol/L   Patient temperature HIDE    Sample type VENOUS    Comment NOTIFIED PHYSICIAN   I-Stat CG4 Lactic Acid, ED     Status: None    Collection Time: 03/08/17  4:05 PM  Result Value Ref Range   Lactic Acid, Venous 1.64 0.5 - 1.9 mmol/L  CBG monitoring, ED     Status: Abnormal   Collection Time: 03/08/17  4:35 PM  Result Value Ref Range   Glucose-Capillary 245 (H) 65 - 99 mg/dL   Comment 1 Notify RN    Comment 2 Document in Chart   Glucose, capillary     Status: Abnormal   Collection Time: 03/08/17  5:58 PM  Result Value Ref Range   Glucose-Capillary 241 (H) 65 - 99 mg/dL  MRSA PCR Screening     Status: None   Collection Time: 03/08/17  6:23 PM  Result Value Ref Range   MRSA by PCR NEGATIVE NEGATIVE    Comment:        The GeneXpert MRSA Assay (FDA approved for NASAL specimens only), is one component of a comprehensive MRSA colonization surveillance program. It is not intended to diagnose MRSA infection nor to guide or monitor treatment for MRSA infections.   Culture, blood (routine x 2)     Status: None (Preliminary result)   Collection Time: 03/08/17  6:30 PM  Result Value Ref Range   Specimen Description BLOOD LEFT HAND    Special Requests      BOTTLES DRAWN AEROBIC AND ANAEROBIC Blood Culture adequate volume   Culture PENDING    Report Status PENDING   Procalcitonin - Baseline     Status: None   Collection Time: 03/08/17  6:35 PM  Result Value Ref  Range   Procalcitonin 1.45 ng/mL    Comment:        Interpretation: PCT > 0.5 ng/mL and <= 2 ng/mL: Systemic infection (sepsis) is possible, but other conditions are known to elevate PCT as well. (NOTE)         ICU PCT Algorithm               Non ICU PCT Algorithm    ----------------------------     ------------------------------         PCT < 0.25 ng/mL                 PCT < 0.1 ng/mL     Stopping of antibiotics            Stopping of antibiotics       strongly encouraged.               strongly encouraged.    ----------------------------     ------------------------------       PCT level decrease by               PCT < 0.25 ng/mL       >= 80%  from peak PCT       OR PCT 0.25 - 0.5 ng/mL          Stopping of antibiotics                                             encouraged.     Stopping of antibiotics           encouraged.    ----------------------------     ------------------------------       PCT level decrease by              PCT >= 0.25 ng/mL       < 80% from peak PCT        AND PCT >= 0.5 ng/mL             Continuing antibiotics                                              encouraged.       Continuing antibiotics            encouraged.    ----------------------------     ------------------------------     PCT level increase compared          PCT > 0.5 ng/mL         with peak PCT AND          PCT >= 0.5 ng/mL             Escalation of antibiotics                                          strongly encouraged.      Escalation of antibiotics        strongly encouraged.   HIV antibody (Routine Testing)     Status: None   Collection Time: 03/08/17  6:35 PM  Result Value Ref Range   HIV Screen 4th Generation wRfx Non Reactive Non Reactive  Comment: (NOTE) Performed At: [facility] [place], [place] [id number] Lindon Romp MD FA:[id number]   CBC     Status: Abnormal   Collection Time: 03/08/17  6:35 PM  Result Value Ref Range   WBC 18.3 (H) 4.0 - 10.5 K/uL   RBC 5.35 (H) 3.87 - 5.11 MIL/uL   Hemoglobin 13.8 12.0 - 15.0 g/dL   HCT 43.0 36.0 - 46.0 %   MCV 80.4 78.0 - 100.0 fL   MCH 25.8 (L) 26.0 - 34.0 pg   MCHC 32.1 30.0 - 36.0 g/dL   RDW 14.6 11.5 - 15.5 %   Platelets 312 150 - 400 K/uL    Comment: PLATELET COUNT CONFIRMED BY SMEAR REPEATED TO VERIFY   Creatinine, serum     Status: Abnormal   Collection Time: 03/08/17  6:35 PM  Result Value Ref Range   Creatinine, Ser 1.11 (H) 0.44 - 1.00 mg/dL   GFR calc non Af Amer 57 (L) >60 mL/min   GFR calc Af Amer >60 >60 mL/min    Comment: (NOTE) The eGFR has been calculated using the CKD EPI equation. This calculation has not been  validated in all clinical situations. eGFR's persistently <60 mL/min signify possible Chronic Kidney Disease.   Culture, blood (routine x 2)     Status: None (Preliminary result)   Collection Time: 03/08/17  6:36 PM  Result Value Ref Range   Specimen Description BLOOD LEFT ARM    Special Requests      BOTTLES DRAWN AEROBIC AND ANAEROBIC Blood Culture adequate volume   Culture PENDING    Report Status PENDING   Basic metabolic panel     Status: Abnormal   Collection Time: 03/08/17  6:36 PM  Result Value Ref Range   Sodium 134 (L) 135 - 145 mmol/L   Potassium 4.4 3.5 - 5.1 mmol/L   Chloride 111 101 - 111 mmol/L   CO2 <7 (L) 22 - 32 mmol/L   Glucose, Bld 250 (H) 65 - 99 mg/dL   BUN 8 6 - 20 mg/dL   Creatinine, Ser 1.11 (H) 0.44 - 1.00 mg/dL   Calcium 8.1 (L) 8.9 - 10.3 mg/dL   GFR calc non Af Amer 57 (L) >60 mL/min   GFR calc Af Amer >60 >60 mL/min    Comment: (NOTE) The eGFR has been calculated using the CKD EPI equation. This calculation has not been validated in all clinical situations. eGFR's persistently <60 mL/min signify possible Chronic Kidney Disease.   Glucose, capillary     Status: Abnormal   Collection Time: 03/08/17  7:05 PM  Result Value Ref Range   Glucose-Capillary 248 (H) 65 - 99 mg/dL  Glucose, capillary     Status: Abnormal   Collection Time: 03/08/17  8:27 PM  Result Value Ref Range   Glucose-Capillary 214 (H) 65 - 99 mg/dL  Glucose, capillary     Status: Abnormal   Collection Time: 03/08/17  9:24 PM  Result Value Ref Range   Glucose-Capillary 181 (H) 65 - 99 mg/dL  Glucose, capillary     Status: Abnormal   Collection Time: 03/08/17 10:25 PM  Result Value Ref Range   Glucose-Capillary 195 (H) 65 - 99 mg/dL  Basic metabolic panel     Status: Abnormal   Collection Time: 03/08/17 10:31 PM  Result Value Ref Range   Sodium 131 (L) 135 - 145 mmol/L   Potassium 3.2 (L) 3.5 - 5.1 mmol/L    Comment: DELTA CHECK NOTED  Chloride 111 101 - 111 mmol/L   CO2  13 (L) 22 - 32 mmol/L   Glucose, Bld 165 (H) 65 - 99 mg/dL   BUN 6 6 - 20 mg/dL   Creatinine, Ser 0.76 0.44 - 1.00 mg/dL   Calcium 8.1 (L) 8.9 - 10.3 mg/dL   GFR calc non Af Amer >60 >60 mL/min   GFR calc Af Amer >60 >60 mL/min    Comment: (NOTE) The eGFR has been calculated using the CKD EPI equation. This calculation has not been validated in all clinical situations. eGFR's persistently <60 mL/min signify possible Chronic Kidney Disease.    Anion gap 7 5 - 15  Glucose, capillary     Status: Abnormal   Collection Time: 03/08/17 11:26 PM  Result Value Ref Range   Glucose-Capillary 165 (H) 65 - 99 mg/dL   Comment 1 Notify RN   Glucose, capillary     Status: Abnormal   Collection Time: 03/09/17 12:22 AM  Result Value Ref Range   Glucose-Capillary 136 (H) 65 - 99 mg/dL  Glucose, capillary     Status: Abnormal   Collection Time: 03/09/17  1:28 AM  Result Value Ref Range   Glucose-Capillary 131 (H) 65 - 99 mg/dL   Comment 1 Notify RN   Basic metabolic panel     Status: Abnormal   Collection Time: 03/09/17  2:37 AM  Result Value Ref Range   Sodium 135 135 - 145 mmol/L   Potassium 3.3 (L) 3.5 - 5.1 mmol/L   Chloride 113 (H) 101 - 111 mmol/L   CO2 15 (L) 22 - 32 mmol/L   Glucose, Bld 136 (H) 65 - 99 mg/dL   BUN 5 (L) 6 - 20 mg/dL   Creatinine, Ser 0.61 0.44 - 1.00 mg/dL   Calcium 8.6 (L) 8.9 - 10.3 mg/dL   GFR calc non Af Amer >60 >60 mL/min   GFR calc Af Amer >60 >60 mL/min    Comment: (NOTE) The eGFR has been calculated using the CKD EPI equation. This calculation has not been validated in all clinical situations. eGFR's persistently <60 mL/min signify possible Chronic Kidney Disease.    Anion gap 7 5 - 15  Glucose, capillary     Status: Abnormal   Collection Time: 03/09/17  2:42 AM  Result Value Ref Range   Glucose-Capillary 127 (H) 65 - 99 mg/dL  Glucose, capillary     Status: Abnormal   Collection Time: 03/09/17  3:48 AM  Result Value Ref Range   Glucose-Capillary  127 (H) 65 - 99 mg/dL  Glucose, capillary     Status: Abnormal   Collection Time: 03/09/17  4:49 AM  Result Value Ref Range   Glucose-Capillary 129 (H) 65 - 99 mg/dL  Glucose, capillary     Status: Abnormal   Collection Time: 03/09/17  5:45 AM  Result Value Ref Range   Glucose-Capillary 146 (H) 65 - 99 mg/dL  Basic metabolic panel     Status: Abnormal   Collection Time: 03/09/17  5:55 AM  Result Value Ref Range   Sodium 134 (L) 135 - 145 mmol/L   Potassium 3.2 (L) 3.5 - 5.1 mmol/L   Chloride 111 101 - 111 mmol/L   CO2 15 (L) 22 - 32 mmol/L   Glucose, Bld 144 (H) 65 - 99 mg/dL   BUN 5 (L) 6 - 20 mg/dL   Creatinine, Ser 0.62 0.44 - 1.00 mg/dL   Calcium 8.7 (L) 8.9 - 10.3 mg/dL   GFR calc  non Af Amer >60 >60 mL/min   GFR calc Af Amer >60 >60 mL/min    Comment: (NOTE) The eGFR has been calculated using the CKD EPI equation. This calculation has not been validated in all clinical situations. eGFR's persistently <60 mL/min signify possible Chronic Kidney Disease.    Anion gap 8 5 - 15  Glucose, capillary     Status: Abnormal   Collection Time: 03/09/17  6:35 AM  Result Value Ref Range   Glucose-Capillary 153 (H) 65 - 99 mg/dL  Glucose, capillary     Status: Abnormal   Collection Time: 03/09/17  8:05 AM  Result Value Ref Range   Glucose-Capillary 188 (H) 65 - 99 mg/dL  Glucose, capillary     Status: Abnormal   Collection Time: 03/09/17  9:29 AM  Result Value Ref Range   Glucose-Capillary 200 (H) 65 - 99 mg/dL  Glucose, capillary     Status: Abnormal   Collection Time: 03/09/17 10:41 AM  Result Value Ref Range   Glucose-Capillary 203 (H) 65 - 99 mg/dL   Ct Head Wo Contrast  Result Date: 03/08/2017 CLINICAL DATA:  51 year old female with a history weakness and slurred speech EXAM: CT HEAD WITHOUT CONTRAST TECHNIQUE: Contiguous axial images were obtained from the base of the skull through the vertex without intravenous contrast. COMPARISON:  None. FINDINGS: Brain: No acute  intracranial hemorrhage. No midline shift or mass effect. Gray-white differentiation maintained. Unremarkable appearance of the ventricular system. Vascular: Unremarkable. Skull: No acute fracture.  No aggressive bone lesion identified. Sinuses/Orbits: Unremarkable appearance of the orbits. Mastoid air cells clear. No middle ear effusion. No significant sinus disease. Other: None IMPRESSION: No CT evidence of acute intracranial abnormality. Aspects is 10 These results were called by telephone at the time of interpretation on 03/08/2017 at 1:28 pm to Dr. Nat Christen , who verbally acknowledged these results. Electronically Signed   By: Corrie Mckusick D.O.   On: 03/08/2017 13:29   Dg Chest Portable 1 View  Result Date: 03/08/2017 CLINICAL DATA:  51 year old female with a history of dyspnea EXAM: PORTABLE CHEST 1 VIEW COMPARISON:  11/09/2015, 09/21/2006 FINDINGS: Cardiomediastinal silhouette unchanged in size and contour. No pneumothorax or pleural effusion.  No confluent airspace disease. No evidence of central vascular congestion or edema. No displaced fracture IMPRESSION: No radiographic evidence of acute cardiopulmonary disease Electronically Signed   By: Corrie Mckusick D.O.   On: 03/08/2017 14:26      Assessment/Plan  HTN DKA DM type I CKD - creatinine 0.62  Left thigh abscess - area of induration of left thigh - Keep pt NPO - pending CT of left femur to further evaluate abscess - WBC 11.1 down from 18.3 on 6/23 - IV Vanc and Zosyn  Plan: Will likely take pt to OR this afternoon for I&D but will wait for CT results   Thank you for the consult.  Kalman Drape, [facility] 03/09/2017, 10:48 AM Pager: [phone] Consults: [phone] Mon-Fri 7:00 am-4:30 pm Sat-Sun 7:00 am-11:30 am

## 2017-03-09 NOTE — Progress Notes (Signed)
Pharmacy Antibiotic Note  Stephanie Huber is a 51 y.o. female admitted on 03/08/2017 with wound infection with left thigh abscess.  Pharmacy has been consulted for vanc/zosyn dosing. -WBC 22.1 >> 18.3, LA 2.78 >> 1.64, afebrile this admission -SCr 1.11>>0.62, CrCl ~74  Plan: -Vanc 1250mg  x1, then 750mg  q12h -Zosyn 3.375g q8h -Monitor renal function, cultures, LOT, VT at steady state  Height: 5\' 8"  (172.7 cm) Weight: 122 lb 1 oz (55.4 kg) IBW/kg (Calculated) : 63.9  Temp (24hrs), Avg:97.9 F (36.6 C), Min:97.3 F (36.3 C), Max:99 F (37.2 C)   Recent Labs Lab 03/08/17 1309 03/08/17 1332 03/08/17 1605 03/08/17 1835 03/08/17 1836 03/08/17 2231 03/09/17 0237 03/09/17 0555  WBC 22.1*  --   --  18.3*  --   --   --   --   CREATININE 1.09*  --   --  1.11* 1.11* 0.76 0.61 0.62  LATICACIDVEN  --  2.78* 1.64  --   --   --   --   --     Estimated Creatinine Clearance: 73.6 mL/min (by C-G formula based on SCr of 0.62 mg/dL).    No Known Allergies  Vanc 6/24 >> Zosyn 6/24 >>  -6/23 BCx: sent -6/23 MRSA PCR: negative -6/23 UCx: sent  Thank you for allowing pharmacy to be a part of this patient's care.  Gwyndolyn KaufmanKai Taeshaun Rames Bernette Redbird(Kenny), PharmD  PGY1 Pharmacy Resident Pager: [phone] 03/09/2017 10:07 AM

## 2017-03-10 LAB — BASIC METABOLIC PANEL
Anion gap: 4 — ABNORMAL LOW (ref 5–15)
CHLORIDE: 110 mmol/L (ref 101–111)
CO2: 20 mmol/L — AB (ref 22–32)
CREATININE: 0.58 mg/dL (ref 0.44–1.00)
Calcium: 8.5 mg/dL — ABNORMAL LOW (ref 8.9–10.3)
GFR calc Af Amer: >60 mL/min (ref >60)
GFR calc non Af Amer: >60 mL/min (ref >60)
Glucose, Bld: 207 mg/dL — ABNORMAL HIGH (ref 65–99)
Potassium: 2.9 mmol/L — ABNORMAL LOW (ref 3.5–5.1)
Sodium: 134 mmol/L — ABNORMAL LOW (ref 135–145)

## 2017-03-10 LAB — GLUCOSE, CAPILLARY
GLUCOSE-CAPILLARY: 118 mg/dL — AB (ref 65–99)
Glucose-Capillary: 150 mg/dL — ABNORMAL HIGH (ref 65–99)
Glucose-Capillary: 157 mg/dL — ABNORMAL HIGH (ref 65–99)
Glucose-Capillary: 161 mg/dL — ABNORMAL HIGH (ref 65–99)
Glucose-Capillary: 165 mg/dL — ABNORMAL HIGH (ref 65–99)

## 2017-03-10 LAB — CBC
HEMATOCRIT: 33.2 % — AB (ref 36.0–46.0)
Hemoglobin: 10.8 g/dL — ABNORMAL LOW (ref 12.0–15.0)
MCH: 24.8 pg — AB (ref 26.0–34.0)
MCHC: 32.5 g/dL (ref 30.0–36.0)
MCV: 76.1 fL — AB (ref 78.0–100.0)
Platelets: 294 10*3/uL (ref 150–400)
RBC: 4.36 MIL/uL (ref 3.87–5.11)
RDW: 14.6 % (ref 11.5–15.5)
WBC: 9.2 10*3/uL (ref 4.0–10.5)

## 2017-03-10 LAB — HEMOGLOBIN A1C
Hgb A1c MFr Bld: >15.5 % — ABNORMAL HIGH (ref 4.8–5.6)
Mean Plasma Glucose: >398 mg/dL

## 2017-03-10 LAB — PROCALCITONIN: Procalcitonin: 1.01 ng/mL

## 2017-03-10 LAB — POTASSIUM: Potassium: 3 mmol/L — ABNORMAL LOW (ref 3.5–5.1)

## 2017-03-10 MED ORDER — POTASSIUM CHLORIDE CRYS ER 20 MEQ PO TBCR
40.0000 meq | EXTENDED_RELEASE_TABLET | ORAL | Status: AC
  Administered 2017-03-10 (×2): 40 meq via ORAL
  Filled 2017-03-10 (×2): qty 2

## 2017-03-10 MED ORDER — INSULIN ASPART 100 UNIT/ML ~~LOC~~ SOLN
5.0000 [IU] | Freq: Three times a day (TID) | SUBCUTANEOUS | Status: DC
  Administered 2017-03-10: 5 [IU] via SUBCUTANEOUS
  Administered 2017-03-10: 2 [IU] via SUBCUTANEOUS
  Administered 2017-03-10 – 2017-03-13 (×6): 5 [IU] via SUBCUTANEOUS

## 2017-03-10 MED ORDER — LIVING WELL WITH DIABETES BOOK
Freq: Once | Status: AC
  Administered 2017-03-10: 1
  Filled 2017-03-10: qty 1

## 2017-03-10 MED ORDER — GLUCERNA SHAKE PO LIQD
237.0000 mL | Freq: Three times a day (TID) | ORAL | Status: DC
  Administered 2017-03-10 – 2017-03-13 (×8): 237 mL via ORAL

## 2017-03-10 MED ORDER — INSULIN GLARGINE 100 UNIT/ML ~~LOC~~ SOLN
25.0000 [IU] | Freq: Every day | SUBCUTANEOUS | Status: DC
  Administered 2017-03-10 – 2017-03-12 (×3): 25 [IU] via SUBCUTANEOUS
  Filled 2017-03-10 (×4): qty 0.25

## 2017-03-10 MED ORDER — SODIUM CHLORIDE 0.9 % IV SOLN
INTRAVENOUS | Status: DC
  Administered 2017-03-10: 75 mL/h via INTRAVENOUS
  Administered 2017-03-11: 1000 mL via INTRAVENOUS
  Administered 2017-03-11 – 2017-03-12 (×2): via INTRAVENOUS

## 2017-03-10 MED ORDER — CEFAZOLIN SODIUM-DEXTROSE 2-4 GM/100ML-% IV SOLN
2.0000 g | Freq: Three times a day (TID) | INTRAVENOUS | Status: DC
  Administered 2017-03-10 – 2017-03-13 (×10): 2 g via INTRAVENOUS
  Filled 2017-03-10 (×12): qty 100

## 2017-03-10 NOTE — Progress Notes (Addendum)
Initial Nutrition Assessment  DOCUMENTATION CODES:   Non-severe (moderate) malnutrition in context of chronic illness  INTERVENTION:    Glucerna Shake po TID, each supplement provides 220 kcal and 10 grams of protein  NUTRITION DIAGNOSIS:   Malnutrition (moderate) related to chronic illness (uncontrolled DM) as evidenced by moderate depletions of muscle mass, moderate depletion of body fat  GOAL:   Patient will meet greater than or equal to 90% of their needs  MONITOR:   PO intake, Supplement acceptance, Labs, Weight trends, Skin, I & O's  REASON FOR ASSESSMENT:   Consult Diet education  ASSESSMENT:   51 year old Female with CKD Stage III, hypertension, type 1 diabetes, insulin-dependent presented to ED with nausea, vomiting, generalized weakness and slurred speech.  Pt talking on her cell phone upon visit.  Minimally engaged with RD interview questions. Reports her appetite is ok.  Had some breakfast this AM (eggs and bacon). Medications reviewed. On Phase 1 DKA Protocol. Doesn't know if she's lost weight recently. CBG's [id number]. Lab Results  Component Value Date   HGBA1C >15.5 (H) 03/08/2017   Nutrition-Focused physical exam completed. Findings are moderate fat depletion, moderate muscle depletion, and no edema.   Diet Order:  Diet Carb Modified Fluid consistency: Thin; Room service appropriate? Yes Diet NPO time specified  Skin:   (abscess of L thigh)  Last BM:  6/22  Height:   Ht Readings from Last 1 Encounters:  03/08/17 5\' 8"  (1.727 m)   Weight:   Wt Readings from Last 1 Encounters:  03/08/17 122 lb 1 oz (55.4 kg)   Ideal Body Weight:  63.6 kg  BMI:  Body mass index is 18.56 kg/m.  Estimated Nutritional Needs:   Kcal:  1600-1800  Protein:  80-95 gm  Fluid:  1.6-1.8 L  EDUCATION NEEDS:   Education needs addressed  Provided "Carbohydrate Counting for People with Diabetes" handout from the Academy of Nutrition and Dietetics  Maureen ChattersKatie  Aliyah Abeyta, RD, LDN Pager #: [phone] After-Hours Pager #: [phone]

## 2017-03-10 NOTE — Progress Notes (Signed)
Patient to transfer to 5W-03.  Report called to receiving RN.

## 2017-03-10 NOTE — Progress Notes (Signed)
Inpatient Diabetes Program Recommendations  AACE/ADA: New Consensus Statement on Inpatient Glycemic Control (2015)  Target Ranges:  Prepandial:   less than 140 mg/dL      Peak postprandial:   less than 180 mg/dL (1-2 hours)      Critically ill patients:  140 - 180 mg/dL   Lab Results  Component Value Date   GLUCAP 150 (H) 03/10/2017   HGBA1C >15.5 (H) 03/08/2017    Review of Glycemic Control:  Results for Stephanie Huber, Stephanie Huber (MRN [id number]) as of 03/10/2017 13:42  Ref. Range 03/09/2017 21:24 03/09/2017 23:59 03/10/2017 08:03 03/10/2017 11:49  Glucose-Capillary Latest Ref Range: 65 - 99 mg/dL 409100 (H) 811161 (H) 914165 (H) 150 (H)    Diabetes history: Type 1 diabetes Outpatient Diabetes medications: Lantus 30 units q day, Novolog 5 units tid with meals Current orders for Inpatient glycemic control:  Lantus 25 units daily, Novolog sensitive tid with meals and HS, Novolog 5 units tid with meals  Inpatient Diabetes Program Recommendations:    Spoke with patient regarding admit with DKA.  I asked her what type of diabetes she has and she states "I don't know".  She was diagnosed 2-3 years ago and has always been on insulin.  She states that she lives with her daughter and friend.  She has been taking her Lantus but not the Novolog.  I explained how both insulins work and that she needs the Novolog to cover her food.  She nodded her head and answered yes.  We discussed complications of uncontrolled diabetes and the importance of checking blood sugars.  She does not currently have and meter and has not been monitoring at all.  I asked if she knew what a normal blood sugars was and she states "No".  I told her that 80-130 mg/dL fasting and no greater than 180 mg/dL after eating.  We discussed her A1C and that it indicates extremely high blood sugars over the past 2-3 months.  Also discussed goal A1C.  Patient nodded and listened however not sure whether she truly understands.  We did discuss hypoglycemia signs  and symptoms and treatment- she was able to say that if her blood sugar is low she would eat candy.  We also discussed high blood sugars and what she needs to do.  I am unclear when her last PCP visit was?  Will order survival skill education, videos and "Living well with diabetes booklet".    Thanks, Beryl MeagerJenny Hovanes Hymas, RN, BC-ADM Inpatient Diabetes Coordinator Pager [phone] (8a-5p)

## 2017-03-10 NOTE — Progress Notes (Signed)
[facility] Progress Note      Subjective:  CC; left thigh pain  Pt states pain in left thigh is slightly better than yesterday. Pt is eating okay. No acute events overnight.   Objective: Vital signs in last 24 hours: Temp:  [97.7 F (36.5 C)-99.7 F (37.6 C)] 97.7 F (36.5 C) (06/25 0804) Pulse Rate:  [103-112] 112 (06/25 0310) Resp:  [17-22] 22 (06/25 0310) BP: (96-129)/(59-76) 126/75 (06/25 0804) SpO2:  [97 %-100 %] 97 % (06/25 0310) Last BM Date: 03/07/17  Intake/Output from previous day: 06/24 0701 - 06/25 0700 In: 550 [IV Piggyback:550] Out: 1050 [Urine:1050] Intake/Output this shift: No intake/output data recorded.  PE: Gen:  Alert, NAD, pleasant, cooperative, well appearing Card:  RRR, no M/G/R heard Pulm:  Rate and effort normal Skin: warm and dry Genitourinary: Labia appears red and slightly inflamed Large area of induration noted to left inner proximal thigh, roughly 6cm of induration that tracks anteriorly, mild surrounding erythema, no area of fluctuance noted, TTP   Musculoskeletal: Normal range of motion. She exhibits no edema, tenderness or deformity.   Lab Results:   Recent Labs  03/09/17 0945 03/10/17 0502  WBC 11.1* 9.2  HGB 12.8 10.8*  HCT 38.3 33.2*  PLT 288 294   BMET  Recent Labs  03/09/17 0945 03/10/17 0502  NA 133* 134*  K 3.4* 2.9*  CL 110 110  CO2 13* 20*  GLUCOSE 198* 207*  BUN <5* <5*  CREATININE 0.62 0.58  CALCIUM 8.8* 8.5*   PT/INR No results for input(s): LABPROT, INR in the last 72 hours. CMP     Component Value Date/Time   NA 134 (L) 03/10/2017 0502   K 2.9 (L) 03/10/2017 0502   CL 110 03/10/2017 0502   CO2 20 (L) 03/10/2017 0502   GLUCOSE 207 (H) 03/10/2017 0502   BUN <5 (L) 03/10/2017 0502   CREATININE 0.58 03/10/2017 0502   CALCIUM 8.5 (L) 03/10/2017 0502   PROT 9.0 (H) 03/08/2017 1309   ALBUMIN 3.7 03/08/2017 1309   AST 16 03/08/2017 1309   ALT 12 (L) 03/08/2017 1309   ALKPHOS  303 (H) 03/08/2017 1309   BILITOT 1.4 (H) 03/08/2017 1309   GFRNONAA >60 03/10/2017 0502   GFRAA >60 03/10/2017 0502   Lipase  No results found for: LIPASE  Studies/Results: Ct Head Wo Contrast  Result Date: 03/08/2017 CLINICAL DATA:  51 year old female with a history weakness and slurred speech EXAM: CT HEAD WITHOUT CONTRAST TECHNIQUE: Contiguous axial images were obtained from the base of the skull through the vertex without intravenous contrast. COMPARISON:  None. FINDINGS: Brain: No acute intracranial hemorrhage. No midline shift or mass effect. Gray-white differentiation maintained. Unremarkable appearance of the ventricular system. Vascular: Unremarkable. Skull: No acute fracture.  No aggressive bone lesion identified. Sinuses/Orbits: Unremarkable appearance of the orbits. Mastoid air cells clear. No middle ear effusion. No significant sinus disease. Other: None IMPRESSION: No CT evidence of acute intracranial abnormality. Aspects is 10 These results were called by telephone at the time of interpretation on 03/08/2017 at 1:28 pm to Dr. Donnetta HutchingBRIAN COOK , who verbally acknowledged these results. Electronically Signed   By: Gilmer MorJaime  Wagner D.O.   On: 03/08/2017 13:29   Ct Femur Left W Contrast  Result Date: 03/09/2017 CLINICAL DATA:  Abscess.  Pain. EXAM: CT OF THE LOWER RIGHT EXTREMITY WITH CONTRAST TECHNIQUE: Multidetector CT imaging of the lower right extremity was performed according to the standard protocol following intravenous contrast administration. COMPARISON:  None. CONTRAST:  100 cc Isovue-300 FINDINGS: Bones/Joint/Cartilage Normal. Soft tissues There is abnormal soft tissue stranding in the subcutaneous fat at the medial aspect of the proximal left thigh superficial to the left sartorius muscle. The soft tissue stranding extends around the sartorius muscle but there is no abscess. No other abnormality. IMPRESSION: Findings consistent with cellulitis of the inner aspect of the proximal left  thigh. Possible involvement of the adjacent sartorius muscle consistent with focal myositis. Electronically Signed   By: Francene Boyers M.D.   On: 03/09/2017 14:27   Dg Chest Portable 1 View  Result Date: 03/08/2017 CLINICAL DATA:  51 year old female with a history of dyspnea EXAM: PORTABLE CHEST 1 VIEW COMPARISON:  11/09/2015, 09/21/2006 FINDINGS: Cardiomediastinal silhouette unchanged in size and contour. No pneumothorax or pleural effusion.  No confluent airspace disease. No evidence of central vascular congestion or edema. No displaced fracture IMPRESSION: No radiographic evidence of acute cardiopulmonary disease Electronically Signed   By: Gilmer Mor D.O.   On: 03/08/2017 14:26    Anti-infectives: Anti-infectives    Start     Dose/Rate Route Frequency Ordered Stop   03/09/17 2200  vancomycin (VANCOCIN) IVPB 750 mg/150 ml premix     750 mg 150 mL/hr over 60 Minutes Intravenous Every 12 hours 03/09/17 1006     03/09/17 1030  vancomycin (VANCOCIN) 1,250 mg in sodium chloride 0.9 % 250 mL IVPB     1,250 mg 166.7 mL/hr over 90 Minutes Intravenous  Once 03/09/17 1006 03/09/17 1159   03/09/17 1030  piperacillin-tazobactam (ZOSYN) IVPB 3.375 g     3.375 g 12.5 mL/hr over 240 Minutes Intravenous Every 8 hours 03/09/17 1006         Assessment/Plan HTN DKA DM type I CKD - creatinine 0.62  Left thigh cellulitis  - area of induration of left thigh - CT of left femur shows no abscess, cellulitis of inner aspect of proximal left thigh possible involvement of sartorius muscle - WBC 9.2 - IV Vanc and Zosyn  Plan: no abscess so conservative measures at this time, continue IV abx, we will continue to follow   LOS: 1 day    Jerre Simon , [facility] 03/10/2017, 8:15 AM Pager: [phone] Consults: [phone] Mon-Fri 7:00 am-4:30 pm Sat-Sun 7:00 am-11:30 am

## 2017-03-10 NOTE — Progress Notes (Signed)
[facility]                                                                              Patient Demographics  Stephanie Huber, is a 51 y.o. female, DOB - [birth year], ZOX:[id number]  Admit date - 03/08/2017   Admitting Physician Stephanie Huber Stephanie Luo, MD  Outpatient Primary MD for the patient is Stephanie Contras, MD  Outpatient specialists:   LOS - 1  days   Medical records reviewed and are as summarized below:    Chief Complaint  Patient presents with  . Weakness  . Aphasia       Brief summary   Patient is a 51 year old female with CKD stage III, hypertension, diabetes mellitus, insulin-dependent presented with nausea, vomiting, generalized weakness, slurred speech, altered mental status on the day of admission. She was found in profound DKA, 26, bicarbonate less than 7, creatinine 1.9.  Patient was admitted to stepdown unit on insulin drip and DKA protocol.   Assessment & Plan    Principal Problem:   DKA (diabetic ketoacidoses) (HCC) with uncontrolled diabetes mellitus type 1, insulin-dependent,  - Bicarbonate of less than 7, gap 26 at the time of admission. Patient was confused and unable to provide much history. Now alert awake and oriented, states that she has "boil" on the left thigh for the last 2-3 days, tender which may be the precipitating cause for the DKA.  - DKA resolved, off the IV insulin drip, transitioned to subcutaneous insulin.  - Hemoglobin A1c pending - CBGs still uncontrolled, increased Lantus to 25 units, NovoLog to 5 units 3 times a day with meals, continue sliding scale insulin - Diabetic coordinator consult for education, nutrition consult for diet education today   Active Problems: Abscess of the left thigh - hard indurated area on the left thigh, tender, likely precipitant cause for the DKA - CT of the left femur, showed cellulitis, surgery consulted - NPO after midnight for I&D - Patient was placed on IV vancomycin and Zosyn.  Discussed with Dr. Ilsa Huber, infectious disease, recommended to narrow down to IV Ancef    Metabolic acidosis - Likely due to profound DKA and lactic acidosis and left thigh abscess -Resolved    Acute encephalopathy -Resolved, Likely due to #1, dehydration, infection - UA negative for UTI, CT head negative for any acute intracranial abnormalities    CKD (chronic kidney disease), stage III - Currently creatinine at baseline - Continue gentle hydration    Essential hypertension - BP currently borderline, continue gentle hydration  Hypokalemia - Replaced  Code Status: Full code DVT Prophylaxis:  Lovenox  Family Communication: Discussed in detail with the patient, all imaging results, lab results explained to the patient. Discussed with patient's husband in detail yesterday.   Disposition Plan: Transfer to floor today  Time Spent in minutes  35 minutes  Procedures:  None  Consultants:   Gen. surgery  Antimicrobials:   IV vancomycin 6/24> 6/25  IV Zosyn 6/24> 6/25  IV Ancef 6/25>   Medications  Scheduled Meds: . enoxaparin (LOVENOX) injection  40 mg Subcutaneous Q24H  . insulin aspart  0-5 Units Subcutaneous QHS  . insulin aspart  0-9 Units Subcutaneous TID WC  . insulin aspart  5 Units Subcutaneous TID WC  . insulin glargine  25 Units Subcutaneous Daily  . potassium chloride  40 mEq Oral Q4H   Continuous Infusions: . sodium chloride 75 mL/hr (03/10/17 0745)   PRN Meds:.   Antibiotics   Anti-infectives    Start     Dose/Rate Route Frequency Ordered Stop   03/09/17 2200  vancomycin (VANCOCIN) IVPB 750 mg/150 ml premix  Status:  Discontinued     750 mg 150 mL/hr over 60 Minutes Intravenous Every 12 hours 03/09/17 1006 03/10/17 1004   03/09/17 1030  vancomycin (VANCOCIN) 1,250 mg in sodium chloride 0.9 % 250 mL IVPB     1,250 mg 166.7 mL/hr over 90 Minutes Intravenous  Once 03/09/17 1006 03/09/17 1159   03/09/17 1030  piperacillin-tazobactam (ZOSYN) IVPB  3.375 g  Status:  Discontinued     3.375 g 12.5 mL/hr over 240 Minutes Intravenous Every 8 hours 03/09/17 1006 03/10/17 1004        Subjective:   Stephanie Huber was seen and examined today. Feeling better today, still having pain in the left thigh indurated area. No fevers or chills.  Patient denies dizziness, chest pain, shortness of breath, abdominal pain, N/V/D/C, new weakness, numbess, tingling. No acute events overnight.    Objective:   Vitals:   03/09/17 2000 03/09/17 2317 03/10/17 0310 03/10/17 0804  BP: 123/70 129/76 (!) 96/59 126/75  Pulse: (!) 109 (!) 103 (!) 112   Resp: 19 17 (!) 22   Temp: 99.7 F (37.6 C) 98.7 F (37.1 C) 98.5 F (36.9 C) 97.7 F (36.5 C)  TempSrc: Oral Oral Oral Oral  SpO2: 99% 100% 97%   Weight:      Height:        Intake/Output Summary (Last 24 hours) at 03/10/17 1026 Last data filed at 03/10/17 0854  Gross per 24 hour  Intake              550 ml  Output             1050 ml  Net             -500 ml     Wt Readings from Last 3 Encounters:  03/08/17 55.4 kg (122 lb 1 oz)  11/09/15 82.8 kg (182 lb 8.7 oz)  02/20/15 82 kg (180 lb 12.4 oz)     Exam  General: Alert and oriented x 3, NAD  Eyes: PERRLA, EOMI  HEENT:  normocephalic/atraumatic  Cardiovascular: S1-S2 clear, RRR  Respiratory: CTA B  Gastrointestinal: Soft, NT, ND, NBS  Ext: no pedal edema bilaterally  Neuro: no focal neurological deficits  Musculoskeletal: No cyanosis or clubbing   Skin: Left posterior thigh, 5cm indurated tender area  Psych: alert and oriented, normal affect   Data Reviewed:  I have personally reviewed following labs and imaging studies  Micro Results Recent Results (from the past 240 hour(s))  Urine culture     Status: Abnormal   Collection Time: 03/08/17  1:27 PM  Result Value Ref Range Status   Specimen Description URINE, RANDOM  Final   Special Requests NONE  Final   Culture MULTIPLE SPECIES PRESENT, SUGGEST RECOLLECTION (A)   Final   Report Status 03/09/2017 FINAL  Final  MRSA PCR Screening     Status: None   Collection Time: 03/08/17  6:23 PM  Result Value Ref Range Status   MRSA by PCR NEGATIVE NEGATIVE Final    Comment:  The GeneXpert MRSA Assay (FDA approved for NASAL specimens only), is one component of a comprehensive MRSA colonization surveillance program. It is not intended to diagnose MRSA infection nor to guide or monitor treatment for MRSA infections.   Culture, blood (routine x 2)     Status: None (Preliminary result)   Collection Time: 03/08/17  6:30 PM  Result Value Ref Range Status   Specimen Description BLOOD LEFT HAND  Final   Special Requests   Final    BOTTLES DRAWN AEROBIC AND ANAEROBIC Blood Culture adequate volume   Culture NO GROWTH < 24 HOURS  Final   Report Status PENDING  Incomplete  Culture, blood (routine x 2)     Status: None (Preliminary result)   Collection Time: 03/08/17  6:36 PM  Result Value Ref Range Status   Specimen Description BLOOD LEFT ARM  Final   Special Requests   Final    BOTTLES DRAWN AEROBIC AND ANAEROBIC Blood Culture adequate volume   Culture NO GROWTH < 24 HOURS  Final   Report Status PENDING  Incomplete    Radiology Reports Ct Head Wo Contrast  Result Date: 03/08/2017 CLINICAL DATA:  51 year old female with a history weakness and slurred speech EXAM: CT HEAD WITHOUT CONTRAST TECHNIQUE: Contiguous axial images were obtained from the base of the skull through the vertex without intravenous contrast. COMPARISON:  None. FINDINGS: Brain: No acute intracranial hemorrhage. No midline shift or mass effect. Gray-white differentiation maintained. Unremarkable appearance of the ventricular system. Vascular: Unremarkable. Skull: No acute fracture.  No aggressive bone lesion identified. Sinuses/Orbits: Unremarkable appearance of the orbits. Mastoid air cells clear. No middle ear effusion. No significant sinus disease. Other: None IMPRESSION: No CT evidence  of acute intracranial abnormality. Aspects is 10 These results were called by telephone at the time of interpretation on 03/08/2017 at 1:28 pm to Dr. Donnetta Hutching , who verbally acknowledged these results. Electronically Signed   By: Gilmer Mor D.O.   On: 03/08/2017 13:29   Ct Femur Left W Contrast  Result Date: 03/09/2017 CLINICAL DATA:  Abscess.  Pain. EXAM: CT OF THE LOWER RIGHT EXTREMITY WITH CONTRAST TECHNIQUE: Multidetector CT imaging of the lower right extremity was performed according to the standard protocol following intravenous contrast administration. COMPARISON:  None. CONTRAST:  100 cc Isovue-300 FINDINGS: Bones/Joint/Cartilage Normal. Soft tissues There is abnormal soft tissue stranding in the subcutaneous fat at the medial aspect of the proximal left thigh superficial to the left sartorius muscle. The soft tissue stranding extends around the sartorius muscle but there is no abscess. No other abnormality. IMPRESSION: Findings consistent with cellulitis of the inner aspect of the proximal left thigh. Possible involvement of the adjacent sartorius muscle consistent with focal myositis. Electronically Signed   By: Francene Boyers M.D.   On: 03/09/2017 14:27   Dg Chest Portable 1 View  Result Date: 03/08/2017 CLINICAL DATA:  51 year old female with a history of dyspnea EXAM: PORTABLE CHEST 1 VIEW COMPARISON:  11/09/2015, 09/21/2006 FINDINGS: Cardiomediastinal silhouette unchanged in size and contour. No pneumothorax or pleural effusion.  No confluent airspace disease. No evidence of central vascular congestion or edema. No displaced fracture IMPRESSION: No radiographic evidence of acute cardiopulmonary disease Electronically Signed   By: Gilmer Mor D.O.   On: 03/08/2017 14:26    Lab Data:  CBC:  Recent Labs Lab 03/08/17 1309 03/08/17 1835 03/09/17 0945 03/10/17 0502  WBC 22.1* 18.3* 11.1* 9.2  HGB 14.0 13.8 12.8 10.8*  HCT 42.4 43.0 38.3 33.2*  MCV  79.8 80.4 76.0* 76.1*  PLT  437* 312 288 294   Basic Metabolic Panel:  Recent Labs Lab 03/08/17 2231 03/09/17 0237 03/09/17 0555 03/09/17 0945 03/10/17 0502  NA 131* 135 134* 133* 134*  K 3.2* 3.3* 3.2* 3.4* 2.9*  CL 111 113* 111 110 110  CO2 13* 15* 15* 13* 20*  GLUCOSE 165* 136* 144* 198* 207*  BUN 6 5* 5* <5* <5*  CREATININE 0.76 0.61 0.62 0.62 0.58  CALCIUM 8.1* 8.6* 8.7* 8.8* 8.5*   GFR: Estimated Creatinine Clearance: 73.6 mL/min (by C-G formula based on SCr of 0.58 mg/dL). Liver Function Tests:  Recent Labs Lab 03/08/17 1309  AST 16  ALT 12*  ALKPHOS 303*  BILITOT 1.4*  PROT 9.0*  ALBUMIN 3.7   No results for input(s): LIPASE, AMYLASE in the last 168 hours. No results for input(s): AMMONIA in the last 168 hours. Coagulation Profile: No results for input(s): INR, PROTIME in the last 168 hours. Cardiac Enzymes: No results for input(s): CKTOTAL, CKMB, CKMBINDEX, TROPONINI in the last 168 hours. BNP (last 3 results) No results for input(s): PROBNP in the last 8760 hours. HbA1C: No results for input(s): HGBA1C in the last 72 hours. CBG:  Recent Labs Lab 03/09/17 1131 03/09/17 1613 03/09/17 2124 03/09/17 2359 03/10/17 0803  GLUCAP 146* 174* 100* 161* 165*   Lipid Profile: No results for input(s): CHOL, HDL, LDLCALC, TRIG, CHOLHDL, LDLDIRECT in the last 72 hours. Thyroid Function Tests: No results for input(s): TSH, T4TOTAL, FREET4, T3FREE, THYROIDAB in the last 72 hours. Anemia Panel: No results for input(s): VITAMINB12, FOLATE, FERRITIN, TIBC, IRON, RETICCTPCT in the last 72 hours. Urine analysis:    Component Value Date/Time   COLORURINE STRAW (A) 03/08/2017 1327   APPEARANCEUR HAZY (A) 03/08/2017 1327   LABSPEC 1.017 03/08/2017 1327   PHURINE 5.0 03/08/2017 1327   GLUCOSEU >=500 (A) 03/08/2017 1327   HGBUR SMALL (A) 03/08/2017 1327   BILIRUBINUR NEGATIVE 03/08/2017 1327   KETONESUR 80 (A) 03/08/2017 1327   PROTEINUR 100 (A) 03/08/2017 1327   UROBILINOGEN 0.2  02/20/2015 1737   NITRITE NEGATIVE 03/08/2017 1327   LEUKOCYTESUR NEGATIVE 03/08/2017 1327     Norwood Quezada M.D. [facility] 03/10/2017, 10:26 AM  Pager: [phone] Between 7am to 7pm - call Pager - [phone]  After 7pm go to [email or web address] - password [email or web address]  Call night coverage person covering after 7pm

## 2017-03-11 ENCOUNTER — Encounter (HOSPITAL_COMMUNITY): Payer: Self-pay | Admitting: Anesthesiology

## 2017-03-11 ENCOUNTER — Inpatient Hospital Stay (HOSPITAL_COMMUNITY): Payer: Medicare Other | Admitting: Anesthesiology

## 2017-03-11 ENCOUNTER — Encounter (HOSPITAL_COMMUNITY)
Admission: EM | Disposition: A | Discharge status: Home / Self Care | Payer: Self-pay | Source: Home / Self Care | Attending: Internal Medicine

## 2017-03-11 DIAGNOSIS — R739 Hyperglycemia, unspecified: Secondary | ICD-10-CM

## 2017-03-11 DIAGNOSIS — E440 Moderate protein-calorie malnutrition: Secondary | ICD-10-CM | POA: Insufficient documentation

## 2017-03-11 HISTORY — PX: INCISION AND DRAINAGE ABSCESS: SHX5864

## 2017-03-11 LAB — CBC
HCT: 32.7 % — ABNORMAL LOW (ref 36.0–46.0)
Hemoglobin: 10.7 g/dL — ABNORMAL LOW (ref 12.0–15.0)
MCH: 24.8 pg — AB (ref 26.0–34.0)
MCHC: 32.7 g/dL (ref 30.0–36.0)
MCV: 75.9 fL — AB (ref 78.0–100.0)
PLATELETS: 313 10*3/uL (ref 150–400)
RBC: 4.31 MIL/uL (ref 3.87–5.11)
RDW: 14.3 % (ref 11.5–15.5)
WBC: 7.6 10*3/uL (ref 4.0–10.5)

## 2017-03-11 LAB — BASIC METABOLIC PANEL
Anion gap: 9 (ref 5–15)
BUN: <5 mg/dL — ABNORMAL LOW (ref 6–20)
CHLORIDE: 108 mmol/L (ref 101–111)
CO2: 22 mmol/L (ref 22–32)
CREATININE: 0.62 mg/dL (ref 0.44–1.00)
Calcium: 8.3 mg/dL — ABNORMAL LOW (ref 8.9–10.3)
GFR calc Af Amer: >60 mL/min (ref >60)
GFR calc non Af Amer: >60 mL/min (ref >60)
Glucose, Bld: 145 mg/dL — ABNORMAL HIGH (ref 65–99)
Potassium: 2.5 mmol/L — CL (ref 3.5–5.1)
SODIUM: 139 mmol/L (ref 135–145)

## 2017-03-11 LAB — GLUCOSE, CAPILLARY
GLUCOSE-CAPILLARY: 172 mg/dL — AB (ref 65–99)
GLUCOSE-CAPILLARY: 135 mg/dL — AB (ref 65–99)
GLUCOSE-CAPILLARY: 210 mg/dL — AB (ref 65–99)
GLUCOSE-CAPILLARY: 178 mg/dL — AB (ref 65–99)
Glucose-Capillary: 127 mg/dL — ABNORMAL HIGH (ref 65–99)
Glucose-Capillary: 66 mg/dL (ref 65–99)
Glucose-Capillary: 111 mg/dL — ABNORMAL HIGH (ref 65–99)

## 2017-03-11 LAB — POTASSIUM: Potassium: 3.1 mmol/L — ABNORMAL LOW (ref 3.5–5.1)

## 2017-03-11 LAB — MAGNESIUM: Magnesium: 1.5 mg/dL — ABNORMAL LOW (ref 1.7–2.4)

## 2017-03-11 LAB — SURGICAL PCR SCREEN
MRSA, PCR: NEGATIVE
Staphylococcus aureus: NEGATIVE

## 2017-03-11 SURGERY — INCISION AND DRAINAGE, ABSCESS
Anesthesia: General | Site: Thigh | Laterality: Left

## 2017-03-11 MED ORDER — POTASSIUM CHLORIDE 10 MEQ/100ML IV SOLN
10.0000 meq | INTRAVENOUS | Status: AC
  Administered 2017-03-11 (×2): 10 meq via INTRAVENOUS
  Filled 2017-03-11: qty 100

## 2017-03-11 MED ORDER — 0.9 % SODIUM CHLORIDE (POUR BTL) OPTIME
TOPICAL | Status: DC | PRN
  Administered 2017-03-11: 1000 mL

## 2017-03-11 MED ORDER — DEXTROSE 50 % IV SOLN
50.0000 mL | Freq: Once | INTRAVENOUS | Status: AC
  Administered 2017-03-11: 50 mL via INTRAVENOUS

## 2017-03-11 MED ORDER — FENTANYL CITRATE (PF) 100 MCG/2ML IJ SOLN
INTRAMUSCULAR | Status: AC
  Filled 2017-03-11: qty 2

## 2017-03-11 MED ORDER — ROCURONIUM BROMIDE 10 MG/ML (PF) SYRINGE
PREFILLED_SYRINGE | INTRAVENOUS | Status: AC
  Filled 2017-03-11: qty 15

## 2017-03-11 MED ORDER — DEXTROSE 50 % IV SOLN
INTRAVENOUS | Status: AC
  Filled 2017-03-11: qty 50

## 2017-03-11 MED ORDER — SODIUM CHLORIDE 0.9 % IJ SOLN
INTRAMUSCULAR | Status: DC | PRN
  Administered 2017-03-11: 6 mL

## 2017-03-11 MED ORDER — PROPOFOL 10 MG/ML IV BOLUS
INTRAVENOUS | Status: DC | PRN
Start: 2017-03-11 — End: 2017-03-11
  Administered 2017-03-11: 170 mg via INTRAVENOUS

## 2017-03-11 MED ORDER — PROPOFOL 10 MG/ML IV BOLUS
INTRAVENOUS | Status: AC
  Filled 2017-03-11: qty 20

## 2017-03-11 MED ORDER — OXYCODONE HCL 5 MG PO TABS: 5.0000 mg | ORAL_TABLET | Freq: Once | ORAL | Status: DC | PRN

## 2017-03-11 MED ORDER — POTASSIUM CHLORIDE CRYS ER 20 MEQ PO TBCR
40.0000 meq | EXTENDED_RELEASE_TABLET | Freq: Two times a day (BID) | ORAL | Status: DC
  Administered 2017-03-11 (×2): 40 meq via ORAL
  Filled 2017-03-11 (×2): qty 2

## 2017-03-11 MED ORDER — BUPIVACAINE-EPINEPHRINE (PF) 0.25% -1:200000 IJ SOLN
INTRAMUSCULAR | Status: AC
  Filled 2017-03-11: qty 30

## 2017-03-11 MED ORDER — ONDANSETRON HCL 4 MG/2ML IJ SOLN
INTRAMUSCULAR | Status: AC
  Filled 2017-03-11: qty 2

## 2017-03-11 MED ORDER — POTASSIUM CHLORIDE 10 MEQ/100ML IV SOLN
10.0000 meq | INTRAVENOUS | Status: AC
  Administered 2017-03-11 (×2): 10 meq via INTRAVENOUS
  Filled 2017-03-11 (×2): qty 100

## 2017-03-11 MED ORDER — FENTANYL CITRATE (PF) 100 MCG/2ML IJ SOLN
INTRAMUSCULAR | Status: DC | PRN
  Administered 2017-03-11: 50 ug via INTRAVENOUS

## 2017-03-11 MED ORDER — LIDOCAINE 2% (20 MG/ML) 5 ML SYRINGE
INTRAMUSCULAR | Status: AC
  Filled 2017-03-11: qty 15

## 2017-03-11 MED ORDER — FENTANYL CITRATE (PF) 250 MCG/5ML IJ SOLN
INTRAMUSCULAR | Status: AC
  Filled 2017-03-11: qty 5

## 2017-03-11 MED ORDER — ONDANSETRON HCL 4 MG/2ML IJ SOLN: 4.0000 mg | Freq: Once | INTRAMUSCULAR | Status: DC | PRN

## 2017-03-11 MED ORDER — LACTATED RINGERS IV SOLN: INTRAVENOUS | Status: DC

## 2017-03-11 MED ORDER — LIDOCAINE 2% (20 MG/ML) 5 ML SYRINGE
INTRAMUSCULAR | Status: DC | PRN
Start: 2017-03-11 — End: 2017-03-11
  Administered 2017-03-11: 40 mg via INTRAVENOUS

## 2017-03-11 MED ORDER — MIDAZOLAM HCL 5 MG/5ML IJ SOLN
INTRAMUSCULAR | Status: DC | PRN
  Administered 2017-03-11: 2 mg via INTRAVENOUS

## 2017-03-11 MED ORDER — FENTANYL CITRATE (PF) 100 MCG/2ML IJ SOLN
25.0000 ug | INTRAMUSCULAR | Status: DC | PRN
  Administered 2017-03-11: 50 ug via INTRAVENOUS

## 2017-03-11 MED ORDER — MAGNESIUM SULFATE 50 % IJ SOLN
3.0000 g | Freq: Once | INTRAMUSCULAR | Status: AC
  Administered 2017-03-11: 3 g via INTRAVENOUS
  Filled 2017-03-11: qty 6

## 2017-03-11 MED ORDER — MIDAZOLAM HCL 2 MG/2ML IJ SOLN
INTRAMUSCULAR | Status: AC
  Filled 2017-03-11: qty 2

## 2017-03-11 MED ORDER — SUCCINYLCHOLINE CHLORIDE 200 MG/10ML IV SOSY
PREFILLED_SYRINGE | INTRAVENOUS | Status: AC
  Filled 2017-03-11: qty 10

## 2017-03-11 MED ORDER — OXYCODONE HCL 5 MG/5ML PO SOLN: 5.0000 mg | Freq: Once | ORAL | Status: DC | PRN

## 2017-03-11 MED ORDER — POTASSIUM CHLORIDE CRYS ER 20 MEQ PO TBCR: 40.0000 meq | EXTENDED_RELEASE_TABLET | Freq: Every day | ORAL | Status: DC

## 2017-03-11 MED ORDER — ONDANSETRON HCL 4 MG/2ML IJ SOLN
INTRAMUSCULAR | Status: DC | PRN
  Administered 2017-03-11: 4 mg via INTRAVENOUS

## 2017-03-11 SURGICAL SUPPLY — 27 items
BNDG GAUZE ELAST 4 BULKY (GAUZE/BANDAGES/DRESSINGS) IMPLANT
CANISTER SUCT 3000ML PPV (MISCELLANEOUS) ×3 IMPLANT
COVER SURGICAL LIGHT HANDLE (MISCELLANEOUS) ×3 IMPLANT
DRAPE LAPAROSCOPIC ABDOMINAL (DRAPES) IMPLANT
DRAPE LAPAROTOMY 100X72 PEDS (DRAPES) IMPLANT
DRAPE UTILITY XL STRL (DRAPES) ×6 IMPLANT
DRSG PAD ABDOMINAL 8X10 ST (GAUZE/BANDAGES/DRESSINGS) IMPLANT
ELECT CAUTERY BLADE 6.4 (BLADE) ×3 IMPLANT
ELECT REM PT RETURN 9FT ADLT (ELECTROSURGICAL) ×3
ELECTRODE REM PT RTRN 9FT ADLT (ELECTROSURGICAL) ×1 IMPLANT
GAUZE IODOFORM PACK 1/2 7832 (GAUZE/BANDAGES/DRESSINGS) ×2 IMPLANT
GAUZE SPONGE 4X4 12PLY STRL (GAUZE/BANDAGES/DRESSINGS) IMPLANT
GLOVE BIO SURGEON STRL SZ7 (GLOVE) ×3 IMPLANT
GLOVE BIOGEL PI IND STRL 7.5 (GLOVE) ×1 IMPLANT
GLOVE BIOGEL PI INDICATOR 7.5 (GLOVE) ×2
GOWN STRL REUS W/ TWL LRG LVL3 (GOWN DISPOSABLE) ×2 IMPLANT
GOWN STRL REUS W/TWL LRG LVL3 (GOWN DISPOSABLE) ×6
KIT BASIN OR (CUSTOM PROCEDURE TRAY) ×3 IMPLANT
KIT ROOM TURNOVER OR (KITS) ×3 IMPLANT
NS IRRIG 1000ML POUR BTL (IV SOLUTION) ×3 IMPLANT
PACK GENERAL/GYN (CUSTOM PROCEDURE TRAY) ×3 IMPLANT
PAD ABD 8X10 STRL (GAUZE/BANDAGES/DRESSINGS) ×2 IMPLANT
PAD ARMBOARD 7.5X6 YLW CONV (MISCELLANEOUS) ×3 IMPLANT
SWAB COLLECTION DEVICE MRSA (MISCELLANEOUS) IMPLANT
SWAB CULTURE ESWAB REG 1ML (MISCELLANEOUS) IMPLANT
TOWEL OR 17X24 6PK STRL BLUE (TOWEL DISPOSABLE) ×3 IMPLANT
TOWEL OR 17X26 10 PK STRL BLUE (TOWEL DISPOSABLE) ×3 IMPLANT

## 2017-03-11 NOTE — Progress Notes (Signed)
Before patient went to ON her CBG was 66. Patient received D50 - 50ml IV per protocol. MD was informed and also, RN in short stay was informed. They will recheck CBG in short stay.

## 2017-03-11 NOTE — Progress Notes (Signed)
Patient back from OR. Alert and oriented x 4; no complaints of pain or other discomfort; VS stable. Dressing on left thigh area dry, intact, clean. Will continue to monitor.

## 2017-03-11 NOTE — Progress Notes (Signed)
CHG bath given 

## 2017-03-11 NOTE — Progress Notes (Signed)
Patient going to OR. MD from OR informed about patient's K level this morning and what treatment patient received. He is OK with patient going to surgery now.  Report given to the nurse in short stay.

## 2017-03-11 NOTE — Progress Notes (Signed)
[facility]                                                                              Patient Demographics  Stephanie Huber, is a 51 y.o. female, DOB - Jul 13, 1966, WUJ:[id number]  Admit date - 03/08/2017   Admitting Physician Stryker Veasey [name], MD  Outpatient Primary MD for the patient is Fleet Contras, MD  Outpatient specialists:   LOS - 2  days   Medical records reviewed and are as summarized below:    Chief Complaint  Patient presents with  . Weakness  . Aphasia       Brief summary   Patient is a 51 year old female with CKD stage III, hypertension, diabetes mellitus, insulin-dependent presented with nausea, vomiting, generalized weakness, slurred speech, altered mental status on the day of admission. She was found in profound DKA, 26, bicarbonate less than 7, creatinine 1.9.   Patient was admitted to stepdown unit on insulin drip and DKA protocol. Next morning patient was alert awake and oriented, informed that she had "boil" hard indurated tender area, likely could have precipitated DKA    Assessment & Plan    Principal Problem:   DKA (diabetic ketoacidoses) (HCC) with uncontrolled diabetes mellitus type 1, insulin-dependent,  - Bicarbonate of less than 7, gap 26 at the time of admission. Patient was confused and unable to provide much history. Now alert awake and oriented, states that she has "boil" on the left thigh for the last 2-3 days, tender which may be the precipitating cause for the DKA.  - DKA resolved, off the IV insulin drip, transitioned to subcutaneous insulin.  - Hemoglobin A1c >15.5 -  this morning hypoglycemia secondary to NPO status, hence will not change insulin regimen however she needs better glycemic control outpatient. - continue Lantus to 25 units, NovoLog to 5 units 3 times a day with meals, continue sliding scale insulin - Diabetic coordinator following, nutrition consult for diet education   Active Problems: Abscess of  the left thigh - hard indurated area on the left thigh, tender, likely precipitant cause for the DKA - CT of the left femur showed cellulitis,  however area tender and fluctuant, surgery consulted - Patient was placed on IV vancomycin and Zosyn. Discussed with Dr. Ilsa Iha, infectious disease, recommended to narrow down to IV Ancef - npo for OR today     Metabolic acidosis - Likely due to profound DKA and lactic acidosis and left thigh abscess - Resolved    Acute encephalopathy -Resolved, Likely due to #1, dehydration, infection - UA negative for UTI, CT head negative for any acute intracranial abnormalities    CKD (chronic kidney disease), stage III - Currently creatinine at baseline - Continue gentle hydration    Essential hypertension -  BP stable   Hypokalemia -  potassium 2.5, replaced aggressively IV and oral   Hypomagnesemia - Magnesium 1.5, replaced  Code Status: Full code DVT Prophylaxis:  Lovenox  Family Communication: Discussed in detail with the patient, all imaging results, lab results explained to the patient. Discussed with patient's husband in detail yesterday.   Disposition Plan:   Time Spent in minutes  35  minutes  Procedures:    Consultants:   Gen. surgery  Antimicrobials:   IV vancomycin 6/24> 6/25  IV Zosyn 6/24> 6/25  IV Ancef 6/25>   Medications  Scheduled Meds: . dextrose      . [MAR Hold] enoxaparin (LOVENOX) injection  40 mg Subcutaneous Q24H  . [MAR Hold] feeding supplement (GLUCERNA SHAKE)  237 mL Oral TID BM  . [MAR Hold] insulin aspart  0-5 Units Subcutaneous QHS  . [MAR Hold] insulin aspart  0-9 Units Subcutaneous TID WC  . [MAR Hold] insulin aspart  5 Units Subcutaneous TID WC  . [MAR Hold] insulin glargine  25 Units Subcutaneous Daily  . [MAR Hold] potassium chloride  40 mEq Oral BID   Continuous Infusions: . sodium chloride 75 mL/hr at 03/11/17 0048  . [MAR Hold]  ceFAZolin (ANCEF) IV 2 g (03/11/17 0544)  . lactated  ringers     PRN Meds:.   Antibiotics   Anti-infectives    Start     Dose/Rate Route Frequency Ordered Stop   03/10/17 1400  [MAR Hold]  ceFAZolin (ANCEF) IVPB 2g/100 mL premix     (MAR Hold since 03/11/17 1038)   2 g 200 mL/hr over 30 Minutes Intravenous Every 8 hours 03/10/17 1038     03/09/17 2200  vancomycin (VANCOCIN) IVPB 750 mg/150 ml premix  Status:  Discontinued     750 mg 150 mL/hr over 60 Minutes Intravenous Every 12 hours 03/09/17 1006 03/10/17 1004   03/09/17 1030  vancomycin (VANCOCIN) 1,250 mg in sodium chloride 0.9 % 250 mL IVPB     1,250 mg 166.7 mL/hr over 90 Minutes Intravenous  Once 03/09/17 1006 03/09/17 1159   03/09/17 1030  piperacillin-tazobactam (ZOSYN) IVPB 3.375 g  Status:  Discontinued     3.375 g 12.5 mL/hr over 240 Minutes Intravenous Every 8 hours 03/09/17 1006 03/10/17 1004        Subjective:   Aleda GranaKeeshia Ricchio was seen and examined today. Awaiting surgery today. No other complaints, no fevers or chills.    Patient denies dizziness, chest pain, shortness of breath, abdominal pain, N/V/D/C, new weakness, numbess, tingling. No acute events overnight.    Objective:   Vitals:   03/11/17 0515 03/11/17 1021 03/11/17 1200 03/11/17 1215  BP: 125/77 (!) 148/88 (!) 147/98 (!) 153/99  Pulse: 99 (!) 120 100 (!) 103  Resp: 17 18 14 18   Temp: 98.6 F (37 C) 97.7 F (36.5 C) 97.7 F (36.5 C)   TempSrc: Oral Oral    SpO2: 99% 100% 100% 100%  Weight:      Height:        Intake/Output Summary (Last 24 hours) at 03/11/17 1236 Last data filed at 03/11/17 1210  Gross per 24 hour  Intake             1475 ml  Output              110 ml  Net             1365 ml     Wt Readings from Last 3 Encounters:  03/10/17 58.6 kg (129 lb 1.6 oz)  11/09/15 82.8 kg (182 lb 8.7 oz)  02/20/15 82 kg (180 lb 12.4 oz)     Exam  General: Alert and oriented x 3, NAD  Eyes: PERRLA, EOMI  HEENT  normocephalic, atraumatic   Cardiovascular:  S1 and S2 clear Regular  rhythm  RespiratoryClear to auscultation bilaterally  Gastrointestinal : Soft,  nontender nondistended normal bowel  sounds  ext: no pedal edema bilaterally  Neuro: no FND  Musculoskeletal: No cyanosis or clubbing   Skin: Left posterior thigh about 5-6cm hard indurated area   Psych:  normal affect alert and oriented 3   Data Reviewed:  I have personally reviewed following labs and imaging studies  Micro Results Recent Results (from the past 240 hour(s))  Urine culture     Status: Abnormal   Collection Time: 03/08/17  1:27 PM  Result Value Ref Range Status   Specimen Description URINE, RANDOM  Final   Special Requests NONE  Final   Culture MULTIPLE SPECIES PRESENT, SUGGEST RECOLLECTION (A)  Final   Report Status 03/09/2017 FINAL  Final  MRSA PCR Screening     Status: None   Collection Time: 03/08/17  6:23 PM  Result Value Ref Range Status   MRSA by PCR NEGATIVE NEGATIVE Final    Comment:        The GeneXpert MRSA Assay (FDA approved for NASAL specimens only), is one component of a comprehensive MRSA colonization surveillance program. It is not intended to diagnose MRSA infection nor to guide or monitor treatment for MRSA infections.   Culture, blood (routine x 2)     Status: None (Preliminary result)   Collection Time: 03/08/17  6:30 PM  Result Value Ref Range Status   Specimen Description BLOOD LEFT HAND  Final   Special Requests   Final    BOTTLES DRAWN AEROBIC AND ANAEROBIC Blood Culture adequate volume   Culture NO GROWTH 2 DAYS  Final   Report Status PENDING  Incomplete  Culture, blood (routine x 2)     Status: None (Preliminary result)   Collection Time: 03/08/17  6:36 PM  Result Value Ref Range Status   Specimen Description BLOOD LEFT ARM  Final   Special Requests   Final    BOTTLES DRAWN AEROBIC AND ANAEROBIC Blood Culture adequate volume   Culture NO GROWTH 2 DAYS  Final   Report Status PENDING  Incomplete  Surgical pcr screen     Status: None    Collection Time: 03/11/17  2:21 AM  Result Value Ref Range Status   MRSA, PCR NEGATIVE NEGATIVE Final   Staphylococcus aureus NEGATIVE NEGATIVE Final    Comment:        The Xpert SA Assay (FDA approved for NASAL specimens in patients over 98 years of age), is one component of a comprehensive surveillance program.  Test performance has been validated by [facility] for patients greater than or equal to 77 year old. It is not intended to diagnose infection nor to guide or monitor treatment.     Radiology Reports Ct Head Wo Contrast  Result Date: 03/08/2017 CLINICAL DATA:  51 year old female with a history weakness and slurred speech EXAM: CT HEAD WITHOUT CONTRAST TECHNIQUE: Contiguous axial images were obtained from the base of the skull through the vertex without intravenous contrast. COMPARISON:  None. FINDINGS: Brain: No acute intracranial hemorrhage. No midline shift or mass effect. Gray-white differentiation maintained. Unremarkable appearance of the ventricular system. Vascular: Unremarkable. Skull: No acute fracture.  No aggressive bone lesion identified. Sinuses/Orbits: Unremarkable appearance of the orbits. Mastoid air cells clear. No middle ear effusion. No significant sinus disease. Other: None IMPRESSION: No CT evidence of acute intracranial abnormality. Aspects is 10 These results were called by telephone at the time of interpretation on 03/08/2017 at 1:28 pm to Dr. Donnetta Hutching , who verbally acknowledged these results. Electronically Signed   By: Gilmer Mor  D.O.   On: 03/08/2017 13:29   Ct Femur Left W Contrast  Result Date: 03/09/2017 CLINICAL DATA:  Abscess.  Pain. EXAM: CT OF THE LOWER RIGHT EXTREMITY WITH CONTRAST TECHNIQUE: Multidetector CT imaging of the lower right extremity was performed according to the standard protocol following intravenous contrast administration. COMPARISON:  None. CONTRAST:  100 cc Isovue-300 FINDINGS: Bones/Joint/Cartilage Normal. Soft tissues  There is abnormal soft tissue stranding in the subcutaneous fat at the medial aspect of the proximal left thigh superficial to the left sartorius muscle. The soft tissue stranding extends around the sartorius muscle but there is no abscess. No other abnormality. IMPRESSION: Findings consistent with cellulitis of the inner aspect of the proximal left thigh. Possible involvement of the adjacent sartorius muscle consistent with focal myositis. Electronically Signed   By: Francene Boyers M.D.   On: 03/09/2017 14:27   Dg Chest Portable 1 View  Result Date: 03/08/2017 CLINICAL DATA:  51 year old female with a history of dyspnea EXAM: PORTABLE CHEST 1 VIEW COMPARISON:  11/09/2015, 09/21/2006 FINDINGS: Cardiomediastinal silhouette unchanged in size and contour. No pneumothorax or pleural effusion.  No confluent airspace disease. No evidence of central vascular congestion or edema. No displaced fracture IMPRESSION: No radiographic evidence of acute cardiopulmonary disease Electronically Signed   By: Gilmer Mor D.O.   On: 03/08/2017 14:26    Lab Data:  CBC:  Recent Labs Lab 03/08/17 1309 03/08/17 1835 03/09/17 0945 03/10/17 0502 03/11/17 0637  WBC 22.1* 18.3* 11.1* 9.2 7.6  HGB 14.0 13.8 12.8 10.8* 10.7*  HCT 42.4 43.0 38.3 33.2* 32.7*  MCV 79.8 80.4 76.0* 76.1* 75.9*  PLT 437* 312 288 294 313   Basic Metabolic Panel:  Recent Labs Lab 03/09/17 0237 03/09/17 0555 03/09/17 0945 03/10/17 0502 03/10/17 1245 03/11/17 0637 03/11/17 0932  NA 135 134* 133* 134*  --  139  --   K 3.3* 3.2* 3.4* 2.9* 3.0* 2.5*  --   CL 113* 111 110 110  --  108  --   CO2 15* 15* 13* 20*  --  22  --   GLUCOSE 136* 144* 198* 207*  --  145*  --   BUN 5* 5* <5* <5*  --  <5*  --   CREATININE 0.61 0.62 0.62 0.58  --  0.62  --   CALCIUM 8.6* 8.7* 8.8* 8.5*  --  8.3*  --   MG  --   --   --   --   --   --  1.5*   GFR: Estimated Creatinine Clearance: 77.8 mL/min (by C-G formula based on SCr of 0.62 mg/dL). Liver  Function Tests:  Recent Labs Lab 03/08/17 1309  AST 16  ALT 12*  ALKPHOS 303*  BILITOT 1.4*  PROT 9.0*  ALBUMIN 3.7   No results for input(s): LIPASE, AMYLASE in the last 168 hours. No results for input(s): AMMONIA in the last 168 hours. Coagulation Profile: No results for input(s): INR, PROTIME in the last 168 hours. Cardiac Enzymes: No results for input(s): CKTOTAL, CKMB, CKMBINDEX, TROPONINI in the last 168 hours. BNP (last 3 results) No results for input(s): PROBNP in the last 8760 hours. HbA1C:  Recent Labs  03/08/17 1836  HGBA1C >15.5*   CBG:  Recent Labs Lab 03/10/17 2114 03/11/17 0745 03/11/17 1016 03/11/17 1049 03/11/17 1209  GLUCAP 118* 127* 66 172* 135*   Lipid Profile: No results for input(s): CHOL, HDL, LDLCALC, TRIG, CHOLHDL, LDLDIRECT in the last 72 hours. Thyroid Function Tests: No results for input(s):  TSH, T4TOTAL, FREET4, T3FREE, THYROIDAB in the last 72 hours. Anemia Panel: No results for input(s): VITAMINB12, FOLATE, FERRITIN, TIBC, IRON, RETICCTPCT in the last 72 hours. Urine analysis:    Component Value Date/Time   COLORURINE STRAW (A) 03/08/2017 1327   APPEARANCEUR HAZY (A) 03/08/2017 1327   LABSPEC 1.017 03/08/2017 1327   PHURINE 5.0 03/08/2017 1327   GLUCOSEU >=500 (A) 03/08/2017 1327   HGBUR SMALL (A) 03/08/2017 1327   BILIRUBINUR NEGATIVE 03/08/2017 1327   KETONESUR 80 (A) 03/08/2017 1327   PROTEINUR 100 (A) 03/08/2017 1327   UROBILINOGEN 0.2 02/20/2015 1737   NITRITE NEGATIVE 03/08/2017 1327   LEUKOCYTESUR NEGATIVE 03/08/2017 1327     Kamaljit Hizer M.D. [facility] 03/11/2017, 12:36 PM  Pager: [phone] Between 7am to 7pm - call Pager - [phone]  After 7pm go to [email or web address] - password [email or web address]  Call night coverage person covering after 7pm

## 2017-03-11 NOTE — Anesthesia Preprocedure Evaluation (Signed)
Anesthesia Evaluation  Patient identified by MRN, date of birth, ID band Patient awake    Reviewed: Allergy & Precautions, NPO status , Patient's Chart, lab work & pertinent test results  Airway Mallampati: II  TM Distance: >3 FB Neck ROM: Full    Dental  (+) Teeth Intact, Dental Advisory Given,    Pulmonary Current Smoker,    breath sounds clear to auscultation       Cardiovascular hypertension,  Rhythm:Regular Rate:Normal     Neuro/Psych    GI/Hepatic   Endo/Other  diabetes  Renal/GU      Musculoskeletal   Abdominal   Peds  Hematology   Anesthesia Other Findings   Reproductive/Obstetrics                             Anesthesia Physical Anesthesia Plan  ASA: III  Anesthesia Plan: General   Post-op Pain Management:    Induction: Intravenous  PONV Risk Score and Plan: Ondansetron, Propofol and Midazolam  Airway Management Planned: Oral ETT  Additional Equipment:   Intra-op Plan:   Post-operative Plan: Extubation in OR  Informed Consent:   Dental advisory given  Plan Discussed with: CRNA and Anesthesiologist  Anesthesia Plan Comments:         Anesthesia Quick Evaluation

## 2017-03-11 NOTE — Progress Notes (Signed)
Patient arrived to short stay with intact IV with potassium running. CRNA at bedside stated that IV has now infiltrated. Will monitor.

## 2017-03-11 NOTE — Progress Notes (Signed)
Hypoglycemic Event  CBG: 66  Treatment: D50 IV 50 mL  Symptoms: none  Follow-up CBG: Time:n/a CBG Result:n/a  Possible Reasons for Event: Other: NPO  Comments/MD notified:MD notified    [name]

## 2017-03-11 NOTE — Progress Notes (Signed)
CRITICAL VALUE ALERT  Critical Value:  K=2.5  Date & Time Notied:  03/11/2017 - 0800  Provider Notified: Dr. Isidoro Donningai  Orders Received/Actions taken: - K IV and pills

## 2017-03-11 NOTE — Interval H&P Note (Signed)
History and Physical Interval Note:  03/11/2017 10:47 AM  Stephanie JewettKeeshia D Pedretti  has presented today for surgery, with the diagnosis of LEFT THIGH ABSCESS  The various methods of treatment have been discussed with the patient and family. After consideration of risks, benefits and other options for treatment, the patient has consented to  Procedure(s): INCISION AND DRAINAGE LEFT THIGH ABSCESS (Left) as a surgical intervention .  The patient's history has been reviewed, patient examined, no change in status, stable for surgery.  I have reviewed the patient's chart and labs.  Questions were answered to the patient's satisfaction.     Jermeka Schlotterbeck

## 2017-03-11 NOTE — Transfer of Care (Signed)
Immediate Anesthesia Transfer of Care Note  Patient: Stephanie JewettKeeshia D Walgren  Procedure(s) Performed: Procedure(s): INCISION AND DRAINAGE LEFT THIGH ABSCESS (Left)  Patient Location: PACU  Anesthesia Type:General  Level of Consciousness: awake, alert  and oriented  Airway & Oxygen Therapy: Patient Spontanous Breathing and Patient connected to nasal cannula oxygen  Post-op Assessment: Report given to RN, Post -op Vital signs reviewed and stable and Patient moving all extremities X 4  Post vital signs: Reviewed and stable  Last Vitals:  Vitals:   03/11/17 0515 03/11/17 1021  BP: 125/77 (!) 148/88  Pulse: 99 (!) 120  Resp: 17 18  Temp: 37 C 36.5 C    Last Pain:  Vitals:   03/11/17 1021  TempSrc: Oral  PainSc:       Patients Stated Pain Goal: 2 (03/10/17 0745)  Complications: No apparent anesthesia complications

## 2017-03-11 NOTE — H&P (View-Only) (Signed)
[facility] Progress Note      Subjective:  CC; left thigh pain  Pt states pain in left thigh is slightly better than yesterday. Pt is eating okay. No acute events overnight.   Objective: Vital signs in last 24 hours: Temp:  [97.7 F (36.5 C)-99.7 F (37.6 C)] 97.7 F (36.5 C) (06/25 0804) Pulse Rate:  [103-112] 112 (06/25 0310) Resp:  [17-22] 22 (06/25 0310) BP: (96-129)/(59-76) 126/75 (06/25 0804) SpO2:  [97 %-100 %] 97 % (06/25 0310) Last BM Date: 03/07/17  Intake/Output from previous day: 06/24 0701 - 06/25 0700 In: 550 [IV Piggyback:550] Out: 1050 [Urine:1050] Intake/Output this shift: No intake/output data recorded.  PE: Gen:  Alert, NAD, pleasant, cooperative, well appearing Card:  RRR, no M/G/R heard Pulm:  Rate and effort normal Skin: warm and dry Genitourinary: Labia appears red and slightly inflamed Large area of induration noted to left inner proximal thigh, roughly 6cm of induration that tracks anteriorly, mild surrounding erythema, no area of fluctuance noted, TTP   Musculoskeletal: Normal range of motion. She exhibits no edema, tenderness or deformity.   Lab Results:   Recent Labs  03/09/17 0945 03/10/17 0502  WBC 11.1* 9.2  HGB 12.8 10.8*  HCT 38.3 33.2*  PLT 288 294   BMET  Recent Labs  03/09/17 0945 03/10/17 0502  NA 133* 134*  K 3.4* 2.9*  CL 110 110  CO2 13* 20*  GLUCOSE 198* 207*  BUN <5* <5*  CREATININE 0.62 0.58  CALCIUM 8.8* 8.5*   PT/INR No results for input(s): LABPROT, INR in the last 72 hours. CMP     Component Value Date/Time   NA 134 (L) 03/10/2017 0502   K 2.9 (L) 03/10/2017 0502   CL 110 03/10/2017 0502   CO2 20 (L) 03/10/2017 0502   GLUCOSE 207 (H) 03/10/2017 0502   BUN <5 (L) 03/10/2017 0502   CREATININE 0.58 03/10/2017 0502   CALCIUM 8.5 (L) 03/10/2017 0502   PROT 9.0 (H) 03/08/2017 1309   ALBUMIN 3.7 03/08/2017 1309   AST 16 03/08/2017 1309   ALT 12 (L) 03/08/2017 1309   ALKPHOS  303 (H) 03/08/2017 1309   BILITOT 1.4 (H) 03/08/2017 1309   GFRNONAA >60 03/10/2017 0502   GFRAA >60 03/10/2017 0502   Lipase  No results found for: LIPASE  Studies/Results: Ct Head Wo Contrast  Result Date: 03/08/2017 CLINICAL DATA:  51 year old female with a history weakness and slurred speech EXAM: CT HEAD WITHOUT CONTRAST TECHNIQUE: Contiguous axial images were obtained from the base of the skull through the vertex without intravenous contrast. COMPARISON:  None. FINDINGS: Brain: No acute intracranial hemorrhage. No midline shift or mass effect. Gray-white differentiation maintained. Unremarkable appearance of the ventricular system. Vascular: Unremarkable. Skull: No acute fracture.  No aggressive bone lesion identified. Sinuses/Orbits: Unremarkable appearance of the orbits. Mastoid air cells clear. No middle ear effusion. No significant sinus disease. Other: None IMPRESSION: No CT evidence of acute intracranial abnormality. Aspects is 10 These results were called by telephone at the time of interpretation on 03/08/2017 at 1:28 pm to Dr. Donnetta HutchingBRIAN COOK , who verbally acknowledged these results. Electronically Signed   By: Gilmer MorJaime  Wagner D.O.   On: 03/08/2017 13:29   Ct Femur Left W Contrast  Result Date: 03/09/2017 CLINICAL DATA:  Abscess.  Pain. EXAM: CT OF THE LOWER RIGHT EXTREMITY WITH CONTRAST TECHNIQUE: Multidetector CT imaging of the lower right extremity was performed according to the standard protocol following intravenous contrast administration. COMPARISON:  None. CONTRAST:  100 cc Isovue-300 FINDINGS: Bones/Joint/Cartilage Normal. Soft tissues There is abnormal soft tissue stranding in the subcutaneous fat at the medial aspect of the proximal left thigh superficial to the left sartorius muscle. The soft tissue stranding extends around the sartorius muscle but there is no abscess. No other abnormality. IMPRESSION: Findings consistent with cellulitis of the inner aspect of the proximal left  thigh. Possible involvement of the adjacent sartorius muscle consistent with focal myositis. Electronically Signed   By: Francene Boyers M.D.   On: 03/09/2017 14:27   Dg Chest Portable 1 View  Result Date: 03/08/2017 CLINICAL DATA:  51 year old female with a history of dyspnea EXAM: PORTABLE CHEST 1 VIEW COMPARISON:  11/09/2015, 09/21/2006 FINDINGS: Cardiomediastinal silhouette unchanged in size and contour. No pneumothorax or pleural effusion.  No confluent airspace disease. No evidence of central vascular congestion or edema. No displaced fracture IMPRESSION: No radiographic evidence of acute cardiopulmonary disease Electronically Signed   By: Gilmer Mor D.O.   On: 03/08/2017 14:26    Anti-infectives: Anti-infectives    Start     Dose/Rate Route Frequency Ordered Stop   03/09/17 2200  vancomycin (VANCOCIN) IVPB 750 mg/150 ml premix     750 mg 150 mL/hr over 60 Minutes Intravenous Every 12 hours 03/09/17 1006     03/09/17 1030  vancomycin (VANCOCIN) 1,250 mg in sodium chloride 0.9 % 250 mL IVPB     1,250 mg 166.7 mL/hr over 90 Minutes Intravenous  Once 03/09/17 1006 03/09/17 1159   03/09/17 1030  piperacillin-tazobactam (ZOSYN) IVPB 3.375 g     3.375 g 12.5 mL/hr over 240 Minutes Intravenous Every 8 hours 03/09/17 1006         Assessment/Plan HTN DKA DM type I CKD - creatinine 0.62  Left thigh cellulitis  - area of induration of left thigh - CT of left femur shows no abscess, cellulitis of inner aspect of proximal left thigh possible involvement of sartorius muscle - WBC 9.2 - IV Vanc and Zosyn  Plan: no abscess so conservative measures at this time, continue IV abx, we will continue to follow   LOS: 1 day    Jerre Simon , [facility] 03/10/2017, 8:15 AM Pager: [phone] Consults: [phone] Mon-Fri 7:00 am-4:30 pm Sat-Sun 7:00 am-11:30 am

## 2017-03-11 NOTE — Op Note (Signed)
Preoperative diagnosis: left thigh abscess Postoperative diagnosis: same as above Procedure: Incision and drainage of left thigh abscess Surgeon: Dr Harden MoMatt Jariel Drost Asst: Dr Evangeline GulaBrooke Miller Anes: general Specimens cultures to micro EBL minimal Drains none Complications none Sponge and needle count correct dispo to recovery stable   Indications: this is a [age] yof who has a left thigh abscess that requires incision and drainage. We discussed doing this in the OR.   Procedure: After informed consent obtained patient taken to the OR. She was already on abx.  She had scds in place.  She was placed under general anesthesia without complication.  She was placed in lithotomy position and appropriately padded.  She was prepped and draped in standard sterile surgical fashion. Timeout was performed.  She had two large areas that were fluctuant in the very proximal medial left thigh.  We initially made an elliptical incision overlying the inferior area.  There was purulence present.  I took cultures.  That was opened completely. This connected to that superior area.  I made another elliptical incision over the second area to adequately drain and pack this.  This was completely irrigated. The wounds were packed and dressings were placed.  She tolerated well, was extubated and transferred to recovery stable.

## 2017-03-11 NOTE — Anesthesia Procedure Notes (Signed)
Procedure Name: LMA Insertion Date/Time: 03/11/2017 11:23 AM Performed by: Sharlene DoryWALKER, Milany Geck E Pre-anesthesia Checklist: Patient identified, Emergency Drugs available, Suction available and Patient being monitored Patient Re-evaluated:Patient Re-evaluated prior to inductionOxygen Delivery Method: Circle system utilized Preoxygenation: Pre-oxygenation with 100% oxygen Intubation Type: IV induction LMA: LMA inserted LMA Size: 4.0 Number of attempts: 1 Placement Confirmation: positive ETCO2 and breath sounds checked- equal and bilateral Tube secured with: Tape Dental Injury: Teeth and Oropharynx as per pre-operative assessment

## 2017-03-12 ENCOUNTER — Encounter (HOSPITAL_COMMUNITY): Payer: Self-pay | Admitting: General Surgery

## 2017-03-12 DIAGNOSIS — I10 Essential (primary) hypertension: Secondary | ICD-10-CM

## 2017-03-12 DIAGNOSIS — E1010 Type 1 diabetes mellitus with ketoacidosis without coma: Principal | ICD-10-CM

## 2017-03-12 DIAGNOSIS — N183 Chronic kidney disease, stage 3 (moderate): Secondary | ICD-10-CM

## 2017-03-12 DIAGNOSIS — L02416 Cutaneous abscess of left lower limb: Secondary | ICD-10-CM

## 2017-03-12 DIAGNOSIS — G9340 Encephalopathy, unspecified: Secondary | ICD-10-CM

## 2017-03-12 LAB — GLUCOSE, CAPILLARY
GLUCOSE-CAPILLARY: 136 mg/dL — AB (ref 65–99)
GLUCOSE-CAPILLARY: 119 mg/dL — AB (ref 65–99)
Glucose-Capillary: 69 mg/dL (ref 65–99)
Glucose-Capillary: 87 mg/dL (ref 65–99)
Glucose-Capillary: 84 mg/dL (ref 65–99)

## 2017-03-12 LAB — CBC
HEMATOCRIT: 31.8 % — AB (ref 36.0–46.0)
Hemoglobin: 10.2 g/dL — ABNORMAL LOW (ref 12.0–15.0)
MCH: 24.7 pg — ABNORMAL LOW (ref 26.0–34.0)
MCHC: 32.1 g/dL (ref 30.0–36.0)
MCV: 77 fL — ABNORMAL LOW (ref 78.0–100.0)
Platelets: 318 10*3/uL (ref 150–400)
RBC: 4.13 MIL/uL (ref 3.87–5.11)
RDW: 14.4 % (ref 11.5–15.5)
WBC: 7.3 10*3/uL (ref 4.0–10.5)

## 2017-03-12 LAB — BASIC METABOLIC PANEL
ANION GAP: 5 (ref 5–15)
CALCIUM: 8.3 mg/dL — AB (ref 8.9–10.3)
CO2: 24 mmol/L (ref 22–32)
Chloride: 107 mmol/L (ref 101–111)
Creatinine, Ser: 0.55 mg/dL (ref 0.44–1.00)
GFR calc Af Amer: >60 mL/min (ref >60)
Glucose, Bld: 113 mg/dL — ABNORMAL HIGH (ref 65–99)
POTASSIUM: 3.3 mmol/L — AB (ref 3.5–5.1)
SODIUM: 136 mmol/L (ref 135–145)

## 2017-03-12 LAB — MAGNESIUM: MAGNESIUM: 1.7 mg/dL (ref 1.7–2.4)

## 2017-03-12 LAB — PROCALCITONIN: PROCALCITONIN: 0.22 ng/mL

## 2017-03-12 MED ORDER — POTASSIUM CHLORIDE CRYS ER 20 MEQ PO TBCR
40.0000 meq | EXTENDED_RELEASE_TABLET | ORAL | Status: AC
  Administered 2017-03-12: 40 meq via ORAL
  Filled 2017-03-12: qty 2

## 2017-03-12 MED ORDER — POTASSIUM CHLORIDE CRYS ER 20 MEQ PO TBCR
40.0000 meq | EXTENDED_RELEASE_TABLET | Freq: Once | ORAL | Status: AC
  Administered 2017-03-12: 40 meq via ORAL
  Filled 2017-03-12: qty 2

## 2017-03-12 NOTE — Progress Notes (Signed)
Results for Aldean JewettRICK, Melek D (MRN [id number]) as of 03/12/2017 18:44  Ref. Range 03/12/2017 16:27 03/12/2017 17:03  Glucose-Capillary Latest Ref Range: 65 - 99 mg/dL 69 87    Pt's 1st bg check was 69, asymptomatic, given 15 grams of carb (orange juice). Recheck was 87. Charge nurse aware and paged MD Rizwan to make aware. Will cont. To monitor pt.

## 2017-03-12 NOTE — Progress Notes (Signed)
Patient ID: Stephanie Huber, female   DOB: 05/20/1966, 51 y.o.   MRN: [id number]  [facility]  1 Day Post-Op  Subjective: CC- left thigh abscess Patient feeling well this morning. States that she has less pain in thigh than prior to surgery. Anxious about dressing change. Tolerating regular diet.  Objective: Vital signs in last 24 hours: Temp:  [97.7 F (36.5 C)-99.1 F (37.3 C)] 98.4 F (36.9 C) (06/27 0517) Pulse Rate:  [90-120] 100 (06/27 0517) Resp:  [14-18] 17 (06/27 0517) BP: (115-153)/(69-99) 136/92 (06/27 0517) SpO2:  [98 %-100 %] 100 % (06/27 0517) Last BM Date: 03/08/17  Intake/Output from previous day: 06/26 0701 - 06/27 0700 In: 2798.5 [P.O.:60; I.V.:2238.5; IV Piggyback:500] Out: 10 [Blood:10] Intake/Output this shift: No intake/output data recorded.  PE: Gen:  Alert, NAD, pleasant HEENT: EOM's intact, pupils equal  Pulm:  effort normal Abd: Soft, NT/ND, +BS Psych: A&Ox3  LLE: proximal/medial thigh abscess s/p I&D with 2 incisions, no fluctuance, induration improved, no surrounding erythema, no active purulent drainage >> packing strips removed and repacked with saline dampened gauze  Lab Results:   Recent Labs  03/10/17 0502 03/11/17 0637  WBC 9.2 7.6  HGB 10.8* 10.7*  HCT 33.2* 32.7*  PLT 294 313   BMET  Recent Labs  03/10/17 0502  03/11/17 0637 03/11/17 1648  NA 134*  --  139  --   K 2.9*  < > 2.5* 3.1*  CL 110  --  108  --   CO2 20*  --  22  --   GLUCOSE 207*  --  145*  --   BUN <5*  --  <5*  --   CREATININE 0.58  --  0.62  --   CALCIUM 8.5*  --  8.3*  --   < > = values in this interval not displayed. PT/INR No results for input(s): LABPROT, INR in the last 72 hours. CMP     Component Value Date/Time   NA 139 03/11/2017 0637   K 3.1 (L) 03/11/2017 1648   CL 108 03/11/2017 0637   CO2 22 03/11/2017 0637   GLUCOSE 145 (H) 03/11/2017 0637   BUN <5 (L) 03/11/2017 0637   CREATININE 0.62 03/11/2017 0637    CALCIUM 8.3 (L) 03/11/2017 0637   PROT 9.0 (H) 03/08/2017 1309   ALBUMIN 3.7 03/08/2017 1309   AST 16 03/08/2017 1309   ALT 12 (L) 03/08/2017 1309   ALKPHOS 303 (H) 03/08/2017 1309   BILITOT 1.4 (H) 03/08/2017 1309   GFRNONAA >60 03/11/2017 0637   GFRAA >60 03/11/2017 0637   Lipase  No results found for: LIPASE     Studies/Results: No results found.  Anti-infectives: Anti-infectives    Start     Dose/Rate Route Frequency Ordered Stop   03/10/17 1400  ceFAZolin (ANCEF) IVPB 2g/100 mL premix     2 g 200 mL/hr over 30 Minutes Intravenous Every 8 hours 03/10/17 1038     03/09/17 2200  vancomycin (VANCOCIN) IVPB 750 mg/150 ml premix  Status:  Discontinued     750 mg 150 mL/hr over 60 Minutes Intravenous Every 12 hours 03/09/17 1006 03/10/17 1004   03/09/17 1030  vancomycin (VANCOCIN) 1,250 mg in sodium chloride 0.9 % 250 mL IVPB     1,250 mg 166.7 mL/hr over 90 Minutes Intravenous  Once 03/09/17 1006 03/09/17 1159   03/09/17 1030  piperacillin-tazobactam (ZOSYN) IVPB 3.375 g  Status:  Discontinued     3.375 g 12.5 mL/hr  over 240 Minutes Intravenous Every 8 hours 03/09/17 1006 03/10/17 1004       Assessment/Plan left thigh abscess S/p Incision and drainage of left thigh abscess 6/26 Dr. Dwain Sarna - POD1 - gram stain growing gram positive cocci, gram negative rods, and rare yeast; culture pending  DKA with uncontrolled IDDM-1 CKD-III HTN  ID - ancef 6/26>>, zosyn 6/24>>6/25, vanco 6/24>>6/25 FEN - heart healthy/carb modified VTE - SCDs, lovenox  Plan - Labs pending. Dressing changed. Continue wound care BID (pack both incisions with saline dampened packing strips). Ok to shower with wound open. Continue antibiotics and follow culture. I will order [facility] RN for wound care. Continue working on better glucose control.    LOS: 3 days    Edson Snowball , [facility] 03/12/2017, 9:29 AM Pager: [phone] Consults: [phone] Mon-Fri 7:00 am-4:30  pm Sat-Sun 7:00 am-11:30 am

## 2017-03-12 NOTE — Anesthesia Postprocedure Evaluation (Signed)
Anesthesia Post Note  Patient: Stephanie Huber  Procedure(s) Performed: Procedure(s) (LRB): INCISION AND DRAINAGE LEFT THIGH ABSCESS (Left)     Patient location during evaluation: PACU Anesthesia Type: General Level of consciousness: awake, awake and alert and oriented Pain management: pain level controlled Vital Signs Assessment: post-procedure vital signs reviewed and stable Respiratory status: spontaneous breathing, nonlabored ventilation and respiratory function stable Cardiovascular status: blood pressure returned to baseline Anesthetic complications: no    Last Vitals:  Vitals:   03/11/17 2126 03/12/17 0517  BP: 115/69 (!) 136/92  Pulse: (!) 112 100  Resp:  17  Temp: 37.3 C 36.9 C    Last Pain:  Vitals:   03/12/17 0517  TempSrc: Oral  PainSc:                  Wilian Kwong COKER

## 2017-03-12 NOTE — Progress Notes (Signed)
Inpatient Diabetes Program Recommendations  AACE/ADA: New Consensus Statement on Inpatient Glycemic Control (2015)  Target Ranges:  Prepandial:   less than 140 mg/dL      Peak postprandial:   less than 180 mg/dL (1-2 hours)      Critically ill patients:  140 - 180 mg/dL   Results for Stephanie Huber, Stephanie Huber (MRN [id number]) as of 03/12/2017 13:19  Ref. Range 03/12/2017 07:47 03/12/2017 11:25  Glucose-Capillary Latest Ref Range: 65 - 99 mg/dL 409136 (H) 811119 (H)    Home DM Meds: Lantus 30 units daily       Novolog 5 units TID with meals (ran out 3 months ago)  Current Insulin Orders: Lantus 25 units daily       Novolog Sensitive Correction Scale/ SSI (0-9 units) TID AC + HS      Novolog 5 units TID with meals     Spoke with pt today about her home diabetes regimen.  Pt told me she takes Lantus 30 units daily but stopped her Novolog about 3 months ago.  Ran out and did not go see her PCP for a refill for more Novolog.  Stated she was taking a set dose of Novolog but could not remember the dose.  Discussed with pt that the physician here in the hospital plans to discharge pt home on Novolog.  Pt asked me if she could get a free bottle of Novolog.  I told pt that since she has Medicaid she will not be able to get any free medications from the hospital (Lantus and Novolog are both $3 at the pharmacy with her Medicaid).  Hospital will not allow pt to use MATCH program since she has Medicaid.  Does not have a CBG meter at home and asked me for a Rx for a meter as well.  Discussed with pt that she will need to take the Novolog at home with meals.  Pt stated she knows how to draw up insulin and is comfortable giving injections (uses abdomen).  Reminded pt about the importance of rotating injection sites.     MD- When pt ready to discharge home, please give pt the following Rxs:  1. Novolog insulin vial- Order # [id number]  2. Insulin syringes- Order # [id number]  3. CBG Meter and supplies- Order #  [id number]     --Will follow patient during hospitalization--  Ambrose FinlandJeannine Johnston Nyan Dufresne RN, MSN, CDE Diabetes Coordinator Inpatient Glycemic Control Team Team Pager: [phone] (8a-5p)

## 2017-03-12 NOTE — Progress Notes (Signed)
Pharmacy Antibiotic Note  Stephanie Huber is a 51 y.o. female admitted on 03/08/2017 with left thigh abscess.  Pharmacy has been consulted for Cefazolin dosing.   Day # 4 antibiotics. Received Vanc/Zosyn x 1 day then changed to Cefazolin per ID recommendation.  POD #1 I&D.  Abscess culture pending.  Plan:  Continue Cefazolin 2 gm IV q8hrs.  Follow renal function, final culture data, progress.  Height: 5\' 8"  (172.7 cm) Weight: 129 lb 1.6 oz (58.6 kg) IBW/kg (Calculated) : 63.9  Temp (24hrs), Avg:98.6 F (37 C), Min:98.4 F (36.9 C), Max:99.1 F (37.3 C)   Recent Labs Lab 03/08/17 1332 03/08/17 1605 03/08/17 1835  03/09/17 0555 03/09/17 0945 03/10/17 0502 03/11/17 0637 03/12/17 1251  WBC  --   --  18.3*  --   --  11.1* 9.2 7.6 7.3  CREATININE  --   --  1.11*  < > 0.62 0.62 0.58 0.62 0.55  LATICACIDVEN 2.78* 1.64  --   --   --   --   --   --   --   < > = values in this interval not displayed.  Estimated Creatinine Clearance: 77.8 mL/min (by C-G formula based on SCr of 0.55 mg/dL).    No Known Allergies  Antimicrobials this admission: Vanc 6/24 >>6/25 Zosyn 6/24 >>6/25 Cefazolin 6/25>>  Dose adjustments this admission:  n/a  Microbiology results:  6/23 Blood x 2 - ng x 4 days so far  6/23 MRSA PCR: negative  6/23 UCx: contaminated with mult species  6/23 HIV neg  6/26 abscess l(left thigh) culture - rare GNR, few GPC in pairs  Thank you for allowing pharmacy to be a part of this patient's care.  Dennie Fettersgan, Vonnie Ligman Donovan, [place] Pager: [phone] 03/12/2017 3:42 PM

## 2017-03-12 NOTE — Progress Notes (Signed)
PROGRESS NOTE    Stephanie Huber   ZOX:[id number]  DOB: September 09, 1966  DOA: 03/08/2017 PCP: Fleet Contras, MD   Brief Narrative:  Patient is a 51 year old female with CKD stage III, hypertension, diabetes mellitus, insulin-dependent presented with nausea, vomiting, generalized weakness, slurred speech, altered mental status on the day of admission. She was found in profound DKA, 26, bicarbonate less than 7, creatinine 1.9.   Patient was admitted to stepdown unit on insulin drip and DKA protocol. Next morning patient was alert awake and oriented, informed that she had "boil" hard indurated tender area, likely could have precipitated DKA    Subjective: No complaints other than having pain when dressing was changed this AM. No nausea, vomiting, constipation, diarrhea, chest pain or dysuria.   Assessment & Plan:  Principal Problem: DKA (diabetic ketoacidoses) (HCC)with uncontrolled diabetes mellitus type 1, insulin-dependent,  - Bicarbonate of less than 7, gap 26 at the time of admission. - states that she had "boil" on the left thigh for the last 2-3 days,likely the precipitating cause for the DKA.  - DKA resolved, off the IV insulin drip, transitioned to subcutaneous insulin.  - Hemoglobin A1c >15.5 - continue Lantus to 25 units, NovoLog to 5 units 3 times a day with meals, continue sliding scale insulin - Diabetic coordinator following, nutrition consult for diet education   Active Problems: Abscess of the left thigh - hard indurated area on the left thigh, tender, likely precipitant cause for the DKA - CT of the left femur showed cellulitis,  however area tender and fluctuant, surgery consulted - Patient was placed on IV vancomycin and Zosyn. Discussed with Dr. Ilsa Iha, infectious disease, recommended to narrow down to IV Ancef - s/p I and D on 6/26- will f/u with Gen surgery as outpt and have HHRN to help change dressings - f/u on cultures- gr stain shows Gr + cocci in  pairs, rare gr neg rods and rare yeast  Metabolic acidosis - Likely due to profound DKA and lactic acidosis and left thigh abscess - Resolved  Acute encephalopathy -Resolved, Likely due to #1, dehydration, infection - UA negative for UTI, CT head negative for any acute intracranial abnormalities  CKD (chronic kidney disease), stage III - Currently creatinine at baseline - Continue gentle hydration  Essential hypertension -  BP stable   Hypokalemia - continue to replace  Hypomagnesemia - Magnesium 1.5, replaced    DVT prophylaxis: Lovenox Code Status: Full code Family Communication:  Disposition Plan: home tomorrow Consultants:   gen surgery Procedures:   I and D left thigh Antimicrobials:  Anti-infectives    Start     Dose/Rate Route Frequency Ordered Stop   03/10/17 1400  ceFAZolin (ANCEF) IVPB 2g/100 mL premix     2 g 200 mL/hr over 30 Minutes Intravenous Every 8 hours 03/10/17 1038     03/09/17 2200  vancomycin (VANCOCIN) IVPB 750 mg/150 ml premix  Status:  Discontinued     750 mg 150 mL/hr over 60 Minutes Intravenous Every 12 hours 03/09/17 1006 03/10/17 1004   03/09/17 1030  vancomycin (VANCOCIN) 1,250 mg in sodium chloride 0.9 % 250 mL IVPB     1,250 mg 166.7 mL/hr over 90 Minutes Intravenous  Once 03/09/17 1006 03/09/17 1159   03/09/17 1030  piperacillin-tazobactam (ZOSYN) IVPB 3.375 g  Status:  Discontinued     3.375 g 12.5 mL/hr over 240 Minutes Intravenous Every 8 hours 03/09/17 1006 03/10/17 1004       Objective: Vitals:   03/11/17 1422  03/11/17 2126 03/12/17 0517 03/12/17 1357  BP: 129/86 115/69 (!) 136/92 132/75  Pulse: (!) 105 (!) 112 100 96  Resp: 17  17 20   Temp: 98.2 F (36.8 C) 99.1 F (37.3 C) 98.4 F (36.9 C) 98.4 F (36.9 C)  TempSrc: Oral Oral Oral Oral  SpO2: 100% 100% 100% 100%  Weight:      Height:        Intake/Output Summary (Last 24 hours) at 03/12/17 1657 Last data filed at 03/12/17 0300  Gross per 24  hour  Intake           1388.5 ml  Output                0 ml  Net           1388.5 ml   Filed Weights   03/08/17 1323 03/10/17 1603  Weight: 55.4 kg (122 lb 1 oz) 58.6 kg (129 lb 1.6 oz)    Examination: General exam: Appears comfortable  HEENT: PERRLA, oral mucosa moist, no sclera icterus or thrush Respiratory system: Clear to auscultation. Respiratory effort normal. Cardiovascular system: S1 & S2 heard, RRR.  No murmurs  Gastrointestinal system: Abdomen soft, non-tender, nondistended. Normal bowel sound. No organomegaly Central nervous system: Alert and oriented. No focal neurological deficits. Extremities: No cyanosis, clubbing or edema- left thigh dressing not opened Skin: No rashes or ulcers Psychiatry:  Mood & affect appropriate.     Data Reviewed: I have personally reviewed following labs and imaging studies  CBC:  Recent Labs Lab 03/08/17 1835 03/09/17 0945 03/10/17 0502 03/11/17 0637 03/12/17 1251  WBC 18.3* 11.1* 9.2 7.6 7.3  HGB 13.8 12.8 10.8* 10.7* 10.2*  HCT 43.0 38.3 33.2* 32.7* 31.8*  MCV 80.4 76.0* 76.1* 75.9* 77.0*  PLT 312 288 294 313 318   Basic Metabolic Panel:  Recent Labs Lab 03/09/17 0555 03/09/17 0945 03/10/17 0502 03/10/17 1245 03/11/17 0637 03/11/17 0932 03/11/17 1648 03/12/17 1251  NA 134* 133* 134*  --  139  --   --  136  K 3.2* 3.4* 2.9* 3.0* 2.5*  --  3.1* 3.3*  CL 111 110 110  --  108  --   --  107  CO2 15* 13* 20*  --  22  --   --  24  GLUCOSE 144* 198* 207*  --  145*  --   --  113*  BUN 5* <5* <5*  --  <5*  --   --  <5*  CREATININE 0.62 0.62 0.58  --  0.62  --   --  0.55  CALCIUM 8.7* 8.8* 8.5*  --  8.3*  --   --  8.3*  MG  --   --   --   --   --  1.5*  --  1.7   GFR: Estimated Creatinine Clearance: 77.8 mL/min (by C-G formula based on SCr of 0.55 mg/dL). Liver Function Tests:  Recent Labs Lab 03/08/17 1309  AST 16  ALT 12*  ALKPHOS 303*  BILITOT 1.4*  PROT 9.0*  ALBUMIN 3.7   No results for input(s): LIPASE,  AMYLASE in the last 168 hours. No results for input(s): AMMONIA in the last 168 hours. Coagulation Profile: No results for input(s): INR, PROTIME in the last 168 hours. Cardiac Enzymes: No results for input(s): CKTOTAL, CKMB, CKMBINDEX, TROPONINI in the last 168 hours. BNP (last 3 results) No results for input(s): PROBNP in the last 8760 hours. HbA1C: No results for input(s): HGBA1C in the last  72 hours. CBG:  Recent Labs Lab 03/11/17 1704 03/11/17 2143 03/12/17 0747 03/12/17 1125 03/12/17 1627  GLUCAP 210* 178* 136* 119* 69   Lipid Profile: No results for input(s): CHOL, HDL, LDLCALC, TRIG, CHOLHDL, LDLDIRECT in the last 72 hours. Thyroid Function Tests: No results for input(s): TSH, T4TOTAL, FREET4, T3FREE, THYROIDAB in the last 72 hours. Anemia Panel: No results for input(s): VITAMINB12, FOLATE, FERRITIN, TIBC, IRON, RETICCTPCT in the last 72 hours. Urine analysis:    Component Value Date/Time   COLORURINE STRAW (A) 03/08/2017 1327   APPEARANCEUR HAZY (A) 03/08/2017 1327   LABSPEC 1.017 03/08/2017 1327   PHURINE 5.0 03/08/2017 1327   GLUCOSEU >=500 (A) 03/08/2017 1327   HGBUR SMALL (A) 03/08/2017 1327   BILIRUBINUR NEGATIVE 03/08/2017 1327   KETONESUR 80 (A) 03/08/2017 1327   PROTEINUR 100 (A) 03/08/2017 1327   UROBILINOGEN 0.2 02/20/2015 1737   NITRITE NEGATIVE 03/08/2017 1327   LEUKOCYTESUR NEGATIVE 03/08/2017 1327   Sepsis Labs: @LABRCNTIP (procalcitonin:4,lacticidven:4) ) Recent Results (from the past 240 hour(s))  Urine culture     Status: Abnormal   Collection Time: 03/08/17  1:27 PM  Result Value Ref Range Status   Specimen Description URINE, RANDOM  Final   Special Requests NONE  Final   Culture MULTIPLE SPECIES PRESENT, SUGGEST RECOLLECTION (A)  Final   Report Status 03/09/2017 FINAL  Final  MRSA PCR Screening     Status: None   Collection Time: 03/08/17  6:23 PM  Result Value Ref Range Status   MRSA by PCR NEGATIVE NEGATIVE Final    Comment:         The GeneXpert MRSA Assay (FDA approved for NASAL specimens only), is one component of a comprehensive MRSA colonization surveillance program. It is not intended to diagnose MRSA infection nor to guide or monitor treatment for MRSA infections.   Culture, blood (routine x 2)     Status: None (Preliminary result)   Collection Time: 03/08/17  6:30 PM  Result Value Ref Range Status   Specimen Description BLOOD LEFT HAND  Final   Special Requests   Final    BOTTLES DRAWN AEROBIC AND ANAEROBIC Blood Culture adequate volume   Culture NO GROWTH 4 DAYS  Final   Report Status PENDING  Incomplete  Culture, blood (routine x 2)     Status: None (Preliminary result)   Collection Time: 03/08/17  6:36 PM  Result Value Ref Range Status   Specimen Description BLOOD LEFT ARM  Final   Special Requests   Final    BOTTLES DRAWN AEROBIC AND ANAEROBIC Blood Culture adequate volume   Culture NO GROWTH 4 DAYS  Final   Report Status PENDING  Incomplete  Surgical pcr screen     Status: None   Collection Time: 03/11/17  2:21 AM  Result Value Ref Range Status   MRSA, PCR NEGATIVE NEGATIVE Final   Staphylococcus aureus NEGATIVE NEGATIVE Final    Comment:        The Xpert SA Assay (FDA approved for NASAL specimens in patients over 27 years of age), is one component of a comprehensive surveillance program.  Test performance has been validated by [facility] for patients greater than or equal to 48 year old. It is not intended to diagnose infection nor to guide or monitor treatment.   Aerobic/Anaerobic Culture (surgical/deep wound)     Status: None (Preliminary result)   Collection Time: 03/11/17 10:53 AM  Result Value Ref Range Status   Specimen Description ABSCESS LEFT THIGH  Final  Special Requests NONE  Final   Gram Stain   Final    FEW WBC PRESENT, PREDOMINANTLY PMN FEW GRAM POSITIVE COCCI IN PAIRS RARE GRAM NEGATIVE RODS RARE YEAST    Culture CULTURE REINCUBATED FOR BETTER GROWTH  Final     Report Status PENDING  Incomplete         Radiology Studies: No results found.    Scheduled Meds: . enoxaparin (LOVENOX) injection  40 mg Subcutaneous Q24H  . feeding supplement (GLUCERNA SHAKE)  237 mL Oral TID BM  . insulin aspart  0-5 Units Subcutaneous QHS  . insulin aspart  0-9 Units Subcutaneous TID WC  . insulin aspart  5 Units Subcutaneous TID WC  . insulin glargine  25 Units Subcutaneous Daily  . potassium chloride  40 mEq Oral Once   Continuous Infusions: .  ceFAZolin (ANCEF) IV 2 g (03/12/17 1416)     LOS: 3 days    Time spent in minutes: 35    Calvert Cantor, MD [facility] Pager: [email or web address] Password [facility] 03/12/2017, 4:57 PM

## 2017-03-13 DIAGNOSIS — E440 Moderate protein-calorie malnutrition: Secondary | ICD-10-CM

## 2017-03-13 DIAGNOSIS — E1065 Type 1 diabetes mellitus with hyperglycemia: Secondary | ICD-10-CM

## 2017-03-13 DIAGNOSIS — IMO0002 Reserved for concepts with insufficient information to code with codable children: Secondary | ICD-10-CM

## 2017-03-13 DIAGNOSIS — E1022 Type 1 diabetes mellitus with diabetic chronic kidney disease: Secondary | ICD-10-CM

## 2017-03-13 LAB — GLUCOSE, CAPILLARY
GLUCOSE-CAPILLARY: 244 mg/dL — AB (ref 65–99)
GLUCOSE-CAPILLARY: 173 mg/dL — AB (ref 65–99)
Glucose-Capillary: 181 mg/dL — ABNORMAL HIGH (ref 65–99)

## 2017-03-13 LAB — BASIC METABOLIC PANEL
Anion gap: 6 (ref 5–15)
CALCIUM: 8.1 mg/dL — AB (ref 8.9–10.3)
CO2: 25 mmol/L (ref 22–32)
CREATININE: 0.59 mg/dL (ref 0.44–1.00)
Chloride: 103 mmol/L (ref 101–111)
GFR calc non Af Amer: >60 mL/min (ref >60)
Glucose, Bld: 265 mg/dL — ABNORMAL HIGH (ref 65–99)
Potassium: 3.5 mmol/L (ref 3.5–5.1)
SODIUM: 134 mmol/L — AB (ref 135–145)

## 2017-03-13 LAB — CULTURE, BLOOD (ROUTINE X 2)
Culture: NO GROWTH
Culture: NO GROWTH
SPECIAL REQUESTS: ADEQUATE
Special Requests: ADEQUATE

## 2017-03-13 MED ORDER — "INSULIN SYRINGE 28G X 1/2"" 0.5 ML MISC": 1.0000 | Freq: Three times a day (TID) | 0 refills | Status: AC

## 2017-03-13 MED ORDER — SODIUM CHLORIDE 0.9 % IV SOLN
3.0000 g | Freq: Four times a day (QID) | INTRAVENOUS | Status: DC
  Filled 2017-03-13: qty 3

## 2017-03-13 MED ORDER — INSULIN ASPART 100 UNIT/ML ~~LOC~~ SOLN: 0.0000 [IU] | Freq: Three times a day (TID) | SUBCUTANEOUS | 11 refills | Status: AC

## 2017-03-13 MED ORDER — FLUCONAZOLE 100 MG PO TABS: 100.0000 mg | ORAL_TABLET | Freq: Every day | ORAL | 0 refills | Status: AC

## 2017-03-13 MED ORDER — FREESTYLE SYSTEM KIT: 1.0000 | PACK | 1 refills | Status: AC | PRN

## 2017-03-13 MED ORDER — AMOXICILLIN-POT CLAVULANATE 875-125 MG PO TABS: 1.0000 | ORAL_TABLET | Freq: Two times a day (BID) | ORAL | 0 refills | Status: AC

## 2017-03-13 MED ORDER — GLUCERNA SHAKE PO LIQD: 237.0000 mL | Freq: Three times a day (TID) | ORAL | 0 refills | Status: DC

## 2017-03-13 MED ORDER — FREESTYLE SYSTEM KIT: 1.0000 | PACK | 1 refills | Status: DC | PRN

## 2017-03-13 MED ORDER — INSULIN GLARGINE 100 UNIT/ML ~~LOC~~ SOLN: 25.0000 [IU] | Freq: Every day | SUBCUTANEOUS | Status: DC

## 2017-03-13 MED ORDER — GLUCERNA SHAKE PO LIQD: 237.0000 mL | Freq: Three times a day (TID) | ORAL | 0 refills | Status: AC

## 2017-03-13 MED ORDER — TRAMADOL HCL 50 MG PO TABS: 50.0000 mg | ORAL_TABLET | Freq: Four times a day (QID) | ORAL | Status: AC | PRN

## 2017-03-13 MED ORDER — INSULIN GLARGINE 100 UNIT/ML ~~LOC~~ SOLN
20.0000 [IU] | Freq: Every day | SUBCUTANEOUS | Status: DC
  Administered 2017-03-13: 20 [IU] via SUBCUTANEOUS
  Filled 2017-03-13: qty 0.2

## 2017-03-13 MED ORDER — DIPHENHYDRAMINE HCL 25 MG PO CAPS
25.0000 mg | ORAL_CAPSULE | Freq: Four times a day (QID) | ORAL | Status: DC | PRN
  Administered 2017-03-13: 25 mg via ORAL
  Filled 2017-03-13: qty 1

## 2017-03-13 MED ORDER — SODIUM CHLORIDE 0.9 % IV SOLN
3.0000 g | Freq: Four times a day (QID) | INTRAVENOUS | Status: DC
  Administered 2017-03-13: 3 g via INTRAVENOUS
  Filled 2017-03-13 (×3): qty 3

## 2017-03-13 NOTE — Discharge Instructions (Signed)
WOUND CARE: - dressing to be changed twice daily - supplies: sterile saline, packing strips, scissors, ABD pads, tape  - remove dressing and all packing carefully, moistening with sterile saline as needed to avoid packing/internal dressing sticking to the wound. - clean edges of skin around the wound with water/gauze, making sure there is no tape debris or leakage left on skin that could cause skin irritation or breakdown. - dampen packing strips with sterile saline and pack both wounds from wound base to skin level. Wound can be packed loosely.  - cover wound with a dry ABD pad and secure with tape.  - write the date/time on the dry dressing/tape to better track when the last dressing change occurred. - change dressing as needed if leakage occurs, wound gets contaminated, or patient requests to shower. - patient may shower daily with wound open and following the shower the wound should be dried and a clean dressing placed.

## 2017-03-13 NOTE — Progress Notes (Addendum)
Inpatient Diabetes Program Recommendations  AACE/ADA: New Consensus Statement on Inpatient Glycemic Control (2015)  Target Ranges:  Prepandial:   less than 140 mg/dL      Peak postprandial:   less than 180 mg/dL (1-2 hours)      Critically ill patients:  140 - 180 mg/dL   Lab Results  Component Value Date   GLUCAP 244 (H) 03/13/2017   HGBA1C >15.5 (H) 03/08/2017    Review of Glycemic Control  Diabetes history: DM 2 Outpatient Diabetes medications: Lantus 30 units Daily, Novolog 5 units tid with meals Current orders for Inpatient glycemic control: Lantus 20 units, Novolog Sensitive Correction tid + Novolog HS scale, Novolog 5 units tid meal coverage  Inpatient Diabetes Program Recommendations:    Mild hypoglycemia yesterday due to amount of Novolog given and patient only ate 35% of meal when meal coverage was given.   Fasting glucose 200's this am. Please consider increasing Lantus back up to 25 units. Verify parameters on meal coverage.  Thanks,  Christena DeemShannon Gavino Fouch RN, MSN, [facility] Inpatient Diabetes Coordinator Team Pager [phone] (8a-5p)

## 2017-03-13 NOTE — Progress Notes (Signed)
Patient ID: Stephanie Huber, female   DOB: 20-Feb-1966, 51 y.o.   MRN: [id number]  [facility] Progress Note  2 Days Post-Op  Subjective: CC- thigh abscess Feeling a little better today. States that her thigh is feeling better at rest, but dressing changes are still painful. Tolerating diet. Denies n/v. Afebrile.  Objective: Vital signs in last 24 hours: Temp:  [97.8 F (36.6 C)-98.4 F (36.9 C)] 97.8 F (36.6 C) (06/28 0522) Pulse Rate:  [94-96] 94 (06/28 0522) Resp:  [16-20] 16 (06/28 0522) BP: (119-145)/(74-89) 145/89 (06/28 0522) SpO2:  [100 %] 100 % (06/28 0522) Last BM Date: 03/11/17  Intake/Output from previous day: 06/27 0701 - 06/28 0700 In: 462 [P.O.:462] Out: 1000 [Urine:1000] Intake/Output this shift: Total I/O In: -  Out: 500 [Urine:500]  PE: Gen:  Alert, NAD, pleasant HEENT: EOM's intact, pupils equal  Pulm:  effort normal Psych: A&Ox3  LLE: proximal/medial thigh abscess s/p I&D with 2 incisions, no fluctuance, less induration, no surrounding erythema, no active purulent drainage  Lab Results:   Recent Labs  03/11/17 0637 03/12/17 1251  WBC 7.6 7.3  HGB 10.7* 10.2*  HCT 32.7* 31.8*  PLT 313 318   BMET  Recent Labs  03/12/17 1251 03/13/17 0614  NA 136 134*  K 3.3* 3.5  CL 107 103  CO2 24 25  GLUCOSE 113* 265*  BUN <5* <5*  CREATININE 0.55 0.59  CALCIUM 8.3* 8.1*   PT/INR No results for input(s): LABPROT, INR in the last 72 hours. CMP     Component Value Date/Time   NA 134 (L) 03/13/2017 0614   K 3.5 03/13/2017 0614   CL 103 03/13/2017 0614   CO2 25 03/13/2017 0614   GLUCOSE 265 (H) 03/13/2017 0614   BUN <5 (L) 03/13/2017 0614   CREATININE 0.59 03/13/2017 0614   CALCIUM 8.1 (L) 03/13/2017 0614   PROT 9.0 (H) 03/08/2017 1309   ALBUMIN 3.7 03/08/2017 1309   AST 16 03/08/2017 1309   ALT 12 (L) 03/08/2017 1309   ALKPHOS 303 (H) 03/08/2017 1309   BILITOT 1.4 (H) 03/08/2017 1309   GFRNONAA >60 03/13/2017 0614   GFRAA >60 03/13/2017 7829   Lipase  No results found for: LIPASE     Studies/Results: No results found.  Anti-infectives: Anti-infectives    Start     Dose/Rate Route Frequency Ordered Stop   03/10/17 1400  ceFAZolin (ANCEF) IVPB 2g/100 mL premix     2 g 200 mL/hr over 30 Minutes Intravenous Every 8 hours 03/10/17 1038     03/09/17 2200  vancomycin (VANCOCIN) IVPB 750 mg/150 ml premix  Status:  Discontinued     750 mg 150 mL/hr over 60 Minutes Intravenous Every 12 hours 03/09/17 1006 03/10/17 1004   03/09/17 1030  vancomycin (VANCOCIN) 1,250 mg in sodium chloride 0.9 % 250 mL IVPB     1,250 mg 166.7 mL/hr over 90 Minutes Intravenous  Once 03/09/17 1006 03/09/17 1159   03/09/17 1030  piperacillin-tazobactam (ZOSYN) IVPB 3.375 g  Status:  Discontinued     3.375 g 12.5 mL/hr over 240 Minutes Intravenous Every 8 hours 03/09/17 1006 03/10/17 1004       Assessment/Plan left thigh abscess S/p Incision and drainage of left thigh abscess 6/26 Dr. Dwain Sarna - POD2 - gram stain growing gram positive cocci, gram negative rods, and rare yeast; culture pending  DKA with uncontrolled IDDM-1 CKD-III HTN  ID - ancef 6/26>>, zosyn 6/24>>6/25, vanco 6/24>>6/25 FEN - heart healthy/carb modified VTE -  SCDs, lovenox  Plan - Patient stable for discharge from surgical standpoint. Continue wound care BID (pack both incisions with saline dampened packing strips). Continue antibiotics for 1 week. Will follow-up in [facility] clinic in 1-2 weeks.    LOS: 4 days    Edson SnowballBROOKE A MILLER , [facility] 03/13/2017, 9:12 AM Pager: [phone] Consults: [phone] Mon-Fri 7:00 am-4:30 pm Sat-Sun 7:00 am-11:30 am

## 2017-03-13 NOTE — Discharge Summary (Addendum)
Physician Discharge Summary  Stephanie Huber:[id number] DOB: Apr 09, 1966 DOA: 03/08/2017  PCP: Nolene Ebbs, MD  Admit date: 03/08/2017 Discharge date: 03/13/2017  Admitted From: home  Disposition:  home   Recommendations for Outpatient Follow-up:  1. Needs improved diabetes controlle  Home Health:  ordered    Discharge Condition:  stable   CODE STATUS:  Full code   Consultations:  gen surgery    Discharge Diagnoses:  Principal Problem:   DKA (diabetic ketoacidoses)   Active Problems:   Abscess of left thigh   Metabolic acidosis   Acute encephalopathy   CKD (chronic kidney disease), stage III   Essential hypertension   Leukocytosis   Malnutrition of moderate degree    Subjective:   Pain is controlled. Appetite continues to be poor- she is drinking fine but is eating < 50% on meals. No new complaints.    Brief Summary: Patient is a 51 year old female with CKD stage III, hypertension, diabetes mellitus, insulin-dependent presented with nausea, vomiting, generalized weakness, slurred speech, altered mental status on the day of admission. She was found in profound DKA, 26, bicarbonate less than 7, creatinine 1.9.   Patient was admitted to stepdown unit on insulin drip and DKA protocol. Next morning patient was alert awake and oriented, informed that shehad "boil" hard indurated tender area, likely could have precipitated DKA  Hospital Course:  Principal Problem: DKA (diabetic ketoacidoses) (HCC)with uncontrolled diabetes mellitus type 1, insulin-dependent,  - Bicarbonate of less than 7, gap 26 at the time of admission. -  Infection likely the precipitating cause for the DKA. - DKA resolved, off the IV insulin drip, transitioned to subcutaneous insulin.  - Hemoglobin A1c >15.5 - admits to not checking her sugars- does not have glucometer - tells me that she has been taking her Lantus appropriately at home- in the hospital, with her current home dose of Lantus,  fasting sugars have been at a reasonable level - she was, also, previously given a prescription for Novolog but never actually took it- I have extensively discussed the importance of controlling her sugars as the relationship between uncontrolled DM and infections and wound healing-  she voices understanding that she needs to be taking both insulins appropriately - Diabetic coordinator has evaluated the patient, nutrition consulted for diet education, RN has done teaching as well   Active Problems: Abscess of the left thigh  - likely precipitant cause for the DKA - CT of the left femur showed cellulitis-  area tender and fluctuant and therefore surgery was consulted - s/p I and D on 6/26- will f/u with Gen surgery as outpt and have HHRN to help change dressings - started on IV vancomycin and Zosyn. Dr Tana Coast spoke with Dr. Graylon Good, infectious disease, and recommended to narrow down to IV Ancef - wound cultures still pending - gr stain shows Gr + cocci in pairs, rare gr neg rods and rare yeast- have switched Keflex to Augmentin and added Fluconazole after discussion with ID today  - will follow up final sensitivities  Metabolic acidosis - Likely due to profound DKA and lactic acidosis and left thigh abscess - Resolved  Acute encephalopathy -Resolved, Likely due to #1, dehydration, infection - UA negative for UTI, CT head negative for any acute intracranial abnormalities  CKD (chronic kidney disease), stage III - Currently creatinine at baseline - Continue gentle hydration  Essential hypertension - BP stable   Hypokalemia - continue to replace  Hypomagnesemia - Magnesium 1.5, replaced   Discharge Instructions  Discharge  Instructions    Diet - low sodium heart healthy    Complete by:  As directed    Diet Carb Modified    Complete by:  As directed    Increase activity slowly    Complete by:  As directed      Allergies as of 03/13/2017   No Known Allergies      Medication List    TAKE these medications   blood glucose meter kit and supplies Kit Dispense based on patient and insurance preference. Use up to four times daily as directed. (FOR ICD-9 250.00, 250.01).   feeding supplement (GLUCERNA SHAKE) Liqd Take 237 mLs by mouth 3 (three) times daily between meals.   insulin aspart 100 UNIT/ML injection Commonly known as:  novoLOG Inject 0-9 Units into the skin 3 (three) times daily with meals. CBG 70 - 120: 0 units  CBG 121 - 150: 1 unit  CBG 151 - 200: 2 units  CBG 201 - 250: 3 units  CBG 251 - 300: 5 units  CBG 301 - 350: 7 units  CBG 351 - 400 9 units   Use this scale until your appetite improves. Then go back to using 50m of Novolog with each meal. What changed:  how much to take  additional instructions   insulin glargine 100 UNIT/ML injection Commonly known as:  LANTUS Inject 0.25 mLs (25 Units total) into the skin daily. What changed:  how much to take  when to take this   Insulin Syringes (Disposable) U-100 1 ML Misc Use as directed   traMADol 50 MG tablet Commonly known as:  ULTRAM Take 1-2 tablets (50-100 mg total) by mouth every 6 (six) hours as needed for moderate pain (use 50 mg for mild pain and 100 mg for moderate pain).      [facility]/2017.   Specialty:  General Surgery Why:  Your appointment is 03/25/17 at 11:45AM. Please arrive 30 minutes prior to your appointment to check in and fill out necessary paperwork. Contact information: [address] [phone]        No Known Allergies   Procedures/Studies: I and D of left thigh  Ct Head Wo Contrast  Result Date: 03/08/2017 CLINICAL DATA:  51year old female with a history weakness and slurred speech EXAM: CT HEAD WITHOUT CONTRAST TECHNIQUE: Contiguous axial images were obtained from the base of the skull through the vertex without intravenous  contrast. COMPARISON:  None. FINDINGS: Brain: No acute intracranial hemorrhage. No midline shift or mass effect. Gray-white differentiation maintained. Unremarkable appearance of the ventricular system. Vascular: Unremarkable. Skull: No acute fracture.  No aggressive bone lesion identified. Sinuses/Orbits: Unremarkable appearance of the orbits. Mastoid air cells clear. No middle ear effusion. No significant sinus disease. Other: None IMPRESSION: No CT evidence of acute intracranial abnormality. Aspects is 10 These results were called by telephone at the time of interpretation on 03/08/2017 at 1:28 pm to Dr. BNat Christen, who verbally acknowledged these results. Electronically Signed   By: JCorrie MckusickD.O.   On: 03/08/2017 13:29   Ct Femur Left W Contrast  Result Date: 03/09/2017 CLINICAL DATA:  Abscess.  Pain. EXAM: CT OF THE LOWER RIGHT EXTREMITY WITH CONTRAST TECHNIQUE: Multidetector CT imaging of the lower right extremity was performed according to the standard protocol following intravenous contrast administration. COMPARISON:  None. CONTRAST:  100 cc Isovue-300 FINDINGS: Bones/Joint/Cartilage Normal. Soft tissues There is  abnormal soft tissue stranding in the subcutaneous fat at the medial aspect of the proximal left thigh superficial to the left sartorius muscle. The soft tissue stranding extends around the sartorius muscle but there is no abscess. No other abnormality. IMPRESSION: Findings consistent with cellulitis of the inner aspect of the proximal left thigh. Possible involvement of the adjacent sartorius muscle consistent with focal myositis. Electronically Signed   By: Lorriane Shire M.D.   On: 03/09/2017 14:27   Dg Chest Portable 1 View  Result Date: 03/08/2017 CLINICAL DATA:  51 year old female with a history of dyspnea EXAM: PORTABLE CHEST 1 VIEW COMPARISON:  11/09/2015, 09/21/2006 FINDINGS: Cardiomediastinal silhouette unchanged in size and contour. No pneumothorax or pleural effusion.  No  confluent airspace disease. No evidence of central vascular congestion or edema. No displaced fracture IMPRESSION: No radiographic evidence of acute cardiopulmonary disease Electronically Signed   By: Corrie Mckusick D.O.   On: 03/08/2017 14:26        Discharge Exam: Vitals:   03/12/17 2046 03/13/17 0522  BP: 119/74 (!) 145/89  Pulse: 94 94  Resp: 18 16  Temp: 98.4 F (36.9 C) 97.8 F (36.6 C)   Vitals:   03/12/17 0517 03/12/17 1357 03/12/17 2046 03/13/17 0522  BP: (!) 136/92 132/75 119/74 (!) 145/89  Pulse: 100 96 94 94  Resp: _0 Temp: 98.4 F (36.9 C) 98.4 F (36.9 C) 98.4 F (36.9 C) 97.8 F (36.6 C)  TempSrc: Oral Oral Oral Oral  SpO2: 100% 100% 100% 100%  Weight:      Height:        General: Pt is alert, awake, not in acute distress Cardiovascular: RRR, S1/S2 +, no rubs, no gallops Respiratory: CTA bilaterally, no wheezing, no rhonchi Abdominal: Soft, NT, ND, bowel sounds + Extremities: no edema, no cyanosis    The results of significant diagnostics from this hospitalization (including imaging, microbiology, ancillary and laboratory) are listed below for reference.     Microbiology: Recent Results (from the past 240 hour(s))  Urine culture     Status: Abnormal   Collection Time: 03/08/17  1:27 PM  Result Value Ref Range Status   Specimen Description URINE, RANDOM  Final   Special Requests NONE  Final   Culture MULTIPLE SPECIES PRESENT, SUGGEST RECOLLECTION (A)  Final   Report Status 03/09/2017 FINAL  Final  MRSA PCR Screening     Status: None   Collection Time: 03/08/17  6:23 PM  Result Value Ref Range Status   MRSA by PCR NEGATIVE NEGATIVE Final    Comment:        The GeneXpert MRSA Assay (FDA approved for NASAL specimens only), is one component of a comprehensive MRSA colonization surveillance program. It is not intended to diagnose MRSA infection nor to guide or monitor treatment for MRSA infections.   Culture, blood (routine x 2)      Status: None (Preliminary result)   Collection Time: 03/08/17  6:30 PM  Result Value Ref Range Status   Specimen Description BLOOD LEFT HAND  Final   Special Requests   Final    BOTTLES DRAWN AEROBIC AND ANAEROBIC Blood Culture adequate volume   Culture NO GROWTH 4 DAYS  Final   Report Status PENDING  Incomplete  Culture, blood (routine x 2)     Status: None (Preliminary result)   Collection Time: 03/08/17  6:36 PM  Result Value Ref Range Status   Specimen Description BLOOD LEFT ARM  Final   Special Requests  Final    BOTTLES DRAWN AEROBIC AND ANAEROBIC Blood Culture adequate volume   Culture NO GROWTH 4 DAYS  Final   Report Status PENDING  Incomplete  Surgical pcr screen     Status: None   Collection Time: 03/11/17  2:21 AM  Result Value Ref Range Status   MRSA, PCR NEGATIVE NEGATIVE Final   Staphylococcus aureus NEGATIVE NEGATIVE Final    Comment:        The Xpert SA Assay (FDA approved for NASAL specimens in patients over 65 years of age), is one component of a comprehensive surveillance program.  Test performance has been validated by [facility] for patients greater than or equal to 32 year old. It is not intended to diagnose infection nor to guide or monitor treatment.   Aerobic/Anaerobic Culture (surgical/deep wound)     Status: None (Preliminary result)   Collection Time: 03/11/17 10:53 AM  Result Value Ref Range Status   Specimen Description ABSCESS LEFT THIGH  Final   Special Requests NONE  Final   Gram Stain   Final    FEW WBC PRESENT, PREDOMINANTLY PMN FEW GRAM POSITIVE COCCI IN PAIRS RARE GRAM NEGATIVE RODS RARE YEAST    Culture CULTURE REINCUBATED FOR BETTER GROWTH  Final   Report Status PENDING  Incomplete     Labs: BNP (last 3 results) No results for input(s): BNP in the last 8760 hours. Basic Metabolic Panel:  Recent Labs Lab 03/09/17 0945 03/10/17 0502 03/10/17 1245 03/11/17 0703 03/11/17 0932 03/11/17 1648 03/12/17 1251 03/13/17 0614   NA 133* 134*  --  139  --   --  136 134*  K 3.4* 2.9* 3.0* 2.5*  --  3.1* 3.3* 3.5  CL 110 110  --  108  --   --  107 103  CO2 13* 20*  --  22  --   --  24 25  GLUCOSE 198* 207*  --  145*  --   --  113* 265*  BUN <5* <5*  --  <5*  --   --  <5* <5*  CREATININE 0.62 0.58  --  0.62  --   --  0.55 0.59  CALCIUM 8.8* 8.5*  --  8.3*  --   --  8.3* 8.1*  MG  --   --   --   --  1.5*  --  1.7  --    Liver Function Tests:  Recent Labs Lab 03/08/17 1309  AST 16  ALT 12*  ALKPHOS 303*  BILITOT 1.4*  PROT 9.0*  ALBUMIN 3.7   No results for input(s): LIPASE, AMYLASE in the last 168 hours. No results for input(s): AMMONIA in the last 168 hours. CBC:  Recent Labs Lab 03/08/17 1835 03/09/17 0945 03/10/17 0502 03/11/17 0637 03/12/17 1251  WBC 18.3* 11.1* 9.2 7.6 7.3  HGB 13.8 12.8 10.8* 10.7* 10.2*  HCT 43.0 38.3 33.2* 32.7* 31.8*  MCV 80.4 76.0* 76.1* 75.9* 77.0*  PLT 312 288 294 313 318   Cardiac Enzymes: No results for input(s): CKTOTAL, CKMB, CKMBINDEX, TROPONINI in the last 168 hours. BNP: Invalid input(s): POCBNP CBG:  Recent Labs Lab 03/12/17 1125 03/12/17 1627 03/12/17 1703 03/12/17 2116 03/13/17 0800  GLUCAP 119* 69 87 84 244*   D-Dimer No results for input(s): DDIMER in the last 72 hours. Hgb A1c No results for input(s): HGBA1C in the last 72 hours. Lipid Profile No results for input(s): CHOL, HDL, LDLCALC, TRIG, CHOLHDL, LDLDIRECT in the last 72 hours. Thyroid function  studies No results for input(s): TSH, T4TOTAL, T3FREE, THYROIDAB in the last 72 hours.  Invalid input(s): FREET3 Anemia work up No results for input(s): VITAMINB12, FOLATE, FERRITIN, TIBC, IRON, RETICCTPCT in the last 72 hours. Urinalysis    Component Value Date/Time   COLORURINE STRAW (A) 03/08/2017 1327   APPEARANCEUR HAZY (A) 03/08/2017 1327   LABSPEC 1.017 03/08/2017 1327   PHURINE 5.0 03/08/2017 1327   GLUCOSEU >=500 (A) 03/08/2017 1327   HGBUR SMALL (A) 03/08/2017 1327    BILIRUBINUR NEGATIVE 03/08/2017 1327   KETONESUR 80 (A) 03/08/2017 1327   PROTEINUR 100 (A) 03/08/2017 1327   UROBILINOGEN 0.2 02/20/2015 1737   NITRITE NEGATIVE 03/08/2017 1327   LEUKOCYTESUR NEGATIVE 03/08/2017 1327   Sepsis Labs Invalid input(s): PROCALCITONIN,  WBC,  LACTICIDVEN Microbiology Recent Results (from the past 240 hour(s))  Urine culture     Status: Abnormal   Collection Time: 03/08/17  1:27 PM  Result Value Ref Range Status   Specimen Description URINE, RANDOM  Final   Special Requests NONE  Final   Culture MULTIPLE SPECIES PRESENT, SUGGEST RECOLLECTION (A)  Final   Report Status 03/09/2017 FINAL  Final  MRSA PCR Screening     Status: None   Collection Time: 03/08/17  6:23 PM  Result Value Ref Range Status   MRSA by PCR NEGATIVE NEGATIVE Final    Comment:        The GeneXpert MRSA Assay (FDA approved for NASAL specimens only), is one component of a comprehensive MRSA colonization surveillance program. It is not intended to diagnose MRSA infection nor to guide or monitor treatment for MRSA infections.   Culture, blood (routine x 2)     Status: None (Preliminary result)   Collection Time: 03/08/17  6:30 PM  Result Value Ref Range Status   Specimen Description BLOOD LEFT HAND  Final   Special Requests   Final    BOTTLES DRAWN AEROBIC AND ANAEROBIC Blood Culture adequate volume   Culture NO GROWTH 4 DAYS  Final   Report Status PENDING  Incomplete  Culture, blood (routine x 2)     Status: None (Preliminary result)   Collection Time: 03/08/17  6:36 PM  Result Value Ref Range Status   Specimen Description BLOOD LEFT ARM  Final   Special Requests   Final    BOTTLES DRAWN AEROBIC AND ANAEROBIC Blood Culture adequate volume   Culture NO GROWTH 4 DAYS  Final   Report Status PENDING  Incomplete  Surgical pcr screen     Status: None   Collection Time: 03/11/17  2:21 AM  Result Value Ref Range Status   MRSA, PCR NEGATIVE NEGATIVE Final   Staphylococcus aureus  NEGATIVE NEGATIVE Final    Comment:        The Xpert SA Assay (FDA approved for NASAL specimens in patients over 37 years of age), is one component of a comprehensive surveillance program.  Test performance has been validated by [facility] for patients greater than or equal to 67 year old. It is not intended to diagnose infection nor to guide or monitor treatment.   Aerobic/Anaerobic Culture (surgical/deep wound)     Status: None (Preliminary result)   Collection Time: 03/11/17 10:53 AM  Result Value Ref Range Status   Specimen Description ABSCESS LEFT THIGH  Final   Special Requests NONE  Final   Gram Stain   Final    FEW WBC PRESENT, PREDOMINANTLY PMN FEW GRAM POSITIVE COCCI IN PAIRS RARE GRAM NEGATIVE RODS RARE YEAST  Culture CULTURE REINCUBATED FOR BETTER GROWTH  Final   Report Status PENDING  Incomplete     Time coordinating discharge: Over 30 minutes  SIGNED:   Debbe Odea, MD  [facility] 03/13/2017, 10:52 AM Pager   If 7PM-7AM, please contact night-coverage [email or web address] Password [email or web address]

## 2017-03-16 LAB — AEROBIC/ANAEROBIC CULTURE W GRAM STAIN (SURGICAL/DEEP WOUND)

## 2017-03-16 LAB — AEROBIC/ANAEROBIC CULTURE (SURGICAL/DEEP WOUND)

## 2017-08-08 ENCOUNTER — Encounter (HOSPITAL_COMMUNITY): Payer: Self-pay | Admitting: *Deleted

## 2017-08-08 ENCOUNTER — Inpatient Hospital Stay (HOSPITAL_COMMUNITY)
Admission: EM | Admit: 2017-08-08 | Discharge: 2017-08-16 | DRG: 637 | Disposition: E | Payer: Medicare Other | Attending: Internal Medicine | Admitting: Internal Medicine

## 2017-08-08 ENCOUNTER — Emergency Department (HOSPITAL_COMMUNITY): Payer: Medicare Other

## 2017-08-08 DIAGNOSIS — E1010 Type 1 diabetes mellitus with ketoacidosis without coma: Secondary | ICD-10-CM | POA: Diagnosis present

## 2017-08-08 DIAGNOSIS — G9341 Metabolic encephalopathy: Secondary | ICD-10-CM

## 2017-08-08 DIAGNOSIS — N183 Chronic kidney disease, stage 3 (moderate): Secondary | ICD-10-CM | POA: Diagnosis present

## 2017-08-08 DIAGNOSIS — Z79899 Other long term (current) drug therapy: Secondary | ICD-10-CM

## 2017-08-08 DIAGNOSIS — N179 Acute kidney failure, unspecified: Secondary | ICD-10-CM | POA: Diagnosis not present

## 2017-08-08 DIAGNOSIS — E1101 Type 2 diabetes mellitus with hyperosmolarity with coma: Secondary | ICD-10-CM | POA: Diagnosis not present

## 2017-08-08 DIAGNOSIS — E878 Other disorders of electrolyte and fluid balance, not elsewhere classified: Secondary | ICD-10-CM | POA: Diagnosis present

## 2017-08-08 DIAGNOSIS — R570 Cardiogenic shock: Secondary | ICD-10-CM | POA: Diagnosis not present

## 2017-08-08 DIAGNOSIS — A419 Sepsis, unspecified organism: Secondary | ICD-10-CM | POA: Diagnosis not present

## 2017-08-08 DIAGNOSIS — Z978 Presence of other specified devices: Secondary | ICD-10-CM

## 2017-08-08 DIAGNOSIS — I5082 Biventricular heart failure: Secondary | ICD-10-CM | POA: Diagnosis present

## 2017-08-08 DIAGNOSIS — R6521 Severe sepsis with septic shock: Secondary | ICD-10-CM

## 2017-08-08 DIAGNOSIS — Z66 Do not resuscitate: Secondary | ICD-10-CM | POA: Diagnosis present

## 2017-08-08 DIAGNOSIS — J9601 Acute respiratory failure with hypoxia: Secondary | ICD-10-CM

## 2017-08-08 DIAGNOSIS — E1022 Type 1 diabetes mellitus with diabetic chronic kidney disease: Secondary | ICD-10-CM | POA: Diagnosis present

## 2017-08-08 DIAGNOSIS — Z91199 Patient's noncompliance with other medical treatment and regimen due to unspecified reason: Secondary | ICD-10-CM

## 2017-08-08 DIAGNOSIS — I130 Hypertensive heart and chronic kidney disease with heart failure and stage 1 through stage 4 chronic kidney disease, or unspecified chronic kidney disease: Secondary | ICD-10-CM | POA: Diagnosis present

## 2017-08-08 DIAGNOSIS — E871 Hypo-osmolality and hyponatremia: Secondary | ICD-10-CM | POA: Diagnosis present

## 2017-08-08 DIAGNOSIS — K7200 Acute and subacute hepatic failure without coma: Secondary | ICD-10-CM | POA: Diagnosis present

## 2017-08-08 DIAGNOSIS — Z794 Long term (current) use of insulin: Secondary | ICD-10-CM

## 2017-08-08 DIAGNOSIS — E876 Hypokalemia: Secondary | ICD-10-CM | POA: Diagnosis present

## 2017-08-08 DIAGNOSIS — Z9119 Patient's noncompliance with other medical treatment and regimen: Secondary | ICD-10-CM

## 2017-08-08 DIAGNOSIS — N170 Acute kidney failure with tubular necrosis: Secondary | ICD-10-CM | POA: Diagnosis present

## 2017-08-08 DIAGNOSIS — F17210 Nicotine dependence, cigarettes, uncomplicated: Secondary | ICD-10-CM | POA: Diagnosis present

## 2017-08-08 DIAGNOSIS — I361 Nonrheumatic tricuspid (valve) insufficiency: Secondary | ICD-10-CM | POA: Diagnosis not present

## 2017-08-08 DIAGNOSIS — J9600 Acute respiratory failure, unspecified whether with hypoxia or hypercapnia: Secondary | ICD-10-CM

## 2017-08-08 DIAGNOSIS — Z452 Encounter for adjustment and management of vascular access device: Secondary | ICD-10-CM

## 2017-08-08 LAB — BLOOD GAS, VENOUS
Acid-base deficit: 8.9 mmol/L — ABNORMAL HIGH (ref 0.0–2.0)
Bicarbonate: 16.2 mmol/L — ABNORMAL LOW (ref 20.0–28.0)
O2 Saturation: 28.4 %
Patient temperature: 98.6
pCO2, Ven: 34 mmHg — ABNORMAL LOW (ref 44.0–60.0)
pH, Ven: 7.299 (ref 7.250–7.430)

## 2017-08-08 LAB — CBC WITH DIFFERENTIAL/PLATELET
Basophils Absolute: 0 10*3/uL (ref 0.0–0.1)
Basophils Relative: 0 %
EOS ABS: 0 10*3/uL (ref 0.0–0.7)
EOS PCT: 0 %
HCT: 38.7 % (ref 36.0–46.0)
Hemoglobin: 13.5 g/dL (ref 12.0–15.0)
Lymphocytes Relative: 7 %
Lymphs Abs: 0.9 10*3/uL (ref 0.7–4.0)
MCH: 26.5 pg (ref 26.0–34.0)
MCHC: 36.4 g/dL — AB (ref 30.0–36.0)
MCV: 72.1 fL — ABNORMAL LOW (ref 78.0–100.0)
Monocytes Absolute: 0.8 10*3/uL (ref 0.1–1.0)
Monocytes Relative: 6 %
NEUTROS ABS: 12 10*3/uL — AB (ref 1.7–7.7)
NEUTROS PCT: 88 %
PLATELETS: 289 10*3/uL (ref 150–400)
RBC: 5.1 MIL/uL (ref 3.87–5.11)
WBC: 13.7 10*3/uL — ABNORMAL HIGH (ref 4.0–10.5)

## 2017-08-08 LAB — URINALYSIS, ROUTINE W REFLEX MICROSCOPIC
Bilirubin Urine: NEGATIVE
Ketones, ur: 5 mg/dL — AB
Leukocytes, UA: NEGATIVE
NITRITE: NEGATIVE
Protein, ur: NEGATIVE mg/dL
SPECIFIC GRAVITY, URINE: 1.018 (ref 1.005–1.030)
pH: 5 (ref 5.0–8.0)

## 2017-08-08 LAB — COMPREHENSIVE METABOLIC PANEL
ALT: 101 U/L — ABNORMAL HIGH (ref 14–54)
AST: 105 U/L — ABNORMAL HIGH (ref 15–41)
Albumin: 3.8 g/dL (ref 3.5–5.0)
Alkaline Phosphatase: 182 U/L — ABNORMAL HIGH (ref 38–126)
BUN: 72 mg/dL — ABNORMAL HIGH (ref 6–20)
CALCIUM: 8.6 mg/dL — AB (ref 8.9–10.3)
CO2: 15 mmol/L — AB (ref 22–32)
Creatinine, Ser: 6.35 mg/dL — ABNORMAL HIGH (ref 0.44–1.00)
GFR calc non Af Amer: 7 mL/min — ABNORMAL LOW (ref >60)
GFR, EST AFRICAN AMERICAN: 8 mL/min — AB (ref >60)
Glucose, Bld: 1349 mg/dL (ref 65–99)
Potassium: 4.9 mmol/L (ref 3.5–5.1)
SODIUM: 114 mmol/L — AB (ref 135–145)
Total Bilirubin: 1.8 mg/dL — ABNORMAL HIGH (ref 0.3–1.2)
Total Protein: 8.3 g/dL — ABNORMAL HIGH (ref 6.5–8.1)

## 2017-08-08 LAB — I-STAT BETA HCG BLOOD, ED (MC, WL, AP ONLY)
HCG, QUANTITATIVE: 8.8 m[IU]/mL — AB (ref <5)
HCG, QUANTITATIVE: 7.5 m[IU]/mL — AB (ref <5)

## 2017-08-08 LAB — CBG MONITORING, ED
Glucose-Capillary: >600 mg/dL (ref 65–99)
Glucose-Capillary: >600 mg/dL (ref 65–99)

## 2017-08-08 MED ORDER — DEXTROSE-NACL 5-0.45 % IV SOLN: INTRAVENOUS | Status: DC

## 2017-08-08 MED ORDER — LACTATED RINGERS IV BOLUS (SEPSIS)
2000.0000 mL | Freq: Once | INTRAVENOUS | Status: AC
  Administered 2017-08-08: 2000 mL via INTRAVENOUS

## 2017-08-08 MED ORDER — POTASSIUM CHLORIDE 10 MEQ/100ML IV SOLN
10.0000 meq | INTRAVENOUS | Status: AC
  Administered 2017-08-09: 10 meq via INTRAVENOUS

## 2017-08-08 MED ORDER — SODIUM CHLORIDE 0.9 % IV BOLUS (SEPSIS)
1000.0000 mL | Freq: Once | INTRAVENOUS | Status: AC
  Administered 2017-08-09: 1000 mL via INTRAVENOUS

## 2017-08-08 MED ORDER — SODIUM CHLORIDE 0.9 % IV BOLUS (SEPSIS)
1000.0000 mL | Freq: Once | INTRAVENOUS | Status: AC
  Administered 2017-08-08: 1000 mL via INTRAVENOUS

## 2017-08-08 MED ORDER — SODIUM CHLORIDE 0.9 % IV SOLN
INTRAVENOUS | Status: DC
  Administered 2017-08-08: 21:00:00 via INTRAVENOUS
  Filled 2017-08-08: qty 1

## 2017-08-08 MED ORDER — ENOXAPARIN SODIUM 30 MG/0.3ML ~~LOC~~ SOLN
30.0000 mg | Freq: Every day | SUBCUTANEOUS | Status: DC
  Administered 2017-08-09: 30 mg via SUBCUTANEOUS
  Filled 2017-08-08: qty 0.3

## 2017-08-08 MED ORDER — SODIUM CHLORIDE 0.9 % IV SOLN
INTRAVENOUS | Status: DC
  Administered 2017-08-09: 11.5 [IU]/h via INTRAVENOUS
  Administered 2017-08-09: 10.8 [IU]/h via INTRAVENOUS
  Filled 2017-08-08 (×3): qty 1

## 2017-08-08 MED ORDER — METOCLOPRAMIDE HCL 5 MG/ML IJ SOLN
10.0000 mg | Freq: Once | INTRAMUSCULAR | Status: AC
  Administered 2017-08-08: 10 mg via INTRAVENOUS
  Filled 2017-08-08: qty 2

## 2017-08-08 MED ORDER — SODIUM CHLORIDE 0.9 % IV SOLN
INTRAVENOUS | Status: DC
  Administered 2017-08-08: 21:00:00 via INTRAVENOUS

## 2017-08-08 MED ORDER — POTASSIUM CHLORIDE 10 MEQ/100ML IV SOLN
10.0000 meq | INTRAVENOUS | Status: AC
  Administered 2017-08-08 – 2017-08-09 (×2): 10 meq via INTRAVENOUS
  Filled 2017-08-08 (×3): qty 100

## 2017-08-08 MED ORDER — SODIUM CHLORIDE 0.9 % IV SOLN
INTRAVENOUS | Status: AC
  Administered 2017-08-09: 04:00:00 via INTRAVENOUS

## 2017-08-08 MED ORDER — SODIUM CHLORIDE 0.9 % IV SOLN
INTRAVENOUS | Status: DC
  Administered 2017-08-09: 03:00:00 via INTRAVENOUS

## 2017-08-08 NOTE — ED Notes (Signed)
ED TO INPATIENT HANDOFF REPORT  Name/Age/Gender Stephanie Huber 51 y.o. female  Code Status    Code Status Orders  (From admission, onward)        Start     Ordered   07/31/2017 2123  Full code  Continuous     07/22/2017 2127    Code Status History    Date Active Date Inactive Code Status Order ID Comments User Context   03/08/2017 18:00 03/14/2017 00:10 Full Code [id number]  Mendel Corning, MD Inpatient   11/09/2015 22:02 11/12/2015 18:23 Full Code [id number]  Ivor Costa, MD ED   02/18/2015 14:49 02/21/2015 14:59 Full Code [id number]  Mendel Corning, MD ED      Home/SNF/Other Home  Chief Complaint High Blood Sugar / Altered Mental Status   Level of Care/Admitting Diagnosis ED Disposition    ED Disposition Condition Comment   Admit  Hospital Area: [facility] [[id number]]  Level of Care: ICU [6]  Diagnosis: Non-ketotic hyperosmolar coma [facility]) [[id number]]  Admitting Physician: Aldean Jewett [[id number]]  Attending Physician: Aldean Jewett [phone]  Estimated length of stay: 3 - 4 days  Certification:: I certify this patient will need inpatient services for at least 2 midnights  PT Class (Do Not Modify): Inpatient [101]  PT Acc Code (Do Not Modify): Private [1]       Medical History Past Medical History:  Diagnosis Date  . CKD (chronic kidney disease), stage III ([place])   . Essential hypertension   . Type 1 diabetes mellitus ([place])     Allergies No Known Allergies  IV Location/Drains/Wounds Patient Lines/Drains/Airways Status   Active Line/Drains/Airways    Name:   Placement date:   Placement time:   Site:   Days:   Peripheral IV 08/02/2017 Left;Medial Antecubital   08/01/2017    1708    Antecubital   less than 1   Incision (Closed) 03/11/17 Leg Left   03/11/17    1144     150          Labs/Imaging Results for orders placed or performed during the hospital encounter of 07/29/2017 (from the past 48 hour(s))  CBG monitoring, ED     Status: Abnormal   Collection Time: 08/07/2017  5:25 PM  Result Value Ref Range   Glucose-Capillary >600 (HH) 65 - 99 mg/dL  I-Stat beta hCG blood, ED     Status: Abnormal   Collection Time: 08/04/2017  5:31 PM  Result Value Ref Range   I-stat hCG, quantitative 8.8 (H) <5 mIU/mL   Comment 3            Comment:   GEST. AGE      CONC.  (mIU/mL)   <=1 WEEK        5 - 50     2 WEEKS       50 - 500     3 WEEKS       100 - 10,000     4 WEEKS     1,000 - 30,000        FEMALE AND NON-PREGNANT FEMALE:     LESS THAN 5 mIU/mL   Blood gas, venous     Status: Abnormal   Collection Time: 08/05/2017  5:40 PM  Result Value Ref Range   pH, Ven 7.299 7.250 - 7.430   pCO2, Ven 34.0 (L) 44.0 - 60.0 mmHg   pO2, Ven BELOW REPORTABLE RANGE 32.0 - 45.0 mmHg    Comment: CRITICAL RESULT CALLED  TO, READ BACK BY AND VERIFIED WITH: J.KNAPP,MD AT 1743 BY M.JESTER, RRT,RCP ON 07/24/2017    Bicarbonate 16.2 (L) 20.0 - 28.0 mmol/L   Acid-base deficit 8.9 (H) 0.0 - 2.0 mmol/L   O2 Saturation 28.4 %   Patient temperature 98.6    Collection site VENOUS    Drawn by DRAWN BY RN    Sample type VENOUS   I-Stat beta hCG blood, ED ([facility], [facility], [facility] only)     Status: Abnormal   Collection Time: 07/31/2017  5:50 PM  Result Value Ref Range   I-stat hCG, quantitative 7.5 (H) <5 mIU/mL   Comment 3            Comment:   GEST. AGE      CONC.  (mIU/mL)   <=1 WEEK        5 - 50     2 WEEKS       50 - 500     3 WEEKS       100 - 10,000     4 WEEKS     1,000 - 30,000        FEMALE AND NON-PREGNANT FEMALE:     LESS THAN 5 mIU/mL   CBC with Differential/Platelet     Status: Abnormal   Collection Time: 07/26/2017  6:30 PM  Result Value Ref Range   WBC 13.7 (H) 4.0 - 10.5 K/uL   RBC 5.10 3.87 - 5.11 MIL/uL   Hemoglobin 13.5 12.0 - 15.0 g/dL   HCT 38.7 36.0 - 46.0 %   MCV 72.1 (L) 78.0 - 100.0 fL   MCH 26.5 26.0 - 34.0 pg   MCHC 36.4 (H) 30.0 - 36.0 g/dL   RDW NOT CALCULATED 11.5 - 15.5 %   Platelets 289 150 - 400 K/uL   Neutrophils Relative % 88 %    Neutro Abs 12.0 (H) 1.7 - 7.7 K/uL   Lymphocytes Relative 7 %   Lymphs Abs 0.9 0.7 - 4.0 K/uL   Monocytes Relative 6 %   Monocytes Absolute 0.8 0.1 - 1.0 K/uL   Eosinophils Relative 0 %   Eosinophils Absolute 0.0 0.0 - 0.7 K/uL   Basophils Relative 0 %   Basophils Absolute 0.0 0.0 - 0.1 K/uL  Comprehensive metabolic panel     Status: Abnormal   Collection Time: 07/23/2017  6:30 PM  Result Value Ref Range   Sodium 114 (LL) 135 - 145 mmol/L    Comment: CRITICAL RESULT CALLED TO, READ BACK BY AND VERIFIED WITH: GARIFO,A RN [phone] [place],N RESULTS VERIFIED VIA RECOLLECT    Potassium 4.9 3.5 - 5.1 mmol/L   Chloride <65 (LL) 101 - 111 mmol/L    Comment: CRITICAL RESULT CALLED TO, READ BACK BY AND VERIFIED WITH: GARIFO,A RN 1938 [phone] [place],N RESULTS VERIFIED VIA RECOLLECT    CO2 15 (L) 22 - 32 mmol/L   Glucose, Bld 1,349 (HH) 65 - 99 mg/dL    Comment: CRITICAL RESULT CALLED TO, READ BACK BY AND VERIFIED WITH: GARIFO,A RN 1938 [phone] [place],N RESULTS VERIFIED VIA RECOLLECT RESULTS CONFIRMED BY MANUAL DILUTION    BUN 72 (H) 6 - 20 mg/dL   Creatinine, Ser 6.35 (H) 0.44 - 1.00 mg/dL   Calcium 8.6 (L) 8.9 - 10.3 mg/dL   Total Protein 8.3 (H) 6.5 - 8.1 g/dL   Albumin 3.8 3.5 - 5.0 g/dL   AST 105 (H) 15 - 41 U/L   ALT 101 (H) 14 - 54 U/L   Alkaline Phosphatase  182 (H) 38 - 126 U/L   Total Bilirubin 1.8 (H) 0.3 - 1.2 mg/dL   GFR calc non Af Amer 7 (L) >60 mL/min   GFR calc Af Amer 8 (L) >60 mL/min    Comment: (NOTE) The eGFR has been calculated using the CKD EPI equation. This calculation has not been validated in all clinical situations. eGFR's persistently <60 mL/min signify possible Chronic Kidney Disease.    Anion gap NOT CALCULATED 5 - 15  CBG monitoring, ED     Status: Abnormal   Collection Time: 07/17/2017  9:53 PM  Result Value Ref Range   Glucose-Capillary >600 (HH) 65 - 99 mg/dL   Dg Chest Portable 1 View  Result Date: 07/25/2017 CLINICAL DATA:   Weakness.  Altered mental status. EXAM: PORTABLE CHEST 1 VIEW COMPARISON:  Radiograph 03/08/2017 FINDINGS: Mild rotation and anti lordotic positioning. The cardiomediastinal contours are normal for technique. The lungs are clear. Pulmonary vasculature is normal. No consolidation, pleural effusion, or pneumothorax. No acute osseous abnormalities are seen. IMPRESSION: No acute abnormality. Electronically Signed   By: Jeb Levering M.D.   On: 07/28/2017 18:06    Pending Labs Unresulted Labs (From admission, onward)   Start     Ordered   08/15/17 0500  Creatinine, serum  (enoxaparin (LOVENOX)    CrCl < 30 ml/min)  Weekly,   R    Comments:  while on enoxaparin therapy.    07/23/2017 2128   07/23/2017 9983  Basic metabolic panel  STAT Now then every 4 hours ,   STAT     08/10/2017 2127   07/26/2017 2123  CBC  (enoxaparin (LOVENOX)    CrCl < 30 ml/min)  Once,   R    Comments:  Baseline for enoxaparin therapy IF NOT ALREADY DRAWN.  Notify MD if PLT < 100 K.    08/05/2017 2128   07/26/2017 3825  Basic metabolic panel  Once,   STAT     08/03/2017 2059   08/06/2017 1725  CBC with Differential (PNL)  STAT,   STAT     08/06/2017 1726   08/07/2017 1725  Urinalysis, Routine w reflex microscopic  STAT,   STAT     08/02/2017 1726      Vitals/Pain Today's Vitals   08/12/2017 1830 07/18/2017 1900 08/13/2017 1934 07/27/2017 2205  BP: 103/69 104/71 92/65 105/84  Pulse: 92 91 99 (!) 104  Resp: 20 20 (!) 23   Temp:      TempSrc:      SpO2: 99% 99% 99% 100%  Weight:      Height:        Isolation Precautions No active isolations  Medications Medications  sodium chloride 0.9 % bolus 1,000 mL (0 mLs Intravenous Stopped 08/09/2017 1845)    And  sodium chloride 0.9 % bolus 1,000 mL (0 mLs Intravenous Stopped 08/01/2017 1936)    And  0.9 %  sodium chloride infusion ( Intravenous New Bag/Given 07/23/2017 2058)  potassium chloride 10 mEq in 100 mL IVPB (0 mEq Intravenous Stopped 08/07/2017 2033)  sodium chloride 0.9 % bolus 1,000 mL  (not administered)  0.9 %  sodium chloride infusion (not administered)  0.9 %  sodium chloride infusion (not administered)  dextrose 5 %-0.45 % sodium chloride infusion (not administered)  insulin regular (NOVOLIN R,HUMULIN R) 100 Units in sodium chloride 0.9 % 100 mL (1 Units/mL) infusion (10.8 Units/hr Intravenous Rate/Dose Change 07/26/2017 2205)  enoxaparin (LOVENOX) injection 30 mg (not administered)  potassium chloride 10 mEq  in 100 mL IVPB (not administered)  lactated ringers bolus 2,000 mL (not administered)  metoCLOPramide (REGLAN) injection 10 mg (10 mg Intravenous Given 08/12/2017 1751)    Mobility walks

## 2017-08-08 NOTE — ED Notes (Signed)
Pt hard stick consulting IV team

## 2017-08-08 NOTE — ED Provider Notes (Signed)
[facility] DEPT Provider Note   CSN: [id number] Arrival date & time: 07/22/2017  1706     History   Chief Complaint Chief Complaint  Patient presents with  . Hyperglycemia  . Altered Mental Status    HPI Stephanie Huber is a 51 y.o. female.  HPI Patient presents to the emergency room for evaluation of altered mental status.  Patient has a history of diabetes.  According to the EMS report the patient had an elevated blood sugar at home today.  Patient is confused and is unable to answer my questions consistently.  EMS reported that she was confused today.  It is unclear when this exactly started.  Also unclear when her blood sugar became elevated.  According to our medical records the patient has been admitted to the hospital for DKA in the past.  She was admitted both in February 2017 and June 2018 for DKA. Past Medical History:  Diagnosis Date  . CKD (chronic kidney disease), stage III ([place])   . Essential hypertension   . Type 1 diabetes mellitus [facility])     Patient Active Problem List   Diagnosis Date Noted  . DM (diabetes mellitus), type 1, uncontrolled ([place]) 03/13/2017  . Malnutrition of moderate degree 03/11/2017  . Abscess of left thigh 03/09/2017  . Leukocytosis 03/08/2017  . Acute kidney injury ([place]) 11/10/2015  . Hypothermia 11/09/2015  . Acute encephalopathy 11/09/2015  . SIRS (systemic inflammatory response syndrome) ([place]) 11/09/2015  . CKD (chronic kidney disease), stage III ([place])   . Type 1 diabetes mellitus with stage 3 chronic kidney disease ([place])   . Essential hypertension   . DKA (diabetic ketoacidoses) ([place]) 02/18/2015  . Metabolic acidosis 88/32/5498    Past Surgical History:  Procedure Laterality Date  . INCISION AND DRAINAGE ABSCESS Left 03/11/2017   Procedure: INCISION AND DRAINAGE LEFT THIGH ABSCESS;  Surgeon: Rolm Bookbinder, MD;  Location: [facility];  Service: General;  Laterality: Left;    OB History    No data  available       Home Medications    Prior to Admission medications   Medication Sig Start Date End Date Taking? Authorizing Provider  amoxicillin-clavulanate (AUGMENTIN) 875-125 MG tablet Take 1 tablet by mouth 2 (two) times daily. 03/13/17   Debbe Odea, MD  feeding supplement, GLUCERNA SHAKE, (GLUCERNA SHAKE) LIQD Take 237 mLs by mouth 3 (three) times daily between meals. 03/13/17   Debbe Odea, MD  glucose monitoring kit (FREESTYLE) monitoring kit 1 each by Does not apply route as needed for other. Dispense any model that is covered- dispense testing supplies for Q AC/ HS accuchecks- 1 month supply with one refil. 03/13/17   Debbe Odea, MD  HUMALOG 100 UNIT/ML injection INJECT 2-10UNITS UNDER SKIN 3 TIMES A DAY BEFORE MEALS PER SLIDING SCALE AS DIRECTED. 05/09/17   [provider]  insulin aspart (NOVOLOG) 100 UNIT/ML injection Inject 0-9 Units into the skin 3 (three) times daily with meals. CBG 70 - 120: 0 units  CBG 121 - 150: 1 unit  CBG 151 - 200: 2 units  CBG 201 - 250: 3 units  CBG 251 - 300: 5 units  CBG 301 - 350: 7 units  CBG 351 - 400 9 units   Use this scale until your appetite improves. Then go back to using 52m of Novolog with each meal. 03/13/17   RDebbe Odea MD  insulin glargine (LANTUS) 100 UNIT/ML injection Inject 0.25 mLs (25 Units total) into the skin daily.  Patient taking differently: Inject 30 Units into the skin daily before breakfast.  02/21/15   Rai, Ripudeep K, MD  INSULIN SYRINGE .5CC/28G 28G X 1/2" 0.5 ML MISC 1 each by Does not apply route 4 (four) times daily -  before meals and at bedtime. 03/13/17   Debbe Odea, MD  Insulin Syringes, Disposable, U-100 1 ML MISC Use as directed 02/21/15   Rai, Vernelle Emerald, MD  lisinopril-hydrochlorothiazide (PRINZIDE,ZESTORETIC) 20-25 MG tablet Take 1 tablet by mouth daily. 07/04/17   [provider]  traMADol (ULTRAM) 50 MG tablet Take 1-2 tablets (50-100 mg total) by mouth every 6 (six) hours as needed  for moderate pain (use 50 mg for mild pain and 100 mg for moderate pain). 03/13/17   Debbe Odea, MD    Family History Family History  Problem Relation Age of Onset  . Dementia Unknown     Social History Social History   Tobacco Use  . Smoking status: Current Some Day Smoker  . Smokeless tobacco: Never Used  Substance Use Topics  . Alcohol use: No  . Drug use: No     Allergies   Patient has no known allergies.   Review of Systems Review of Systems  Gastrointestinal: Positive for nausea and vomiting.  All other systems reviewed and are negative.    Physical Exam Updated Vital Signs BP 92/65 (BP Location: Left Arm)   Pulse 99   Temp (!) 97.4 F (36.3 C) (Oral)   Resp (!) 23   Ht 1.651 m (_0 )   Wt 58.5 kg (129 lb)   SpO2 99%   BMI 21.47 kg/m   Physical Exam  Constitutional: She appears listless. No distress.  Underweight  HENT:  Head: Normocephalic and atraumatic.  Right Ear: External ear normal.  Left Ear: External ear normal.  Mucous membranes are dry  Eyes: Conjunctivae are normal. Right eye exhibits no discharge. Left eye exhibits no discharge. No scleral icterus.  Neck: Neck supple. No tracheal deviation present.  Cardiovascular: Normal rate, regular rhythm and intact distal pulses.  Pulmonary/Chest: Effort normal and breath sounds normal. No stridor. No respiratory distress. She has no wheezes. She has no rales.  Abdominal: Soft. Bowel sounds are normal. She exhibits no distension. There is tenderness in the epigastric area. There is no rebound and no guarding.  Musculoskeletal: She exhibits no edema or tenderness.  Neurological: She appears listless. No cranial nerve deficit (no facial droop, extraocular movements intact, no slurred speech) or sensory deficit. She exhibits normal muscle tone. She displays no seizure activity. Coordination normal. GCS eye subscore is 3. GCS verbal subscore is 4. GCS motor subscore is 5.  Patient will answer some  questions yes and no, she is very listless, so she has generalized weakness, she is moving her extremities  Skin: Skin is warm and dry. No rash noted. She is not diaphoretic.  Psychiatric: She has a normal mood and affect.  Nursing note and vitals reviewed.    ED Treatments / Results  Labs (all labs ordered are listed, but only abnormal results are displayed) Labs Reviewed  BLOOD GAS, VENOUS - Abnormal; Notable for the following components:      Result Value   pCO2, Ven 34.0 (*)    Bicarbonate 16.2 (*)    Acid-base deficit 8.9 (*)    All other components within normal limits  CBC WITH DIFFERENTIAL/PLATELET - Abnormal; Notable for the following components:   WBC 13.7 (*)    MCV 72.1 (*)  MCHC 36.4 (*)    Neutro Abs 12.0 (*)    All other components within normal limits  COMPREHENSIVE METABOLIC PANEL - Abnormal; Notable for the following components:   Sodium 114 (*)    Chloride <65 (*)    CO2 15 (*)    Glucose, Bld 1,349 (*)    BUN 72 (*)    Creatinine, Ser 6.35 (*)    Calcium 8.6 (*)    Total Protein 8.3 (*)    AST 105 (*)    ALT 101 (*)    Alkaline Phosphatase 182 (*)    Total Bilirubin 1.8 (*)    GFR calc non Af Amer 7 (*)    GFR calc Af Amer 8 (*)    All other components within normal limits  I-STAT BETA HCG BLOOD, ED ([facility], [facility], [facility] ONLY) - Abnormal; Notable for the following components:   I-stat hCG, quantitative 8.8 (*)    All other components within normal limits  CBG MONITORING, ED - Abnormal; Notable for the following components:   Glucose-Capillary >600 (*)    All other components within normal limits  I-STAT BETA HCG BLOOD, ED ([facility], [facility], [facility] ONLY) - Abnormal; Notable for the following components:   I-stat hCG, quantitative 7.5 (*)    All other components within normal limits  CBC WITH DIFFERENTIAL/PLATELET  URINALYSIS, ROUTINE W REFLEX MICROSCOPIC  BASIC METABOLIC PANEL  I-STAT CHEM 8, ED    EKG  EKG Interpretation  Date/Time:  Friday August 08 2017  17:50:14 EST Ventricular Rate:  92 PR Interval:    QRS Duration: 98 QT Interval:  407 QTC Calculation: 504 R Axis:   -58 Text Interpretation:  Sinus rhythm peaked t waves compared to prior tracing Confirmed by Dorie Rank [phone]) on 08/10/2017 6:47:32 PM       Radiology Dg Chest Portable 1 View  Result Date: 07/28/2017 CLINICAL DATA:  Weakness.  Altered mental status. EXAM: PORTABLE CHEST 1 VIEW COMPARISON:  Radiograph 03/08/2017 FINDINGS: Mild rotation and anti lordotic positioning. The cardiomediastinal contours are normal for technique. The lungs are clear. Pulmonary vasculature is normal. No consolidation, pleural effusion, or pneumothorax. No acute osseous abnormalities are seen. IMPRESSION: No acute abnormality. Electronically Signed   By: Jeb Levering M.D.   On: 08/10/2017 18:06    Procedures Procedures (including critical care time)  Medications Ordered in ED Medications  sodium chloride 0.9 % bolus 1,000 mL (0 mLs Intravenous Stopped 07/24/2017 1845)    And  sodium chloride 0.9 % bolus 1,000 mL (0 mLs Intravenous Stopped 08/15/2017 1936)    And  0.9 %  sodium chloride infusion ( Intravenous New Bag/Given 08/13/2017 2058)  potassium chloride 10 mEq in 100 mL IVPB (0 mEq Intravenous Stopped 07/25/2017 2033)  insulin regular (NOVOLIN R,HUMULIN R) 100 Units in sodium chloride 0.9 % 100 mL (1 Units/mL) infusion ( Intravenous New Bag/Given 08/01/2017 2053)  sodium chloride 0.9 % bolus 1,000 mL (not administered)  metoCLOPramide (REGLAN) injection 10 mg (10 mg Intravenous Given 07/19/2017 1751)     Initial Impression / Assessment and Plan / ED Course  I have reviewed the triage vital signs and the nursing notes.  Pertinent labs & imaging results that were available during my care of the patient were reviewed by me and considered in my medical decision making (see chart for details).  Clinical Course as of Aug 08 2099  Fri Aug 08, 2017  1909 Patient's i-STAT Chem-8 did not  crossover.  I reviewed the results on paper.  Potassium was 4.6  [JK]  1934 Checked on CMET.  They are diluting it.  It still is in process.    [JK]  2006 Sodium corrects to 134 katz equation/144  hiller equation  [JK]  2047 Discussed with Dr Jimmy Footman.  Will have PCCM come see her regarding admission.  [JK]  2047 Will repeat BMET.  Continue hydration  [JK]  2057 Pt reassessed.  Seems more alert.  Will answer some questions but still confused.   Will continue fluids.  Recheck a BMET  [JK]    Clinical Course User Index [JK] Dorie Rank, MD    Patient presented to the emergency room with altered mental status and hyperglycemia.  Was able to obtain additional information from family.  Patient was not sick recently.  Unfortunately she does not take her insulin regularly.  It is unclear when the last time she actually took it.  Yesterday she was sleeping most of the day.  Laboratory tests are notable for diabetic ketoacidosis.  She has profound hyponatremia hypochloremia.  Patient also has new onset acute renal failure most likely related to dehydration from her DKA.  Patient is confused however I do not think hypertonic saline is indicated at this time.  Her sodium actually corrects counting for her profound hyperglycemia  I consulted with critical care.  Plan on admission for further treatment.  Final Clinical Impressions(s) / ED Diagnoses   Final diagnoses:  Diabetic ketoacidosis without coma associated with type 1 diabetes mellitus ([place])  H/O noncompliance with medical treatment, presenting hazards to health  Hypochloremia      Dorie Rank, MD 08/09/2017 2101

## 2017-08-08 NOTE — ED Notes (Signed)
RN aware of I STAT chem 8 being unable to result

## 2017-08-08 NOTE — ED Triage Notes (Signed)
Pt arrived [facility] with complaints of high blood sugar and altered mental status from home. Pt A&O x2 pt CBG per EMS >600.

## 2017-08-08 NOTE — ED Notes (Signed)
MD AND RN NOTIFIED OF PATIENT'S CHEM 8 LEVEL OF

## 2017-08-08 NOTE — H&P (Signed)
PULMONARY / CRITICAL CARE MEDICINE   Name: Stephanie Huber MRN: [id number] DOB: [birth year]    ADMISSION DATE:  07/26/2017 CONSULTATION DATE:  08/06/2017 REFERRING MD: ED  CHIEF COMPLAINT:  Weakness   HISTORY OF PRESENT ILLNESS:   Stephanie Huber is a 51 yr old lady with DMI, CKD, HTN coming in with altered mental status and as per cousin she has not been taking her insulin and has been feeling weak and sick. No fever chills or rigors. No chest pain or focal weakness.   Patient was found to have glucose of ~2600 and started on insulin drip.   PAST MEDICAL HISTORY :  She  has a past medical history of CKD (chronic kidney disease), stage III ([place]), Essential hypertension, and Type 1 diabetes mellitus ([place]).  PAST SURGICAL HISTORY: She  has a past surgical history that includes Incision and drainage abscess (Left, 03/11/2017).  No Known Allergies  No current facility-administered medications on file prior to encounter.    Current Outpatient Medications on File Prior to Encounter  Medication Sig  . amoxicillin-clavulanate (AUGMENTIN) 875-125 MG tablet Take 1 tablet by mouth 2 (two) times daily.  . feeding supplement, GLUCERNA SHAKE, (GLUCERNA SHAKE) LIQD Take 237 mLs by mouth 3 (three) times daily between meals.  Marland Kitchen glucose monitoring kit (FREESTYLE) monitoring kit 1 each by Does not apply route as needed for other. Dispense any model that is covered- dispense testing supplies for Q AC/ HS accuchecks- 1 month supply with one refil.  Marland Kitchen HUMALOG 100 UNIT/ML injection INJECT 2-10UNITS UNDER SKIN 3 TIMES A DAY BEFORE MEALS PER SLIDING SCALE AS DIRECTED.  Marland Kitchen insulin aspart (NOVOLOG) 100 UNIT/ML injection Inject 0-9 Units into the skin 3 (three) times daily with meals. CBG 70 - 120: 0 units  CBG 121 - 150: 1 unit  CBG 151 - 200: 2 units  CBG 201 - 250: 3 units  CBG 251 - 300: 5 units  CBG 301 - 350: 7 units  CBG 351 - 400 9 units   Use this scale until your appetite improves. Then go back to  using '5mg'$  of Novolog with each meal.  . insulin glargine (LANTUS) 100 UNIT/ML injection Inject 0.25 mLs (25 Units total) into the skin daily. (Patient taking differently: Inject 30 Units into the skin daily before breakfast. )  . INSULIN SYRINGE .5CC/28G 28G X 1/2" 0.5 ML MISC 1 each by Does not apply route 4 (four) times daily -  before meals and at bedtime.  . Insulin Syringes, Disposable, U-100 1 ML MISC Use as directed  . lisinopril-hydrochlorothiazide (PRINZIDE,ZESTORETIC) 20-25 MG tablet Take 1 tablet by mouth daily.  . traMADol (ULTRAM) 50 MG tablet Take 1-2 tablets (50-100 mg total) by mouth every 6 (six) hours as needed for moderate pain (use 50 mg for mild pain and 100 mg for moderate pain).    FAMILY HISTORY:  Her indicated that the status of her unknown relative is unknown.   SOCIAL HISTORY: She  reports that she has been smoking.  she has never used smokeless tobacco. She reports that she does not drink alcohol or use drugs.  REVIEW OF SYSTEMS:   Could not be obtained   VITAL SIGNS: BP 92/65 (BP Location: Left Arm)   Pulse 99   Temp (!) 97.4 F (36.3 C) (Oral)   Resp (!) 23   Ht '5\' 5"'$  (1.651 m)   Wt 58.5 kg (129 lb)   SpO2 99%   BMI 21.47 kg/m   HEMODYNAMICS:  VENTILATOR SETTINGS:    INTAKE / OUTPUT: I/O last 3 completed shifts: In: 1000 [IV Piggyback:1000] Out: -   PHYSICAL EXAMINATION: General:  Very dry mucus membranes Neuro:  Lethargic but arousable an disoriented to place  HEENT:  Dry mucus membranes  Cardiovascular:  Normal heart sounds no murmurs Lungs:  Clear equal air sounds  Abdomen: soft no tenderness  Musculoskeletal:  No edema  Skin: dry skin  LABS:  BMET Recent Labs  Lab 07/18/2017 1830  NA 114*  K 4.9  CL <65*  CO2 15*  BUN 72*  CREATININE 6.35*  GLUCOSE 1,349*    Electrolytes Recent Labs  Lab 08/13/2017 1830  CALCIUM 8.6*    CBC Recent Labs  Lab 08/15/2017 1830  WBC 13.7*  HGB 13.5  HCT 38.7  PLT 289     Coag's No results for input(s): APTT, INR in the last 168 hours.  Sepsis Markers No results for input(s): LATICACIDVEN, PROCALCITON, O2SATVEN in the last 168 hours.  ABG No results for input(s): PHART, PCO2ART, PO2ART in the last 168 hours.  Liver Enzymes Recent Labs  Lab 08/05/2017 1830  AST 105*  ALT 101*  ALKPHOS 182*  BILITOT 1.8*  ALBUMIN 3.8    Cardiac Enzymes No results for input(s): TROPONINI, PROBNP in the last 168 hours.  Glucose Recent Labs  Lab 08/07/2017 1725  GLUCAP >600*    Imaging Dg Chest Portable 1 View  Result Date: 07/20/2017 CLINICAL DATA:  Weakness.  Altered mental status. EXAM: PORTABLE CHEST 1 VIEW COMPARISON:  Radiograph 03/08/2017 FINDINGS: Mild rotation and anti lordotic positioning. The cardiomediastinal contours are normal for technique. The lungs are clear. Pulmonary vasculature is normal. No consolidation, pleural effusion, or pneumothorax. No acute osseous abnormalities are seen. IMPRESSION: No acute abnormality. Electronically Signed   By: Jeb Levering M.D.   On: 08/07/2017 18:06       ASSESSMENT / PLAN:  - Hyperosmolar non ketotic acidosis - metabolic encephalopathy - acute kidney injury - severe hyponatremia  - acute transaminitis  - non compliance - suspected depression  - hypocalcemia  Plan: - insulin drip - IVF hydration - serial BMP - DVT and GI prophylaxis - follow sodium level. hyponatermia secondary to hyperglycemia - will need to see psychiatry, family worried about depression   FAMILY  - Updates: cousin updated bedside       [facility] [place] Pager: [phone]  07/28/2017, 9:28 PM

## 2017-08-08 NOTE — ED Notes (Signed)
Critical Sodium 114 Chloride less than 65 Glucose 1349 diluted- so true value is 2600

## 2017-08-08 NOTE — ED Notes (Signed)
Bed: [id number] Expected date:  Expected time:  Means of arrival:  Comments: 51 yo hyperglycemia

## 2017-08-09 ENCOUNTER — Encounter (HOSPITAL_COMMUNITY): Payer: Self-pay

## 2017-08-09 ENCOUNTER — Other Ambulatory Visit: Payer: Self-pay

## 2017-08-09 DIAGNOSIS — A419 Sepsis, unspecified organism: Secondary | ICD-10-CM

## 2017-08-09 DIAGNOSIS — Z91199 Patient's noncompliance with other medical treatment and regimen due to unspecified reason: Secondary | ICD-10-CM

## 2017-08-09 DIAGNOSIS — J9601 Acute respiratory failure with hypoxia: Secondary | ICD-10-CM

## 2017-08-09 DIAGNOSIS — Z9119 Patient's noncompliance with other medical treatment and regimen: Secondary | ICD-10-CM

## 2017-08-09 DIAGNOSIS — G9341 Metabolic encephalopathy: Secondary | ICD-10-CM

## 2017-08-09 DIAGNOSIS — R6521 Severe sepsis with septic shock: Secondary | ICD-10-CM

## 2017-08-09 DIAGNOSIS — E878 Other disorders of electrolyte and fluid balance, not elsewhere classified: Secondary | ICD-10-CM

## 2017-08-09 LAB — URINALYSIS, ROUTINE W REFLEX MICROSCOPIC

## 2017-08-09 LAB — BASIC METABOLIC PANEL
ANION GAP: 25 — AB (ref 5–15)
ANION GAP: 16 — AB (ref 5–15)
ANION GAP: 14 (ref 5–15)
ANION GAP: 10 (ref 5–15)
BUN: 74 mg/dL — ABNORMAL HIGH (ref 6–20)
BUN: 64 mg/dL — ABNORMAL HIGH (ref 6–20)
BUN: 57 mg/dL — ABNORMAL HIGH (ref 6–20)
BUN: 59 mg/dL — ABNORMAL HIGH (ref 6–20)
BUN: 66 mg/dL — ABNORMAL HIGH (ref 6–20)
CALCIUM: 8.3 mg/dL — AB (ref 8.9–10.3)
CALCIUM: 7.8 mg/dL — AB (ref 8.9–10.3)
CALCIUM: 6.6 mg/dL — AB (ref 8.9–10.3)
CHLORIDE: 76 mmol/L — AB (ref 101–111)
CHLORIDE: 85 mmol/L — AB (ref 101–111)
CHLORIDE: 93 mmol/L — AB (ref 101–111)
CO2: 16 mmol/L — AB (ref 22–32)
CO2: 28 mmol/L (ref 22–32)
CO2: 30 mmol/L (ref 22–32)
CO2: 22 mmol/L (ref 22–32)
CO2: 18 mmol/L — ABNORMAL LOW (ref 22–32)
CREATININE: 4.29 mg/dL — AB (ref 0.44–1.00)
Calcium: 8.4 mg/dL — ABNORMAL LOW (ref 8.9–10.3)
Calcium: 7.6 mg/dL — ABNORMAL LOW (ref 8.9–10.3)
Chloride: 98 mmol/L — ABNORMAL LOW (ref 101–111)
Creatinine, Ser: 6.21 mg/dL — ABNORMAL HIGH (ref 0.44–1.00)
Creatinine, Ser: 5.11 mg/dL — ABNORMAL HIGH (ref 0.44–1.00)
Creatinine, Ser: 4.08 mg/dL — ABNORMAL HIGH (ref 0.44–1.00)
Creatinine, Ser: 3.89 mg/dL — ABNORMAL HIGH (ref 0.44–1.00)
GFR calc Af Amer: 10 mL/min — ABNORMAL LOW (ref >60)
GFR calc Af Amer: 14 mL/min — ABNORMAL LOW (ref >60)
GFR calc non Af Amer: 7 mL/min — ABNORMAL LOW (ref >60)
GFR calc non Af Amer: 9 mL/min — ABNORMAL LOW (ref >60)
GFR calc non Af Amer: 12 mL/min — ABNORMAL LOW (ref >60)
GFR calc non Af Amer: 12 mL/min — ABNORMAL LOW (ref >60)
GFR calc non Af Amer: 11 mL/min — ABNORMAL LOW (ref >60)
GFR, EST AFRICAN AMERICAN: 8 mL/min — AB (ref >60)
GFR, EST AFRICAN AMERICAN: 14 mL/min — AB (ref >60)
GFR, EST AFRICAN AMERICAN: 13 mL/min — AB (ref >60)
GLUCOSE: 663 mg/dL — AB (ref 65–99)
GLUCOSE: 280 mg/dL — AB (ref 65–99)
GLUCOSE: 217 mg/dL — AB (ref 65–99)
Glucose, Bld: 1151 mg/dL (ref 65–99)
Glucose, Bld: 240 mg/dL — ABNORMAL HIGH (ref 65–99)
POTASSIUM: 3.3 mmol/L — AB (ref 3.5–5.1)
POTASSIUM: 3.4 mmol/L — AB (ref 3.5–5.1)
Potassium: 4.5 mmol/L (ref 3.5–5.1)
Potassium: 3.5 mmol/L (ref 3.5–5.1)
Potassium: 4.7 mmol/L (ref 3.5–5.1)
Sodium: 118 mmol/L — CL (ref 135–145)
Sodium: 129 mmol/L — ABNORMAL LOW (ref 135–145)
Sodium: 131 mmol/L — ABNORMAL LOW (ref 135–145)
Sodium: 129 mmol/L — ABNORMAL LOW (ref 135–145)
Sodium: 126 mmol/L — ABNORMAL LOW (ref 135–145)

## 2017-08-09 LAB — GLUCOSE, CAPILLARY
GLUCOSE-CAPILLARY: 592 mg/dL — AB (ref 65–99)
GLUCOSE-CAPILLARY: 493 mg/dL — AB (ref 65–99)
GLUCOSE-CAPILLARY: 237 mg/dL — AB (ref 65–99)
GLUCOSE-CAPILLARY: 217 mg/dL — AB (ref 65–99)
GLUCOSE-CAPILLARY: 226 mg/dL — AB (ref 65–99)
GLUCOSE-CAPILLARY: 96 mg/dL (ref 65–99)
GLUCOSE-CAPILLARY: 235 mg/dL — AB (ref 65–99)
Glucose-Capillary: 128 mg/dL — ABNORMAL HIGH (ref 65–99)
Glucose-Capillary: 158 mg/dL — ABNORMAL HIGH (ref 65–99)
Glucose-Capillary: 173 mg/dL — ABNORMAL HIGH (ref 65–99)
Glucose-Capillary: 228 mg/dL — ABNORMAL HIGH (ref 65–99)
Glucose-Capillary: 313 mg/dL — ABNORMAL HIGH (ref 65–99)
Glucose-Capillary: 337 mg/dL — ABNORMAL HIGH (ref 65–99)
Glucose-Capillary: >600 mg/dL (ref 65–99)
Glucose-Capillary: >600 mg/dL (ref 65–99)
Glucose-Capillary: >600 mg/dL (ref 65–99)

## 2017-08-09 LAB — URINALYSIS, MICROSCOPIC (REFLEX)

## 2017-08-09 LAB — MRSA PCR SCREENING: MRSA by PCR: NEGATIVE

## 2017-08-09 MED ORDER — INSULIN GLARGINE 100 UNIT/ML ~~LOC~~ SOLN
10.0000 [IU] | Freq: Every day | SUBCUTANEOUS | Status: DC
  Administered 2017-08-09: 10 [IU] via SUBCUTANEOUS
  Filled 2017-08-09: qty 0.1

## 2017-08-09 MED ORDER — INSULIN GLARGINE 100 UNIT/ML ~~LOC~~ SOLN: 10.0000 [IU] | Freq: Every day | SUBCUTANEOUS | Status: DC

## 2017-08-09 MED ORDER — MIDAZOLAM HCL 2 MG/2ML IJ SOLN
INTRAMUSCULAR | Status: AC
  Filled 2017-08-09: qty 2

## 2017-08-09 MED ORDER — INSULIN ASPART 100 UNIT/ML ~~LOC~~ SOLN: 0.0000 [IU] | Freq: Three times a day (TID) | SUBCUTANEOUS | Status: DC

## 2017-08-09 MED ORDER — PHENYLEPHRINE HCL-NACL 10-0.9 MG/250ML-% IV SOLN
0.0000 ug/min | INTRAVENOUS | Status: DC
Start: 2017-08-09 — End: 2017-08-10
  Administered 2017-08-09: 20 ug/min via INTRAVENOUS
  Administered 2017-08-10: 400 ug/min via INTRAVENOUS
  Filled 2017-08-09 (×4): qty 250

## 2017-08-09 MED ORDER — SODIUM CHLORIDE 0.9 % IV BOLUS (SEPSIS)
1000.0000 mL | Freq: Once | INTRAVENOUS | Status: AC
  Administered 2017-08-09: 1000 mL via INTRAVENOUS

## 2017-08-09 MED ORDER — SODIUM CHLORIDE 0.9 % IV SOLN: INTRAVENOUS | Status: DC | PRN

## 2017-08-09 MED ORDER — PHENYLEPHRINE HCL-NACL 10-0.9 MG/250ML-% IV SOLN
INTRAVENOUS | Status: AC
  Administered 2017-08-09: 20 ug/min via INTRAVENOUS
  Filled 2017-08-09: qty 250

## 2017-08-09 MED ORDER — SODIUM CHLORIDE 0.9 % IV BOLUS (SEPSIS)
500.0000 mL | Freq: Once | INTRAVENOUS | Status: AC
  Administered 2017-08-09: 500 mL via INTRAVENOUS

## 2017-08-09 MED ORDER — DEXTROSE 5 % IV SOLN
2.0000 g | Freq: Every day | INTRAVENOUS | Status: DC
  Administered 2017-08-10 (×2): 2 g via INTRAVENOUS
  Filled 2017-08-09 (×4): qty 2

## 2017-08-09 MED ORDER — POTASSIUM CHLORIDE 10 MEQ/100ML IV SOLN
10.0000 meq | INTRAVENOUS | Status: AC
  Administered 2017-08-09 (×4): 10 meq via INTRAVENOUS
  Filled 2017-08-09 (×4): qty 100

## 2017-08-09 MED ORDER — INSULIN ASPART 100 UNIT/ML ~~LOC~~ SOLN
0.0000 [IU] | Freq: Every day | SUBCUTANEOUS | Status: DC
  Administered 2017-08-09: 2 [IU] via SUBCUTANEOUS

## 2017-08-09 MED ORDER — SODIUM CHLORIDE 0.45 % IV SOLN
INTRAVENOUS | Status: DC
  Administered 2017-08-09 – 2017-08-10 (×2): via INTRAVENOUS

## 2017-08-09 MED ORDER — VANCOMYCIN HCL IN DEXTROSE 1-5 GM/200ML-% IV SOLN
1000.0000 mg | Freq: Once | INTRAVENOUS | Status: AC
  Administered 2017-08-10: 1000 mg via INTRAVENOUS
  Filled 2017-08-09: qty 200

## 2017-08-09 MED ORDER — LACTATED RINGERS IV BOLUS (SEPSIS)
1000.0000 mL | Freq: Once | INTRAVENOUS | Status: AC
  Administered 2017-08-09: 1000 mL via INTRAVENOUS

## 2017-08-09 NOTE — Progress Notes (Signed)
eLink Physician-Brief Progress Note Patient Name: Stephanie Huber DOB: [birth year] MRN: [id number]   Date of Service  08/09/2017  HPI/Events of Note  Remains hypotensive, poorly responsive despite boluses.   eICU Interventions  - phenylephrine ordered. Will plan to place CVC - place art line, although I am more convinced now that the cuff pressures are accurate given her change in MS.  - ABG now - empiric abx ordered > vanco + ceftaz     Intervention Category Major Interventions: Shock - evaluation and management  Leslye PeerRobert S Byrum 08/09/2017, 10:42 PM

## 2017-08-09 NOTE — Procedures (Signed)
Central Venous Catheter Insertion Procedure Note Aldean JewettKeeshia D Bai [id number] 1966/07/26  Procedure: Insertion of Central Venous Catheter Indications: Drug and/or fluid administration  Procedure Details Consent: Risks of procedure as well as the alternatives and risks of each were explained to the (patient/caregiver).  Consent for procedure obtained. Time Out: Verified patient identification, verified procedure, site/side was marked, verified correct patient position, special equipment/implants available, medications/allergies/relevent history reviewed, required imaging and test results available.  Performed  Maximum sterile technique was used including antiseptics, cap, gloves, gown, hand hygiene, mask and sheet. Skin prep: Chlorhexidine; local anesthetic administered A antimicrobial bonded/coated triple lumen catheter was placed in the left femoral vein due to multiple attempts, no other available access using the Seldinger technique. Left IJ two first sticks but guidewire does not thread. Right IJ first stick did not thread also.   Evaluation Blood flow good Complications: No apparent complications Patient did tolerate procedure well.   Hilde Churchman Z Tayveon Lombardo 08/09/2017, 11:56 PM

## 2017-08-09 NOTE — Progress Notes (Signed)
CRITICAL VALUE ALERT  Critical Value:  Cl <65  Date & Time Notied:  08/09/17 @ 0009  Provider Notified: MD already aware. Trending up  Orders Received/Actions taken: Orders already received

## 2017-08-09 NOTE — Progress Notes (Signed)
eLink Physician-Brief Progress Note Patient Name: Stephanie Huber DOB: 1965-11-25 MRN: [id number]   Date of Service  08/09/2017  HPI/Events of Note  Hypotension, tachypnea. 0.45 NS running 75cc/h, has received boluses total 1L today. No BMP since 11:00  eICU Interventions  - BMP now to assess degree of acidosis - 1L NS bolus now.  - confirm BP multiple locations.      Intervention Category Major Interventions: Hypotension - evaluation and management;Other:  Stephanie PeerRobert S [name] 08/09/2017, 8:49 PM

## 2017-08-09 NOTE — Progress Notes (Signed)
CRITICAL VALUE ALERT  Critical Value:  Na+118  Date & Time Notied:  08/09/17 @ 0009  Provider Notified: MD already aware. Trending up Orders Received/Actions taken: Orders already given

## 2017-08-09 NOTE — Progress Notes (Signed)
eLink Physician-Brief Progress Note Patient Name: Stephanie JewettKeeshia D Holdsworth DOB: 04-13-1966 MRN: [id number]   Date of Service  08/09/2017  HPI/Events of Note  IVF bolus infusing, remains hypotensive. No fevers recorded. RR 30-50. No focus of infection evident, no meds that would contribute to hypotension, labs pending  eICU Interventions  - send blood cx and UA - consider empiric abx, although again no clear evidence infxn     Intervention Category Major Interventions: Shock - evaluation and management  Leslye PeerRobert S Ruperto Kiernan 08/09/2017, 9:17 PM

## 2017-08-09 NOTE — H&P (Signed)
PULMONARY / CRITICAL CARE MEDICINE   Name: Stephanie Huber MRN: [id number] DOB: 06-07-1966    ADMISSION DATE:  08/07/2017 CONSULTATION DATE:  07/22/2017 REFERRING MD: ED  CHIEF COMPLAINT:  Weakness   HISTORY OF PRESENT ILLNESS:   [name] is a 51 yr old lady with DMI, CKD, HTN coming in with altered mental status and as per cousin she has not been taking her insulin and has been feeling weak and sick. No fever chills or rigors. No chest pain or focal weakness.   Patient was found to have glucose of ~2600 and started on insulin drip.   Subjective:  Glu more controlled Agitation  Pos 6.2 liters  VITAL SIGNS: BP 99/69   Pulse (!) 102   Temp 98.1 F (36.7 C) (Oral)   Resp (!) 21   Ht _0  (1.651 m)   Wt 58.5 kg (129 lb)   SpO2 96%   BMI 21.47 kg/m   HEMODYNAMICS:    VENTILATOR SETTINGS:    INTAKE / OUTPUT: I/O last 3 completed shifts: In: 6917.4 [I.V.:1568.6; IV Piggyback:5348.9] Out: 500 [Urine:500]  PHYSICAL EXAMINATION: General: moaning, angry, screaming at times Neuro: rass 3 at times, moves all ext HEENT: jvd down PULM: CTA CV:  s1 s2 RRT GI: soft, BS wnl, no r Extremities:  No rash, no swollen joints, no edema  LABS:  BMET Recent Labs  Lab 07/27/2017 2308 08/09/17 0328 08/09/17 0748  NA 118* 129* 131*  K 4.5 3.5 3.3*  CL <65* 76* 85*  CO2 16* 28 30  BUN 74* 64* 57*  CREATININE 6.21* 5.11* 4.08*  GLUCOSE 1,151* 663* 280*    Electrolytes Recent Labs  Lab 07/30/2017 2308 08/09/17 0328 08/09/17 0748  CALCIUM 8.4* 8.3* 7.8*    CBC Recent Labs  Lab 08/13/2017 1830  WBC 13.7*  HGB 13.5  HCT 38.7  PLT 289    Coag's No results for input(s): APTT, INR in the last 168 hours.  Sepsis Markers No results for input(s): LATICACIDVEN, PROCALCITON, O2SATVEN in the last 168 hours.  ABG No results for input(s): PHART, PCO2ART, PO2ART in the last 168 hours.  Liver Enzymes Recent Labs  Lab 08/01/2017 1830  AST 105*  ALT 101*  ALKPHOS  182*  BILITOT 1.8*  ALBUMIN 3.8    Cardiac Enzymes No results for input(s): TROPONINI, PROBNP in the last 168 hours.  Glucose Recent Labs  Lab 08/09/17 0355 08/09/17 0525 08/09/17 0629 08/09/17 0725 08/09/17 0827 08/09/17 0933  GLUCAP 592* 493* 337* 313* 228* 237*    Imaging Dg Chest Portable 1 View  Result Date: 07/21/2017 CLINICAL DATA:  Weakness.  Altered mental status. EXAM: PORTABLE CHEST 1 VIEW COMPARISON:  Radiograph 03/08/2017 FINDINGS: Mild rotation and anti lordotic positioning. The cardiomediastinal contours are normal for technique. The lungs are clear. Pulmonary vasculature is normal. No consolidation, pleural effusion, or pneumothorax. No acute osseous abnormalities are seen. IMPRESSION: No acute abnormality. Electronically Signed   By: Jeb Levering M.D.   On: 07/23/2017 18:06     ASSESSMENT / PLAN:  - Hyperosmolar non ketotic acidosis -too rapid rise Na? With correction not true - metabolic encephalopathy - acute kidney injury -hypokalemia -11/24 small AG 16, met alk - severe hyponatremia  - acute transaminitis  - non compliance - suspected depression  - hypocalcemia -psychiatric dz?  Plan: - glu is correcting, main issue here is HONK with hypovolemia -would favor keeping on insulin infusion for now, likely transition in evening to SSI, lantus -replace K , repeat  Bmet frequent -repeat lft in am  -no role bicarb -I do not want Na to rise further as fast , however with correction of na for glu 1300 = 133 -134 when she arrives -hold any d5w, keep 1/2 NS for now, with frequent bmets, reduce rate -gap is not large, would prefer to use Critical illness  -met alk , may be from vomiting on admission -will need psych evaluation, some of her symptoms likely from Na  / osm changes -keep lovenox for dvt prevention -crt falling, fluids to remain -reglan dc further Ccm time 35 min   Lavon Paganini. Titus Mould, MD, FACP Pgr: [facility]  [facility] [place] Pager: [phone]  08/09/2017, 10:08 AM

## 2017-08-09 NOTE — Progress Notes (Signed)
CRITICAL VALUE ALERT  Critical Value:  Glucose 1151  Date & Time Notied:  08/09/17 @ 0009  Provider Notified: MD already aware. Trending down  Orders Received/Actions taken: Insulin protocol already in progress

## 2017-08-10 ENCOUNTER — Inpatient Hospital Stay (HOSPITAL_COMMUNITY): Payer: Medicare Other

## 2017-08-10 ENCOUNTER — Inpatient Hospital Stay (HOSPITAL_COMMUNITY): Payer: Medicare Other | Admitting: Registered Nurse

## 2017-08-10 DIAGNOSIS — I361 Nonrheumatic tricuspid (valve) insufficiency: Secondary | ICD-10-CM

## 2017-08-10 DIAGNOSIS — J9601 Acute respiratory failure with hypoxia: Secondary | ICD-10-CM

## 2017-08-10 DIAGNOSIS — E1010 Type 1 diabetes mellitus with ketoacidosis without coma: Secondary | ICD-10-CM

## 2017-08-10 DIAGNOSIS — Z9119 Patient's noncompliance with other medical treatment and regimen: Secondary | ICD-10-CM

## 2017-08-10 DIAGNOSIS — R570 Cardiogenic shock: Secondary | ICD-10-CM

## 2017-08-10 LAB — ECHOCARDIOGRAM COMPLETE
E decel time: 88 msec
EERAT: 11.24
FS: 6 % — AB (ref 28–44)
HEIGHTINCHES: 65 in
IVS/LV PW RATIO, ED: 0.94
LA diam end sys: 22 mm
LA diam index: 1.34 cm/m2
LA vol A4C: 10.7 ml
LA vol index: 9.9 mL/m2
LASIZE: 22 mm
LAVOL: 16.2 mL
LDCA: 2.27 cm2
LV E/e'average: 11.24
LV e' LATERAL: 7.07 cm/s
LVEEMED: 11.24
LVOT diameter: 17 mm
MV Dec: 88
MVPG: 3 mmHg
MVPKAVEL: 41.7 m/s
MVPKEVEL: 79.5 m/s
PW: 6.82 mm — AB (ref 0.6–1.1)
RV LATERAL S' VELOCITY: 5.55 cm/s
TDI e' lateral: 7.07
TDI e' medial: 3.92
WEIGHTICAEL: 2064 [oz_av]

## 2017-08-10 LAB — BASIC METABOLIC PANEL
ANION GAP: 8 (ref 5–15)
Anion gap: 16 — ABNORMAL HIGH (ref 5–15)
BUN: 59 mg/dL — ABNORMAL HIGH (ref 6–20)
BUN: 45 mg/dL — AB (ref 6–20)
BUN: 62 mg/dL — AB (ref 6–20)
CHLORIDE: 100 mmol/L — AB (ref 101–111)
CHLORIDE: 116 mmol/L — AB (ref 101–111)
CO2: 21 mmol/L — ABNORMAL LOW (ref 22–32)
CO2: 11 mmol/L — ABNORMAL LOW (ref 22–32)
CO2: <7 mmol/L — ABNORMAL LOW (ref 22–32)
CREATININE: 3.61 mg/dL — AB (ref 0.44–1.00)
CREATININE: 2.47 mg/dL — AB (ref 0.44–1.00)
Calcium: 8.9 mg/dL (ref 8.9–10.3)
Calcium: 6.6 mg/dL — ABNORMAL LOW (ref 8.9–10.3)
Calcium: 4.3 mg/dL — CL (ref 8.9–10.3)
Chloride: 102 mmol/L (ref 101–111)
Creatinine, Ser: 3.7 mg/dL — ABNORMAL HIGH (ref 0.44–1.00)
GFR calc Af Amer: 16 mL/min — ABNORMAL LOW (ref >60)
GFR calc Af Amer: 25 mL/min — ABNORMAL LOW (ref >60)
GFR calc non Af Amer: 14 mL/min — ABNORMAL LOW (ref >60)
GFR calc non Af Amer: 22 mL/min — ABNORMAL LOW (ref >60)
GFR, EST AFRICAN AMERICAN: 15 mL/min — AB (ref >60)
GFR, EST NON AFRICAN AMERICAN: 13 mL/min — AB (ref >60)
GLUCOSE: 195 mg/dL — AB (ref 65–99)
Glucose, Bld: 286 mg/dL — ABNORMAL HIGH (ref 65–99)
Glucose, Bld: 170 mg/dL — ABNORMAL HIGH (ref 65–99)
POTASSIUM: 4.4 mmol/L (ref 3.5–5.1)
Potassium: 5 mmol/L (ref 3.5–5.1)
Potassium: 3.8 mmol/L (ref 3.5–5.1)
SODIUM: 127 mmol/L — AB (ref 135–145)
Sodium: 131 mmol/L — ABNORMAL LOW (ref 135–145)
Sodium: 133 mmol/L — ABNORMAL LOW (ref 135–145)

## 2017-08-10 LAB — POCT I-STAT, CHEM 8
BUN: 52 mg/dL — AB (ref 6–20)
CALCIUM ION: 0.79 mmol/L — AB (ref 1.15–1.40)
CREATININE: 3.4 mg/dL — AB (ref 0.44–1.00)
Chloride: 98 mmol/L — ABNORMAL LOW (ref 101–111)
Glucose, Bld: 384 mg/dL — ABNORMAL HIGH (ref 65–99)
HCT: 29 % — ABNORMAL LOW (ref 36.0–46.0)
HEMOGLOBIN: 9.9 g/dL — AB (ref 12.0–15.0)
Potassium: 5.2 mmol/L — ABNORMAL HIGH (ref 3.5–5.1)
SODIUM: 128 mmol/L — AB (ref 135–145)
TCO2: 12 mmol/L — AB (ref 22–32)

## 2017-08-10 LAB — HEPATIC FUNCTION PANEL
ALK PHOS: 225 U/L — AB (ref 38–126)
ALT: 629 U/L — ABNORMAL HIGH (ref 14–54)
AST: 2520 U/L — ABNORMAL HIGH (ref 15–41)
Albumin: 1 g/dL — ABNORMAL LOW (ref 3.5–5.0)
BILIRUBIN INDIRECT: 0.7 mg/dL (ref 0.3–0.9)
BILIRUBIN TOTAL: 1.4 mg/dL — AB (ref 0.3–1.2)
Bilirubin, Direct: 0.7 mg/dL — ABNORMAL HIGH (ref 0.1–0.5)
Total Protein: <3 g/dL — ABNORMAL LOW (ref 6.5–8.1)

## 2017-08-10 LAB — GLUCOSE, CAPILLARY
GLUCOSE-CAPILLARY: 111 mg/dL — AB (ref 65–99)
GLUCOSE-CAPILLARY: 111 mg/dL — AB (ref 65–99)
GLUCOSE-CAPILLARY: 345 mg/dL — AB (ref 65–99)
GLUCOSE-CAPILLARY: 40 mg/dL — AB (ref 65–99)
GLUCOSE-CAPILLARY: 61 mg/dL — AB (ref 65–99)
Glucose-Capillary: 94 mg/dL (ref 65–99)
Glucose-Capillary: 136 mg/dL — ABNORMAL HIGH (ref 65–99)
Glucose-Capillary: 146 mg/dL — ABNORMAL HIGH (ref 65–99)
Glucose-Capillary: 98 mg/dL (ref 65–99)
Glucose-Capillary: 344 mg/dL — ABNORMAL HIGH (ref 65–99)
Glucose-Capillary: 355 mg/dL — ABNORMAL HIGH (ref 65–99)

## 2017-08-10 LAB — POCT I-STAT 3, ART BLOOD GAS (G3+)
ACID-BASE DEFICIT: 17 mmol/L — AB (ref 0.0–2.0)
Acid-base deficit: 21 mmol/L — ABNORMAL HIGH (ref 0.0–2.0)
BICARBONATE: 5.8 mmol/L — AB (ref 20.0–28.0)
Bicarbonate: 8 mmol/L — ABNORMAL LOW (ref 20.0–28.0)
O2 SAT: 100 %
O2 Saturation: 99 %
PCO2 ART: 15.6 mmHg — AB (ref 32.0–48.0)
PH ART: 7.176 — AB (ref 7.350–7.450)
PO2 ART: 164 mmHg — AB (ref 83.0–108.0)
PO2 ART: 411 mmHg — AB (ref 83.0–108.0)
TCO2: 6 mmol/L — AB (ref 22–32)
TCO2: 9 mmol/L — ABNORMAL LOW (ref 22–32)
pCO2 arterial: 19.3 mmHg — CL (ref 32.0–48.0)
pH, Arterial: 7.231 — ABNORMAL LOW (ref 7.350–7.450)

## 2017-08-10 LAB — BLOOD GAS, ARTERIAL
ACID-BASE DEFICIT: 20.8 mmol/L — AB (ref 0.0–2.0)
Acid-base deficit: 6.8 mmol/L — ABNORMAL HIGH (ref 0.0–2.0)
BICARBONATE: 6.6 mmol/L — AB (ref 20.0–28.0)
Bicarbonate: 17.7 mmol/L — ABNORMAL LOW (ref 20.0–28.0)
DRAWN BY: 103701
DRAWN BY: 51425
Drawn by: 103701
FIO2: 100
FIO2: 40
FIO2: 40
LHR: 20 {breaths}/min
LHR: 35 {breaths}/min
MECHVT: 450 mL
MECHVT: 450 mL
O2 Saturation: 99.8 %
O2 Saturation: 97.5 %
O2 Saturation: 97.1 %
PATIENT TEMPERATURE: 98.6
PATIENT TEMPERATURE: 98.6
PEEP/CPAP: 5 cmH2O
PEEP/CPAP: 5 cmH2O
PEEP: 5 cmH2O
PH ART: 7.158 — AB (ref 7.350–7.450)
PO2 ART: 125 mmHg — AB (ref 83.0–108.0)
PO2 ART: 121 mmHg — AB (ref 83.0–108.0)
Patient temperature: 98.6
RATE: 20 resp/min
VT: 450 mL
pCO2 arterial: 33.8 mmHg (ref 32.0–48.0)
pCO2 arterial: 19.5 mmHg — CL (ref 32.0–48.0)
pH, Arterial: 7.338 — ABNORMAL LOW (ref 7.350–7.450)
pH, Arterial: 7.198 — CL (ref 7.350–7.450)
pO2, Arterial: 396 mmHg — ABNORMAL HIGH (ref 83.0–108.0)

## 2017-08-10 LAB — COOXEMETRY PANEL
CARBOXYHEMOGLOBIN: 1 % (ref 0.5–1.5)
METHEMOGLOBIN: 1 % (ref 0.0–1.5)
O2 SAT: 71.2 %
Total hemoglobin: 10.2 g/dL — ABNORMAL LOW (ref 12.0–16.0)

## 2017-08-10 LAB — TRIGLYCERIDES: TRIGLYCERIDES: 46 mg/dL (ref <150)

## 2017-08-10 LAB — CG4 I-STAT (LACTIC ACID): Lactic Acid, Venous: 14.18 mmol/L (ref 0.5–1.9)

## 2017-08-10 LAB — CBC
HEMATOCRIT: 26.4 % — AB (ref 36.0–46.0)
HEMATOCRIT: 32.3 % — AB (ref 36.0–46.0)
HEMOGLOBIN: 9 g/dL — AB (ref 12.0–15.0)
Hemoglobin: 11.1 g/dL — ABNORMAL LOW (ref 12.0–15.0)
MCH: 25.3 pg — ABNORMAL LOW (ref 26.0–34.0)
MCH: 25.5 pg — ABNORMAL LOW (ref 26.0–34.0)
MCHC: 34.1 g/dL (ref 30.0–36.0)
MCHC: 34.4 g/dL (ref 30.0–36.0)
MCV: 74.2 fL — ABNORMAL LOW (ref 78.0–100.0)
MCV: 74.1 fL — AB (ref 78.0–100.0)
Platelets: 170 10*3/uL (ref 150–400)
Platelets: 174 10*3/uL (ref 150–400)
RBC: 3.56 MIL/uL — AB (ref 3.87–5.11)
RBC: 4.36 MIL/uL (ref 3.87–5.11)
RDW: 12.4 % (ref 11.5–15.5)
RDW: 12.5 % (ref 11.5–15.5)
WBC: 3.9 10*3/uL — ABNORMAL LOW (ref 4.0–10.5)
WBC: 1.6 10*3/uL — AB (ref 4.0–10.5)

## 2017-08-10 LAB — RAPID URINE DRUG SCREEN, HOSP PERFORMED
Amphetamines: NOT DETECTED
Barbiturates: NOT DETECTED
Benzodiazepines: POSITIVE — AB
Cocaine: NOT DETECTED
OPIATES: NOT DETECTED
Tetrahydrocannabinol: NOT DETECTED

## 2017-08-10 LAB — PROCALCITONIN

## 2017-08-10 LAB — APTT: APTT: 34 s (ref 24–36)

## 2017-08-10 LAB — PROTIME-INR
INR: 1.37
PROTHROMBIN TIME: 16.8 s — AB (ref 11.4–15.2)

## 2017-08-10 LAB — LACTIC ACID, PLASMA
LACTIC ACID, VENOUS: 4.9 mmol/L — AB (ref 0.5–1.9)
LACTIC ACID, VENOUS: 6.5 mmol/L — AB (ref 0.5–1.9)

## 2017-08-10 LAB — CK: CK TOTAL: 473 U/L — AB (ref 38–234)

## 2017-08-10 MED ORDER — VASOPRESSIN 20 UNIT/ML IV SOLN
0.0300 [IU]/min | INTRAVENOUS | Status: DC
  Administered 2017-08-10: 0.03 [IU]/min via INTRAVENOUS
  Filled 2017-08-10 (×4): qty 2

## 2017-08-10 MED ORDER — MILRINONE LACTATE IN DEXTROSE 20-5 MG/100ML-% IV SOLN
0.2500 ug/kg/min | INTRAVENOUS | Status: DC
  Administered 2017-08-10 – 2017-08-11 (×3): 0.25 ug/kg/min via INTRAVENOUS
  Filled 2017-08-10 (×2): qty 100

## 2017-08-10 MED ORDER — INSULIN ASPART 100 UNIT/ML ~~LOC~~ SOLN
0.0000 [IU] | SUBCUTANEOUS | Status: DC
  Administered 2017-08-10: 2 [IU] via SUBCUTANEOUS
  Administered 2017-08-10 (×2): 11 [IU] via SUBCUTANEOUS
  Administered 2017-08-10: 15 [IU] via SUBCUTANEOUS
  Administered 2017-08-11: 11 [IU] via SUBCUTANEOUS
  Administered 2017-08-11: 15 [IU] via SUBCUTANEOUS

## 2017-08-10 MED ORDER — SODIUM BICARBONATE 8.4 % IV SOLN
INTRAVENOUS | Status: DC
  Administered 2017-08-10 – 2017-08-11 (×3): via INTRAVENOUS
  Filled 2017-08-10 (×5): qty 150

## 2017-08-10 MED ORDER — DEXTROSE 50 % IV SOLN
INTRAVENOUS | Status: AC
  Administered 2017-08-10: 50 mL
  Filled 2017-08-10: qty 50

## 2017-08-10 MED ORDER — FLUCONAZOLE IN SODIUM CHLORIDE 200-0.9 MG/100ML-% IV SOLN
200.0000 mg | INTRAVENOUS | Status: DC
  Administered 2017-08-10: 200 mg via INTRAVENOUS
  Filled 2017-08-10 (×2): qty 100

## 2017-08-10 MED ORDER — PHENYLEPHRINE HCL 10 MG/ML IJ SOLN
0.0000 ug/min | INTRAMUSCULAR | Status: DC
  Administered 2017-08-10: 400 ug/min via INTRAVENOUS
  Administered 2017-08-10 (×2): 300 ug/min via INTRAVENOUS
  Administered 2017-08-10: 200 ug/min via INTRAVENOUS
  Filled 2017-08-10 (×2): qty 40
  Filled 2017-08-10: qty 4
  Filled 2017-08-10: qty 40

## 2017-08-10 MED ORDER — SODIUM BICARBONATE 8.4 % IV SOLN
INTRAVENOUS | Status: AC
  Filled 2017-08-10: qty 50

## 2017-08-10 MED ORDER — NOREPINEPHRINE BITARTRATE 1 MG/ML IV SOLN
0.0000 ug/min | INTRAVENOUS | Status: DC
  Administered 2017-08-10 (×2): 100 ug/min via INTRAVENOUS
  Administered 2017-08-10: 80 ug/min via INTRAVENOUS
  Administered 2017-08-10: 100 ug/min via INTRAVENOUS
  Administered 2017-08-10: 40 ug/min via INTRAVENOUS
  Administered 2017-08-10: 80 ug/min via INTRAVENOUS
  Administered 2017-08-11 (×4): 100 ug/min via INTRAVENOUS
  Filled 2017-08-10 (×10): qty 16
  Filled 2017-08-10: qty 8
  Filled 2017-08-10 (×2): qty 16

## 2017-08-10 MED ORDER — ORAL CARE MOUTH RINSE
15.0000 mL | Freq: Four times a day (QID) | OROMUCOSAL | Status: DC
  Administered 2017-08-10: 15 mL via OROMUCOSAL

## 2017-08-10 MED ORDER — NOREPINEPHRINE BITARTRATE 1 MG/ML IV SOLN
0.0000 ug/min | INTRAVENOUS | Status: DC
  Administered 2017-08-10: 40 ug/min via INTRAVENOUS
  Filled 2017-08-10: qty 4

## 2017-08-10 MED ORDER — SODIUM CHLORIDE 0.9 % IV SOLN
25.0000 ug/h | INTRAVENOUS | Status: DC
  Administered 2017-08-10: 50 ug/h via INTRAVENOUS
  Administered 2017-08-11: 150 ug/h via INTRAVENOUS
  Filled 2017-08-10 (×2): qty 50

## 2017-08-10 MED ORDER — SODIUM CHLORIDE 0.9 % IV SOLN
500.0000 mg | INTRAVENOUS | Status: DC
  Filled 2017-08-10: qty 500

## 2017-08-10 MED ORDER — SODIUM BICARBONATE 8.4 % IV SOLN
INTRAVENOUS | Status: AC
  Administered 2017-08-10: 100 meq
  Filled 2017-08-10: qty 100

## 2017-08-10 MED ORDER — LACTATED RINGERS IV BOLUS (SEPSIS)
1000.0000 mL | Freq: Once | INTRAVENOUS | Status: AC
  Administered 2017-08-10: 1000 mL via INTRAVENOUS

## 2017-08-10 MED ORDER — PERFLUTREN LIPID MICROSPHERE
1.0000 mL | INTRAVENOUS | Status: AC | PRN
  Administered 2017-08-10: 2 mL via INTRAVENOUS
  Filled 2017-08-10: qty 10

## 2017-08-10 MED ORDER — EPINEPHRINE PF 1 MG/ML IJ SOLN
0.5000 ug/min | INTRAVENOUS | Status: DC
  Administered 2017-08-10: 16 ug/min via INTRAVENOUS
  Administered 2017-08-10: 2 ug/min via INTRAVENOUS
  Administered 2017-08-10 – 2017-08-11 (×4): 20 ug/min via INTRAVENOUS
  Filled 2017-08-10 (×7): qty 4

## 2017-08-10 MED ORDER — PROPOFOL 1000 MG/100ML IV EMUL
0.0000 ug/kg/min | INTRAVENOUS | Status: DC
  Administered 2017-08-10 (×2): 25 ug/kg/min via INTRAVENOUS
  Filled 2017-08-10 (×2): qty 100

## 2017-08-10 MED ORDER — SODIUM CHLORIDE 0.9 % IV BOLUS (SEPSIS)
500.0000 mL | Freq: Once | INTRAVENOUS | Status: AC
  Administered 2017-08-10: 500 mL via INTRAVENOUS

## 2017-08-10 MED ORDER — MIDAZOLAM HCL 2 MG/2ML IJ SOLN
INTRAMUSCULAR | Status: AC
  Administered 2017-08-10: 2 mg
  Filled 2017-08-10: qty 2

## 2017-08-10 MED ORDER — FENTANYL BOLUS VIA INFUSION
50.0000 ug | INTRAVENOUS | Status: DC | PRN
  Administered 2017-08-10: 50 ug via INTRAVENOUS
  Filled 2017-08-10: qty 50

## 2017-08-10 MED ORDER — MIDAZOLAM HCL 2 MG/2ML IJ SOLN: 2.0000 mg | INTRAMUSCULAR | Status: DC | PRN

## 2017-08-10 MED ORDER — ORAL CARE MOUTH RINSE
15.0000 mL | OROMUCOSAL | Status: DC
  Administered 2017-08-10 – 2017-08-11 (×6): 15 mL via OROMUCOSAL

## 2017-08-10 MED ORDER — METRONIDAZOLE IN NACL 5-0.79 MG/ML-% IV SOLN
500.0000 mg | Freq: Four times a day (QID) | INTRAVENOUS | Status: DC
  Administered 2017-08-10 – 2017-08-11 (×3): 500 mg via INTRAVENOUS
  Filled 2017-08-10 (×3): qty 100

## 2017-08-10 MED ORDER — PHENYLEPHRINE HCL-NACL 10-0.9 MG/250ML-% IV SOLN
INTRAVENOUS | Status: AC
  Filled 2017-08-10: qty 250

## 2017-08-10 MED ORDER — HYDROCORTISONE NA SUCCINATE PF 100 MG IJ SOLR
50.0000 mg | Freq: Four times a day (QID) | INTRAMUSCULAR | Status: DC
  Administered 2017-08-10 – 2017-08-11 (×4): 50 mg via INTRAVENOUS
  Filled 2017-08-10 (×4): qty 2

## 2017-08-10 MED ORDER — FENTANYL CITRATE (PF) 100 MCG/2ML IJ SOLN: 50.0000 ug | Freq: Once | INTRAMUSCULAR | Status: DC

## 2017-08-10 MED ORDER — CHLORHEXIDINE GLUCONATE 0.12% ORAL RINSE (MEDLINE KIT)
15.0000 mL | Freq: Two times a day (BID) | OROMUCOSAL | Status: DC
  Administered 2017-08-10 – 2017-08-11 (×2): 15 mL via OROMUCOSAL

## 2017-08-10 MED ORDER — FENTANYL CITRATE (PF) 100 MCG/2ML IJ SOLN: 100.0000 ug | INTRAMUSCULAR | Status: DC | PRN

## 2017-08-10 MED ORDER — PANTOPRAZOLE SODIUM 40 MG IV SOLR
40.0000 mg | Freq: Every day | INTRAVENOUS | Status: DC
  Administered 2017-08-10 – 2017-08-11 (×2): 40 mg via INTRAVENOUS
  Filled 2017-08-10 (×2): qty 40

## 2017-08-10 MED ORDER — HEPARIN SODIUM (PORCINE) 5000 UNIT/ML IJ SOLN
5000.0000 [IU] | Freq: Three times a day (TID) | INTRAMUSCULAR | Status: DC
  Administered 2017-08-10 – 2017-08-11 (×2): 5000 [IU] via SUBCUTANEOUS
  Filled 2017-08-10 (×3): qty 1

## 2017-08-10 MED ORDER — LIDOCAINE HCL (PF) 1 % IJ SOLN
INTRAMUSCULAR | Status: AC
  Administered 2017-08-10: 03:00:00
  Filled 2017-08-10: qty 5

## 2017-08-10 MED ORDER — DEXMEDETOMIDINE HCL IN NACL 200 MCG/50ML IV SOLN
0.4000 ug/kg/h | INTRAVENOUS | Status: DC
  Administered 2017-08-10: 0.4 ug/kg/h via INTRAVENOUS
  Filled 2017-08-10: qty 50

## 2017-08-10 MED ORDER — CHLORHEXIDINE GLUCONATE 0.12% ORAL RINSE (MEDLINE KIT)
15.0000 mL | Freq: Two times a day (BID) | OROMUCOSAL | Status: DC
  Administered 2017-08-10: 15 mL via OROMUCOSAL

## 2017-08-10 MED FILL — Medication: Qty: 1 | Status: AC

## 2017-08-10 NOTE — Anesthesia Procedure Notes (Signed)
Arterial Line Insertion Start/End11/25/2018 2:35 AM, 08/10/2017 3:15 AM Performed by: Illene SilverEvans, Arva Slaugh E, CRNA, CRNA  Patient location: ICU. Preanesthetic checklist: patient identified, IV checked, site marked, risks and benefits discussed, surgical consent, monitors and equipment checked, pre-op evaluation, timeout performed and anesthesia consent Right, radial was placed Catheter size: 20 G Maximum sterile barriers used   Attempts: 2 Procedure performed without using ultrasound guided technique. Ultrasound Notes:anatomy identified, needle tip was noted to be adjacent to the nerve/plexus identified and no ultrasound evidence of intravascular and/or intraneural injection Following insertion, dressing applied and Biopatch. Post procedure assessment: normal  Patient tolerated the procedure well with no immediate complications. Additional procedure comments: Pt hypotensive on Ventilator, needing arterial lines for ABG's and lab. Attempt x1 unsuccessful on left radial, 1st stick on right radial.

## 2017-08-10 NOTE — Progress Notes (Signed)
   Patient now on NE 100, Epi 14, milrinone 0.25, vasopressin and bicarb gtts. 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 Would likely not benefit from left-sided only support (IABP or impella). Not ECMO candidate.   D/w Dr. Tyson AliasFeinstein.   Will continue supportive care but I think it is unlikely she will survive the night. Family updated.  40 mins additional CCT.  Arvilla Meresaniel Desirie Minteer, MD  4:47 PM

## 2017-08-10 NOTE — Progress Notes (Signed)
Spoke with MD regarding ABG results. Orders received to cancel further ABG draws and continue current support. Orders discontinued. Will continue to monitor pt closely.

## 2017-08-10 NOTE — Progress Notes (Signed)
Weaning down Neosyn per order.  Unable to wean below 30 mcg.  CBG 40 and an amp of D50W given IV with a CBG at 1300of 61.  Another amp of D50W given IV with a followup CBG of 98.  Temp was 102.8 axillary.  No tylenol ordered currently.  All above information given to Dr. Tyson AliasFeinstein with new orders to increase vent rate to 35 and increase NaHCO3 drip to 100cc/hr.  Updated family who were in the room with the patient's status and prognosis.  [facility] here at 1330 to carry [facility].  [facility]  Continue to monitor patient closely.  Leta Bucklin Debroah LoopArnold RN

## 2017-08-10 NOTE — Consult Note (Signed)
Called to bedside for Code Blue.  On my arrival the patient is intubated and critical care is now at the bedside managing ACLS.

## 2017-08-10 NOTE — Progress Notes (Signed)
CRITICAL VALUE ALERT  Critical Value:  Lactic acid 6.5  Date & Time Notied:  08/10/17 0825  Provider Notified: Dr. Tyson AliasFeinstein  Orders Received/Actions taken: new orders received

## 2017-08-10 NOTE — Progress Notes (Signed)
  Echocardiogram 2D Echocardiogram with Definity has been performed.  Nolon RodBrown, Tony 08/10/2017, 12:00 PM

## 2017-08-10 NOTE — Progress Notes (Signed)
Pharmacy Antibiotic Note  Stephanie Huber is a 51 y.o. female admitted on 2017/08/25 with sepsis.  Pharmacy has been consulted for Vancomycin, ceftazidime dosing.  Plan: Vancomycin 1gm iv x1, then 500mg  iv q36hr  Goal AUC = 400 - 500 for all indications, except meningitis (goal AUC > 500 and Cmin 15-20 mcg/mL) Ceftazidime 2gm iv q24hr   Height: 5\' 5"  (165.1 cm) Weight: 129 lb (58.5 kg) IBW/kg (Calculated) : 57  Temp (24hrs), Avg:98.7 F (37.1 C), Min:98.1 F (36.7 C), Max:99.5 F (37.5 C)  Recent Labs  Lab 11-30-16 1830  08/09/17 0328 08/09/17 0748 08/09/17 1110 08/09/17 2108 08/10/17 0029  WBC 13.7*  --   --   --   --   --  3.9*  CREATININE 6.35*   < > 5.11* 4.08* 3.89* 4.29* 3.70*  LATICACIDVEN  --   --   --   --   --   --  4.9*   < > = values in this interval not displayed.    Estimated Creatinine Clearance: 16.4 mL/min (A) (by C-G formula based on SCr of 3.7 mg/dL (H)).    No Known Allergies  Antimicrobials this admission: Vancomycin 08/09/2017 >> Ceftazidime 08/09/2017 >>   Dose adjustments this admission: -  Microbiology results: pending  Thank you for allowing pharmacy to be a part of this patient's care.  Aleene DavidsonGrimsley Jr, Niam Nepomuceno Crowford 08/10/2017 5:28 AM

## 2017-08-10 NOTE — Consult Note (Signed)
  Advanced Heart Failure Team Consult Note   Referring Physician: Feinstein  Reason for Consultation: Cardiogenic shock  HPI:    Stephanie Huber is seen today for evaluation of cardiogenic shock at the request of Dr. Feinstein.   Stephanie Cloninger is a 51 yr old woman with DMI, CKD, HTN admitted 11/24 with hyperosmolar coma in setting of apparent medication noncompliance  She was admitted both in February 2017 and June 2018 for DKA. Family says she has been depressed recently and stopped taking her meds and eating for a few days.   Patient was found to have glucose of ~2600 and started on insulin drip. Developed progressive MSOF and brief PEA arrest. Intubated. Progressive shock with multiple high-dose pressors and severe acidosis.   Echo ordered by Dr. Feinstein EF 10-15%, RV severely Hk  Now on levophed 100, bicarb gtt, neo at 30, vasopressin and milrinone Intubated and sedated. SBP 90-100  Last ABG  7.15/19/121/97% on 100%  Review of Systems: unavailable. Patient intubated and sedated.    Home Medications Prior to Admission medications   Medication Sig Start Date End Date Taking? Authorizing Provider  glucose monitoring kit (FREESTYLE) monitoring kit 1 each by Does not apply route as needed for other. Dispense any model that is covered- dispense testing supplies for Q AC/ HS accuchecks- 1 month supply with one refil. 03/13/17  Yes Rizwan, Saima, MD  HUMALOG 100 UNIT/ML injection INJECT 2-10UNITS UNDER SKIN 3 TIMES A DAY BEFORE MEALS PER SLIDING SCALE AS DIRECTED. 05/09/17  Yes [provider]  insulin aspart (NOVOLOG) 100 UNIT/ML injection Inject 0-9 Units into the skin 3 (three) times daily with meals. CBG 70 - 120: 0 units  CBG 121 - 150: 1 unit  CBG 151 - 200: 2 units  CBG 201 - 250: 3 units  CBG 251 - 300: 5 units  CBG 301 - 350: 7 units  CBG 351 - 400 9 units   Use this scale until your appetite improves. Then go back to using 5mg of Novolog with each meal. 03/13/17   Yes Rizwan, Saima, MD  insulin glargine (LANTUS) 100 UNIT/ML injection Inject 0.25 mLs (25 Units total) into the skin daily. Patient taking differently: Inject 30 Units into the skin at bedtime.  02/21/15  Yes Rai, Ripudeep K, MD  INSULIN SYRINGE .5CC/28G 28G X 1/2" 0.5 ML MISC 1 each by Does not apply route 4 (four) times daily -  before meals and at bedtime. 03/13/17  Yes Rizwan, Saima, MD  Insulin Syringes, Disposable, U-100 1 ML MISC Use as directed 02/21/15  Yes Rai, Ripudeep K, MD  lisinopril-hydrochlorothiazide (PRINZIDE,ZESTORETIC) 20-25 MG tablet Take 1 tablet by mouth daily. 07/04/17  Yes [provider]  amoxicillin-clavulanate (AUGMENTIN) 875-125 MG tablet Take 1 tablet by mouth 2 (two) times daily. Patient not taking: Reported on 08/09/2017 03/13/17   Rizwan, Saima, MD  feeding supplement, GLUCERNA SHAKE, (GLUCERNA SHAKE) LIQD Take 237 mLs by mouth 3 (three) times daily between meals. Patient not taking: Reported on 08/09/2017 03/13/17   Rizwan, Saima, MD  traMADol (ULTRAM) 50 MG tablet Take 1-2 tablets (50-100 mg total) by mouth every 6 (six) hours as needed for moderate pain (use 50 mg for mild pain and 100 mg for moderate pain). Patient not taking: Reported on 08/09/2017 03/13/17   Rizwan, Saima, MD   Current meds:  . chlorhexidine gluconate (MEDLINE KIT)  15 mL Mouth Rinse BID  . fentaNYL (SUBLIMAZE) injection  50 mcg Intravenous Once  . heparin subcutaneous    5,000 Units Subcutaneous Q8H  . hydrocortisone sodium succinate  50 mg Intravenous Q6H  . insulin aspart  0-15 Units Subcutaneous Q4H  . mouth rinse  15 mL Mouth Rinse QID  . pantoprazole (PROTONIX) IV  40 mg Intravenous Daily    Past Medical History: Past Medical History:  Diagnosis Date  . CKD (chronic kidney disease), stage III ([place])   . Essential hypertension   . Type 1 diabetes mellitus ([place])     Past Surgical History: Past Surgical History:  Procedure Laterality Date  . INCISION AND DRAINAGE ABSCESS  Left 03/11/2017   Procedure: INCISION AND DRAINAGE LEFT THIGH ABSCESS;  Surgeon: Rolm Bookbinder, MD;  Location: [facility];  Service: General;  Laterality: Left;    Family History: Family History  Problem Relation Age of Onset  . Dementia Unknown     Social History: Social History   Socioeconomic History  . Marital status: Single    Spouse name: None  . Number of children: None  . Years of education: None  . Highest education level: None  Social Needs  . Financial resource strain: None  . Food insecurity - worry: None  . Food insecurity - inability: None  . Transportation needs - medical: None  . Transportation needs - non-medical: None  Occupational History  . None  Tobacco Use  . Smoking status: Current Some Day Smoker  . Smokeless tobacco: Never Used  Substance and Sexual Activity  . Alcohol use: No  . Drug use: No  . Sexual activity: Yes  Other Topics Concern  . None  Social History Narrative  . None    Allergies:  No Known Allergies  Objective:    Vital Signs:   Temp:  [97.8 F (36.6 C)-99.5 F (37.5 C)] 97.8 F (36.6 C) (11/25 0800) Pulse Rate:  [33-161] 36 (11/25 1230) Resp:  [20-53] 35 (11/25 1300) BP: (45-176)/(19-98) 97/57 (11/25 0800) SpO2:  [45 %-100 %] 67 % (11/25 1300) Arterial Line BP: (73-126)/(25-55) 98/39 (11/25 1300) FiO2 (%):  [40 %-100 %] 40 % (11/25 1235) Last BM Date: 08/09/17  Weight change: Filed Weights   07/22/2017 1728  Weight: 58.5 kg (129 lb)    Intake/Output:   Intake/Output Summary (Last 24 hours) at 08/10/2017 1419 Last data filed at 08/10/2017 1306 Gross per 24 hour  Intake 10344.99 ml  Output 4428 ml  Net 5916.99 ml      Physical Exam    General:  Intubated sedated HEENT: normal. ETT Neck: supple. JVP . Carotids 2+ bilat; no bruits. No lymphadenopathy or thyromegaly appreciated. Cor: PMI nondisplaced. Regular rate & rhythm. No rubs, gallops or murmurs. Lungs: clear Abdomen: soft, nontender, nondistended.  No hepatosplenomegaly. No bruits or masses. Good bowel sounds. Extremities: no cyanosis, clubbing, rash, edema. Cool left femoral triple lumen  Neuro: alert & orientedx3, cranial nerves grossly intact. moves all 4 extremities w/o difficulty. Affect pleasant   Telemetry   Sinus tach 120-130s. Personally reviewed   EKG    NSR 92 No ST-T wave abnormalities. Personally reviewed     Labs   Basic Metabolic Panel: Recent Labs  Lab 08/09/17 1110 08/09/17 2108 08/10/17 0029 08/10/17 0500 08/10/17 1100  NA 129* 126* 131* 127* 133*  K 3.4* 4.7 4.4 5.0 3.8  CL 93* 98* 102 100* 116*  CO2 22 18* 21* 11* <7*  GLUCOSE 217* 240* 195* 286* 170*  BUN 59* 66* 59* 62* 45*  CREATININE 3.89* 4.29* 3.70* 3.61* 2.47*  CALCIUM 7.6* 6.6* 8.9 6.6* 4.3*  Liver Function Tests: Recent Labs  Lab 07/17/2017 1830 08/10/17 1100  AST 105* 2,520*  ALT 101* 629*  ALKPHOS 182* 225*  BILITOT 1.8* 1.4*  PROT 8.3* <3.0*  ALBUMIN 3.8 1.0*   No results for input(s): LIPASE, AMYLASE in the last 168 hours. No results for input(s): AMMONIA in the last 168 hours.  CBC: Recent Labs  Lab 07/25/2017 1830 08/10/17 0029 08/10/17 0500  WBC 13.7* 3.9* 1.6*  NEUTROABS 12.0*  --   --   HGB 13.5 9.0* 11.1*  HCT 38.7 26.4* 32.3*  MCV 72.1* 74.2* 74.1*  PLT 289 170 174    Cardiac Enzymes: Recent Labs  Lab 08/10/17 0029  CKTOTAL 473*    BNP: BNP (last 3 results) No results for input(s): BNP in the last 8760 hours.  ProBNP (last 3 results) No results for input(s): PROBNP in the last 8760 hours.   CBG: Recent Labs  Lab 08/10/17 0625 08/10/17 0746 08/10/17 1245 08/10/17 1300 08/10/17 1317  GLUCAP 146* 111* 40* 61* 98    Coagulation Studies: Recent Labs    08/10/17 0029  LABPROT 16.8*  INR 1.37     Imaging   Ct Head Wo Contrast  Result Date: 08/10/2017 CLINICAL DATA:  Altered level of consciousness. Arrest. On ventilator. EXAM: CT HEAD WITHOUT CONTRAST TECHNIQUE: Contiguous  axial images were obtained from the base of the skull through the vertex without intravenous contrast. COMPARISON:  03/08/2017 FINDINGS: Brain: No evidence of acute infarction, hemorrhage, hydrocephalus, extra-axial collection or mass lesion/mass effect. Vascular: No hyperdense vessel or unexpected calcification. Skull: Normal. Negative for fracture or focal lesion. Sinuses/Orbits: No acute finding. 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 IMPRESSION: No acute intracranial abnormalities. Electronically Signed   By: William  Stevens M.D.   On: 08/10/2017 06:15   Dg Chest Port 1 View  Result Date: 08/10/2017 CLINICAL DATA:  Endotracheal tube placement post CPR. EXAM: PORTABLE CHEST 1 VIEW COMPARISON:  07/22/2017 FINDINGS: Interval placement of an endotracheal tube with tip measuring 4.6 cm above the carina. An enteric tube is been placed. Tip is off the field of view but below the left hemidiaphragm. Shallow inspiration with linear atelectasis in the left lung base. Heart size and pulmonary vascularity are normal. No pneumothorax. Mediastinal contours appear intact although patient rotation limits examination. IMPRESSION: Appliances appear in satisfactory location. Shallow inspiration with atelectasis in the left lung base. Electronically Signed   By: William  Stevens M.D.   On: 08/10/2017 00:53      Medications:     Current Medications: . chlorhexidine gluconate (MEDLINE KIT)  15 mL Mouth Rinse BID  . fentaNYL (SUBLIMAZE) injection  50 mcg Intravenous Once  . heparin subcutaneous  5,000 Units Subcutaneous Q8H  . hydrocortisone sodium succinate  50 mg Intravenous Q6H  . insulin aspart  0-15 Units Subcutaneous Q4H  . mouth rinse  15 mL Mouth Rinse QID  . pantoprazole (PROTONIX) IV  40 mg Intravenous Daily     Infusions: . sodium chloride    . cefTAZidime (FORTAZ)  IV Stopped (08/10/17 0110)  . fentaNYL infusion INTRAVENOUS 100 mcg/hr (08/10/17 1132)  .  milrinone 0.25 mcg/kg/min (08/10/17 1217)  . norepinephrine (LEVOPHED) Adult infusion 100 mcg/min (08/10/17 1315)  .  sodium bicarbonate  infusion 1000 mL 100 mL/hr at 08/10/17 1357  . [START ON 08/03/2017] vancomycin    . vasopressin (PITRESSIN) infusion - *FOR SHOCK* 0.03 Units/min (08/10/17 0800)       Patient Profile   Stephanie Huber   is a 76 yr old woman with DMI, CKD, HTN admitted 11/24 with hyperosmolar coma and severe cardiogenic shock.  Assessment/Plan   1. 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 Acute respiratory failure due to #1 - vent per CCM  3. AKI - baseline creatinine in 6/18 was 0.5.  - peak 3.6. Now 2.5  4. DM1with DKA - on insulin gtt per CCM   5. Shock liver - due to #1  6. DNR  She is critically ill with MSOF in setting of DKA and profound cardiogenic shock. BP marginal on high-dose pressors. Long talk with multiple family members who confirm that she has been struggling lately but that she has not expressed any thoughts about life not being worth living or wanting to give up.   Thus, despite limited QOL recently, they would like to continue aggressive care up to and including [facility] placement and possible IABP. We discussed the fact that she would not be candidate for ECMO, durable LVAD or transplant given her history. Given severe RV failure, her size and probable diabetic vessels suspect Impella also not feasible.   They agree that if she were to arrest again they would not want CPR on other aggressive measures  CRITICAL CARE Performed by: Glori Bickers  Total critical care time: 60 minutes  Critical care time was exclusive of separately billable procedures and treating other patients.  Critical care was necessary to treat or prevent imminent or life-threatening deterioration.  Critical care was time spent personally by me (independent of midlevel providers or residents) on  the following activities: development of treatment plan with patient and/or surrogate as well as nursing, discussions with consultants, evaluation of patient's response to treatment, examination of patient, obtaining history from patient or surrogate, ordering and performing treatments and interventions, ordering and review of laboratory studies, ordering and review of radiographic studies, pulse oximetry and re-evaluation of patient's condition.  Length of Stay: 2  Glori Bickers, MD  08/10/2017, 2:20 PM  Advanced Heart Failure Team Pager [phone] (M-F; 7a - 4p)  Please contact [facility] for night-coverage after hours (4p -7a ) and weekends on [email or web address]

## 2017-08-10 NOTE — Progress Notes (Signed)
Called for order adjustments to manage low bp.  Adjustments made.

## 2017-08-10 NOTE — Procedures (Signed)
Intubation Procedure Note Stephanie Huber [id number] Jan 28, 1966  Procedure: Intubation Indications: Respiratory insufficiency  Procedure Details Consent: Unable to obtain consent because of altered level of consciousness. Time Out: Verified patient identification, verified procedure, site/side was marked, verified correct patient position, special equipment/implants available, medications/allergies/relevent history reviewed, required imaging and test results available.  Performed  Maximum sterile technique was used including gloves and mask.  3 First attempt 7.5 ETT   Evaluation Hemodynamic Status: BP stable throughout; O2 sats: stable throughout Patient's Current Condition: stable Complications: No apparent complications Patient did tolerate procedure well. Chest X-ray ordered to verify placement.  CXR: pending.   Ezzard StandingWael Z Aljishi 08/10/2017

## 2017-08-10 NOTE — Progress Notes (Signed)
CRITICAL VALUE ALErt Critical Value:  Calcium 4.5  Date & Time Notied:  08/10/2017 1330  Provider Notified: [facility]  Orders Received/Actions taken: No new orders currently.

## 2017-08-10 NOTE — Progress Notes (Addendum)
PULMONARY / CRITICAL CARE MEDICINE   Name: Stephanie Huber MRN: [id number] DOB: 02-03-1966    ADMISSION DATE:  07/18/2017 CONSULTATION DATE:  08/07/2017 REFERRING MD: ED  CHIEF COMPLAINT:  Weakness   HISTORY OF PRESENT ILLNESS:   Stephanie Huber is a 52 yr old lady with DMI, CKD, HTN coming in with altered mental status and as per cousin she has not been taking her insulin and has been feeling weak and sick. No fever chills or rigors. No chest pain or focal weakness.   Patient was found to have glucose of ~2600 and started on insulin drip.   Subjective:  Progressive shock, ETT, resp failure then short PEA arrest Refractory shock, levophed 80  VITAL SIGNS: BP (!) 97/57   Pulse (!) 39   Temp 97.8 F (36.6 C) (Oral)   Resp (!) 40   Ht '5\' 5"'$  (1.651 m)   Wt 58.5 kg (129 lb)   SpO2 (!) 74%   BMI 21.47 kg/m   HEMODYNAMICS:    VENTILATOR SETTINGS: Vent Mode: PRVC FiO2 (%):  [40 %-100 %] 40 % Set Rate:  [20 bmp] 20 bmp Vt Set:  [450 mL] 450 mL PEEP:  [5 cmH20] 5 cmH20 Plateau Pressure:  [17 cmH20-20 cmH20] 17 cmH20  INTAKE / OUTPUT: I/O last 3 completed shifts: In: 14858.4 [I.V.:6699.5; IV Piggyback:8158.9] Out: 3903 [WFUXN:2355; Emesis/NG output:2350]  PHYSICAL EXAMINATION: General: rass -3 Neuro: not fc, not moving ext, perrl HEENT: jvd wnl now, ett PULM: CTA anterior CV:  s1 s2 RRT 130 GI: soft, bs wnl, no r/g Extremities:  No edema, levido fingers, pulses present   LABS:  BMET Recent Labs  Lab 08/09/17 2108 08/10/17 0029 08/10/17 0500  NA 126* 131* 127*  K 4.7 4.4 5.0  CL 98* 102 100*  CO2 18* 21* 11*  BUN 66* 59* 62*  CREATININE 4.29* 3.70* 3.61*  GLUCOSE 240* 195* 286*    Electrolytes Recent Labs  Lab 08/09/17 2108 08/10/17 0029 08/10/17 0500  CALCIUM 6.6* 8.9 6.6*    CBC Recent Labs  Lab 07/27/2017 1830 08/10/17 0029 08/10/17 0500  WBC 13.7* 3.9* 1.6*  HGB 13.5 9.0* 11.1*  HCT 38.7 26.4* 32.3*  PLT 289 170 174    Coag's Recent  Labs  Lab 08/10/17 0029  APTT 34  INR 1.37    Sepsis Markers Recent Labs  Lab 08/10/17 0029 08/10/17 0642  LATICACIDVEN 4.9* 6.5*    ABG Recent Labs  Lab 08/10/17 0045  PHART 7.338*  PCO2ART 33.8  PO2ART 396*    Liver Enzymes Recent Labs  Lab 07/18/2017 1830  AST 105*  ALT 101*  ALKPHOS 182*  BILITOT 1.8*  ALBUMIN 3.8    Cardiac Enzymes No results for input(s): TROPONINI, PROBNP in the last 168 hours.  Glucose Recent Labs  Lab 08/09/17 1629 08/09/17 2114 08/09/17 2243 08/10/17 0450 08/10/17 0625 08/10/17 0746  GLUCAP 111* 235* 173* 136* 146* 111*    Imaging Ct Head Wo Contrast  Result Date: 08/10/2017 CLINICAL DATA:  Altered level of consciousness. Arrest. On ventilator. EXAM: CT HEAD WITHOUT CONTRAST TECHNIQUE: Contiguous axial images were obtained from the base of the skull through the vertex without intravenous contrast. COMPARISON:  03/08/2017 FINDINGS: Brain: No evidence of acute infarction, hemorrhage, hydrocephalus, extra-axial collection or mass lesion/mass effect. Vascular: No hyperdense vessel or unexpected calcification. Skull: Normal. Negative for fracture or focal lesion. Sinuses/Orbits: No acute finding. 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 IMPRESSION: No acute intracranial abnormalities. Electronically  Signed   By: Lucienne Capers M.D.   On: 08/10/2017 06:15   Dg Chest Port 1 View  Result Date: 08/10/2017 CLINICAL DATA:  Endotracheal tube placement post CPR. EXAM: PORTABLE CHEST 1 VIEW COMPARISON:  07/22/2017 FINDINGS: Interval placement of an endotracheal tube with tip measuring 4.6 cm above the carina. An enteric tube is been placed. Tip is off the field of view but below the left hemidiaphragm. Shallow inspiration with linear atelectasis in the left lung base. Heart size and pulmonary vascularity are normal. No pneumothorax. Mediastinal contours appear intact although patient rotation limits examination.  IMPRESSION: Appliances appear in satisfactory location. Shallow inspiration with atelectasis in the left lung base. Electronically Signed   By: Lucienne Capers M.D.   On: 08/10/2017 00:53     ASSESSMENT / PLAN:  - Hyperosmolar non ketotic acidosis -too rapid rise Na? With correction not true - metabolic encephalopathy - acute kidney injury -hypokalemia -11/24 small AG 16, met alk - severe hyponatremia  - acute transaminitis  - non compliance - suspected depression  - hypocalcemia -psychiatric dz? 11/24 progressive shock, resp failure Post intubation arrest- PEA 5 min   Plan:  RESP: -no well defined infiltrates, slight hazziness left, mild ede,a -ett well placed -ABG reviewed , keep same MV, and can lower to min O2 support given pao2 400 -pcxr in am  -no weaning with shock state  CVS: Refractory shock rule out hypovolemic, cardiogenic post arrest, vasoplegia -stat echo -attempted line failed by prior pccm -may consider cvp -lactic rise, post arrest, bolus again x500 then stop, she is 11 liters pos last 24 hours -Cortisol, then empiric roids for now -levophed to map goal, no role weak neo in this stat- dc, can take levo to 100 if needed -dc prop with this kinda shock state -vaso maintain  RENAL ARF, ATN, ALctic acid post arrest Huge NONAG Rule out 4 rta also hyponatremia -may need bicarb consideration for K slight up and NONAG -chem q8h  GI -high shock state -no feeds -LDH -ppi ensure -get lft  ID: No infection evident- concern with leukopenia ? Empiric abx maintain Echo for veg Ensure BC sent pcxr in am  HIV  HEME: leukopenia -HIV Sub q hep Cbc in am  scd  ENDO -DKA, HONK improved tsh Cortisol Get stress roids ssi  NEURO: Post arrest at risk hypoxia Acute met enceph -dc prop -ct head neg edema from na changes -max fentanyl use -prn versed -get drug screen  WIll meet with family now and discuss code status  Ccm time 85  min  Lavon Paganini. Titus Mould, MD, [place] Pgr: [facility]  Pulmonary and [place] Pager: [phone]  08/10/2017, 10:04 AM   I have had extensive discussions with family aunt. We discussed patients current circumstances and organ failures. We also discussed patient's prior wishes under circumstances such as this. Family has decided to NOT perform resuscitation if arrest but to continue current medical support for now.    I reviewed echo, unofficial, EF is down and dyskinetic, this appears to be cardiogenic shock and she coded after ETT and pos pressure because of preload reduction in this setting. Add milrinone for now, avoid dobut with tachy for now Consider swan and or mechanica l support,will consult chf and move to [facility]. Neck was attempted access by PCCM and no wire pass bilateral. So swan if placed would be fem , flouro by CHF  Lavon Paganini. Titus Mould, MD, [place] Pgr: [phone]  [facility]

## 2017-08-10 NOTE — Progress Notes (Signed)
CRITICAL VALUE ALERT  Critical Value:  Lactic 4.9  Date & Time Notied:  0128  Provider Notified: Elink physician  Orders Received/Actions taken: no new orders

## 2017-08-10 NOTE — Progress Notes (Signed)
Progress  Appreciate Excellent care from Dr benshimon Biventribualr failure, likely from metabolic / sepsis related Max levo 100, max epi 15 Acidotic on max minute ventilation  Xray pcxr after swan with possible free air under daiphargms  This is not survivable She would not be a candidate for any operation Would never survive that with above pressors and vent needs, lactic 14 on rise Will add antifungals empiric Continued support No CT indicated as would not change outcome Spoke to aunt and family. They agree and understand and appreciate Care being provided. They agree not CT or surgical assessments  Mcarthur RossettiDaniel J. Tyson AliasFeinstein, MD, FACP Pgr: [phone] [facility]

## 2017-08-10 NOTE — Plan of Care (Signed)
Patients hemodynamic status was critical through most of night requiring additional medical support to stabilize.  Respiratory status was in question and patient intubated soon after PEA arrest requiring resuscitation and recovered. Patient at this point remains stable on Levo, Neo, and vasopressin.

## 2017-08-10 NOTE — Progress Notes (Signed)
Patient condition was deteriorating CVC inserted and patient was intubated. Shortly after seh went into PEA arrest and ACLS was started. Regained pulse after 2 rounds of epi. Multiple NaHCO3. Please refer to code sheet.  Now on levophed, vasopressin and phenylephrine. CT head ordered for when she is more stable. Labs are pending.   Discussed with her POA her aunt and explained how sick she is and her very critical condition. She is calling the family to update them.

## 2017-08-10 NOTE — Progress Notes (Signed)
   08/10/17 1800  Clinical Encounter Type  Visited With Patient and family together;Health care provider  Visit Type Critical Care  Referral From Nurse  Consult/Referral To Chaplain  Spiritual Encounters  Spiritual Needs Emotional;Prayer  Stress Factors  Family Stress Factors Major life changes   Responded to a page from [facility] for this patient.  Was asked to come support the family.  Patient was having a procedure done and condition of the patient was uncertain.  Offered support to the family.  Came back 30 minutes later and the patient's health has declined.  Stayed with family at bedside and we all prayed together.  Offered support and hospitality.  Will continue to follow.  Clergy from their church is on the way. Chaplain Agustin CreeNewton Mykira Hofmeister

## 2017-08-10 NOTE — Procedures (Signed)
Pulmonary Artery Catheter Insertion Procedure Note Stephanie Huber [id number] 17-Oct-1965  Procedure: Insertion of Pulmonary Artery Catheter Indications: Cardiogenic shock  Procedure Details Consent: Risks of procedure as well as the alternatives and risks of each were explained to the (patient/caregiver).  Consent for procedure obtained. Time Out: Verified patient identification, verified procedure, site/side was marked, verified correct patient position, special equipment/implants available, medications/allergies/relevent history reviewed, required imaging and test results available.  Performed  Maximum sterile technique was used including antiseptics, cap, gloves, gown, hand hygiene, mask and sheet. Skin prep: Chlorhexidine; local anesthetic administered A antimicrobial bonded/coated single lumen  Sheath was placed in the right internal jugular vein using the Seldinger technique. A Swan-Ganz catheter was then placed through the sheath and navigated into the pulmonary artery using pressure waveform guiadance  Evaluation Blood flow good Complications: No apparent complications Patient did tolerate procedure well. Chest X-ray ordered to verify placement.  CXR: pending.  Arvilla Meresaniel Jerimey Burridge MD 08/10/2017, 4:19 PM

## 2017-08-11 ENCOUNTER — Inpatient Hospital Stay (HOSPITAL_COMMUNITY): Payer: Medicare Other

## 2017-08-11 DIAGNOSIS — E1101 Type 2 diabetes mellitus with hyperosmolarity with coma: Secondary | ICD-10-CM

## 2017-08-11 LAB — CBC WITH DIFFERENTIAL/PLATELET
BASOS ABS: 0 10*3/uL (ref 0.0–0.1)
BASOS PCT: 1 %
Eosinophils Absolute: 0 10*3/uL (ref 0.0–0.7)
Eosinophils Relative: 1 %
HCT: 27.8 % — ABNORMAL LOW (ref 36.0–46.0)
HEMATOCRIT: 28.1 % — AB (ref 36.0–46.0)
HEMOGLOBIN: 8.8 g/dL — AB (ref 12.0–15.0)
Hemoglobin: 8.8 g/dL — ABNORMAL LOW (ref 12.0–15.0)
LYMPHS PCT: 31 %
Lymphs Abs: 1.2 10*3/uL (ref 0.7–4.0)
MCH: 25.7 pg — ABNORMAL LOW (ref 26.0–34.0)
MCH: 26 pg (ref 26.0–34.0)
MCHC: 31.3 g/dL (ref 30.0–36.0)
MCHC: 31.7 g/dL (ref 30.0–36.0)
MCV: 81.9 fL (ref 78.0–100.0)
MCV: 82 fL (ref 78.0–100.0)
MONOS PCT: 6 %
Monocytes Absolute: 0.2 10*3/uL (ref 0.1–1.0)
NEUTROS ABS: 2.4 10*3/uL (ref 1.7–7.7)
NEUTROS PCT: 61 %
Platelets: 65 10*3/uL — ABNORMAL LOW (ref 150–400)
RBC: 3.43 MIL/uL — AB (ref 3.87–5.11)
RBC: 3.39 MIL/uL — ABNORMAL LOW (ref 3.87–5.11)
RDW: 13.7 % (ref 11.5–15.5)
RDW: 13.8 % (ref 11.5–15.5)
WBC: 3.8 10*3/uL — ABNORMAL LOW (ref 4.0–10.5)

## 2017-08-11 LAB — POCT I-STAT 3, ART BLOOD GAS (G3+)
ACID-BASE DEFICIT: 25 mmol/L — AB (ref 0.0–2.0)
BICARBONATE: 5.3 mmol/L — AB (ref 20.0–28.0)
O2 Saturation: 98 %
PCO2 ART: 23.9 mmHg — AB (ref 32.0–48.0)
PH ART: 6.962 — AB (ref 7.350–7.450)
PO2 ART: 166 mmHg — AB (ref 83.0–108.0)
Patient temperature: 38.5
TCO2: 6 mmol/L — ABNORMAL LOW (ref 22–32)

## 2017-08-11 LAB — GLUCOSE, CAPILLARY
GLUCOSE-CAPILLARY: 345 mg/dL — AB (ref 65–99)
Glucose-Capillary: 357 mg/dL — ABNORMAL HIGH (ref 65–99)

## 2017-08-11 LAB — COMPREHENSIVE METABOLIC PANEL
ALK PHOS: 448 U/L — AB (ref 38–126)
ALT: 3268 U/L — AB (ref 14–54)
Albumin: <1 g/dL — ABNORMAL LOW (ref 3.5–5.0)
Anion gap: 26 — ABNORMAL HIGH (ref 5–15)
BILIRUBIN TOTAL: 2 mg/dL — AB (ref 0.3–1.2)
BUN: 51 mg/dL — AB (ref 6–20)
CO2: 8 mmol/L — ABNORMAL LOW (ref 22–32)
CREATININE: 3.93 mg/dL — AB (ref 0.44–1.00)
Calcium: 5.5 mg/dL — CL (ref 8.9–10.3)
Chloride: 91 mmol/L — ABNORMAL LOW (ref 101–111)
GFR calc Af Amer: 14 mL/min — ABNORMAL LOW (ref >60)
GFR, EST NON AFRICAN AMERICAN: 12 mL/min — AB (ref >60)
Glucose, Bld: 400 mg/dL — ABNORMAL HIGH (ref 65–99)
Potassium: 6.1 mmol/L — ABNORMAL HIGH (ref 3.5–5.1)
Sodium: 125 mmol/L — ABNORMAL LOW (ref 135–145)
Total Protein: <2 g/dL — ABNORMAL LOW (ref 6.5–8.1)

## 2017-08-11 LAB — PROCALCITONIN

## 2017-08-11 LAB — HIV ANTIBODY (ROUTINE TESTING W REFLEX): HIV Screen 4th Generation wRfx: NONREACTIVE

## 2017-08-11 MED ORDER — SODIUM CHLORIDE 0.9 % IV SOLN
Freq: Once | INTRAVENOUS | Status: AC
  Administered 2017-08-11: 999 mL/h via INTRAVENOUS

## 2017-08-11 MED ORDER — SODIUM CHLORIDE 0.9 % IV BOLUS (SEPSIS): 500.0000 mL | Freq: Once | INTRAVENOUS | Status: AC

## 2017-08-12 LAB — LACTIC ACID, PLASMA: Lactic Acid, Venous: >30 mmol/L (ref 0.5–1.9)

## 2017-08-14 LAB — CULTURE, BLOOD (ROUTINE X 2): Special Requests: ADEQUATE

## 2017-08-15 LAB — CULTURE, BLOOD (ROUTINE X 2): Culture: NO GROWTH

## 2017-08-16 NOTE — Progress Notes (Addendum)
Pt passed away with Aunt Rosetta and myself at beside at 1120am Sep 30, 2016

## 2017-08-16 NOTE — Progress Notes (Signed)
As family members learn and come to terms with this death, one ran out of the room and needed a moment.  Chaplain supported and followed her until she returned to the room.  She seems calmer and has been told to stay in room or out in waiting room.  Chaplain would like to thank medical team and [facility] staff for their support and care for this patient and family.

## 2017-08-16 NOTE — Progress Notes (Signed)
Responding to page for the family of this patient who has passed away.  Family appreciative of Chaplain support.  There are several family members that will be made aware of the death of this patient and once all gather we will pray together per aunt's request.  Chaplain providing hospitality and grief and emotional support.    08/02/2017 1204  Clinical Encounter Type  Visited With Family;Health care provider  Visit Type Follow-up;Spiritual support;Social support;Death  Spiritual Encounters  Spiritual Needs Grief support;Emotional;Prayer

## 2017-08-16 NOTE — Progress Notes (Signed)
PULMONARY / CRITICAL CARE MEDICINE   Name: Stephanie Huber MRN: [id number] DOB: December 01, 1965    ADMISSION DATE:  04/06/2017 CONSULTATION DATE:  04/06/2017 REFERRING MD: ED  CHIEF COMPLAINT:  Weakness   HISTORY OF PRESENT ILLNESS:   Stephanie Huber is a 51 yr old lady with DMI, CKD, HTN coming in with altered mental status and as per cousin she has not been taking her insulin and has been feeling weak and sick. No fever chills or rigors. No chest pain or focal weakness.   Patient was found to have glucose of ~2600 and started on insulin drip.   Dr. Clarise CruzBenshimon saw the patient this AM and felt patient was improving.  However, RN called with ABG results, pH is 6.98 mostly metabolic, virtually maxed out on all pressors.  Completely unresponsive on exam.  I see no reasonable chance at improvement here.  Team communicated with family, expressed severe concern for survival and will likely expire.  Confirmed DNR status.  Shortly after the patient became asystolic.  Attempting to confirm if a medical examiner case.  The patient is critically ill with multiple organ systems failure and requires high complexity decision making for assessment and support, frequent evaluation and titration of therapies, application of advanced monitoring technologies and extensive interpretation of multiple databases.   Critical Care Time devoted to patient care services described in this note is  35  Minutes. This time reflects time of care of this signee Dr Koren BoundWesam Huber. This critical care time does not reflect procedure time, or teaching time or supervisory time of PA/NP/Med student/Med Resident etc but could involve care discussion time.  Stephanie Huber, M.D. [facility]. Pager: [phone]. After hours pager: [phone].

## 2017-08-16 NOTE — Progress Notes (Signed)
Advanced Heart Failure Rounding Note   Subjective:     Sedated on vent. On max dose pressors.  NE 100 Epi 20 vaso 0.03 and milrinone 0.25  SBP now 130 (was lower overnight)  Made 125cc urine the last hour. Labs pending   Swan #s RA 8 PA 18/12 (15) PCWP 7 Thermo 4.4/2.5  Objective:   Weight Range:  Vital Signs:   Temp:  [93.6 F (34.2 C)-102.8 F (39.3 C)] 98.2 F (36.8 C) (11/26 0836) Pulse Rate:  [35-152] 112 (11/26 0816) Resp:  [15-40] 15 (11/26 0836) BP: (69-99)/(36-66) 99/36 (11/26 0816) SpO2:  [63 %-99 %] 65 % (11/25 2230) Arterial Line BP: (81-126)/(39-55) 98/39 (11/25 1300) FiO2 (%):  [40 %] 40 % (11/26 0816) Weight:  [66.8 kg (147 lb 4.3 oz)-71.8 kg (158 lb 4.6 oz)] 71.8 kg (158 lb 4.6 oz) (11/26 0500) Last BM Date: 08/09/17  Weight change: Filed Weights   08/10/17 1452 08/10/17 2300 09/08/17 0500  Weight: 66.8 kg (147 lb 4.3 oz) 66.8 kg (147 lb 4.3 oz) 71.8 kg (158 lb 4.6 oz)    Intake/Output:   Intake/Output Summary (Last 24 hours) at 09-08-17 0848 Last data filed at 09-08-17 0703 Gross per 24 hour  Intake 7950.31 ml  Output 2745 ml  Net 5205.31 ml     Physical Exam: General:  Critically ill appearing. Sedated and nonresponsive on vent HEENT: normal ETT Neck: supple. RIJ swan Carotids 2+ bilat; no bruits. No lymphadenopathy or thryomegaly appreciated. Cor: PMI nondisplaced. Tachy regular Lungs: clear Abdomen: soft, nontender, nondistended. No hepatosplenomegaly. No bruits or masses. Good bowel sounds. Extremities: no cyanosis, clubbing, rash, trace edema Neuro: alert & orientedx3, cranial nerves grossly intact. moves all 4 extremities w/o difficulty. Affect pleasant  Telemetry: Sinus tach 120 Personally reviewed   Labs: Basic Metabolic Panel: Recent Labs  Lab 08/09/17 1110 08/09/17 2108 08/10/17 0029 08/10/17 0500 08/10/17 1100 08/10/17 1645  NA 129* 126* 131* 127* 133* 128*  K 3.4* 4.7 4.4 5.0 3.8 5.2*  CL 93* 98* 102  100* 116* 98*  CO2 22 18* 21* 11* <7*  --   GLUCOSE 217* 240* 195* 286* 170* 384*  BUN 59* 66* 59* 62* 45* 52*  CREATININE 3.89* 4.29* 3.70* 3.61* 2.47* 3.40*  CALCIUM 7.6* 6.6* 8.9 6.6* 4.3*  --     Liver Function Tests: Recent Labs  Lab 08/07/2017 1830 08/10/17 1100  AST 105* 2,520*  ALT 101* 629*  ALKPHOS 182* 225*  BILITOT 1.8* 1.4*  PROT 8.3* <3.0*  ALBUMIN 3.8 1.0*   No results for input(s): LIPASE, AMYLASE in the last 168 hours. No results for input(s): AMMONIA in the last 168 hours.  CBC: Recent Labs  Lab 07/21/2017 1830 08/10/17 0029 08/10/17 0500 08/10/17 1645  WBC 13.7* 3.9* 1.6*  --   NEUTROABS 12.0*  --   --   --   HGB 13.5 9.0* 11.1* 9.9*  HCT 38.7 26.4* 32.3* 29.0*  MCV 72.1* 74.2* 74.1*  --   PLT 289 170 174  --     Cardiac Enzymes: Recent Labs  Lab 08/10/17 0029  CKTOTAL 473*    BNP: BNP (last 3 results) No results for input(s): BNP in the last 8760 hours.  ProBNP (last 3 results) No results for input(s): PROBNP in the last 8760 hours.    Other results:  Imaging: Ct Head Wo Contrast  Result Date: 08/10/2017 CLINICAL DATA:  Altered level of consciousness. Arrest. On ventilator. EXAM: CT HEAD WITHOUT CONTRAST TECHNIQUE: Contiguous axial  images were obtained from the base of the skull through the vertex without intravenous contrast. COMPARISON:  03/08/2017 FINDINGS: Brain: No evidence of acute infarction, hemorrhage, hydrocephalus, extra-axial collection or mass lesion/mass effect. Vascular: No hyperdense vessel or unexpected calcification. Skull: Normal. Negative for fracture or focal lesion. Sinuses/Orbits: No acute finding. 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 IMPRESSION: No acute intracranial abnormalities. Electronically Signed   By: Lucienne Capers M.D.   On: 08/10/2017 06:15   Dg Chest Port 1 View  Addendum Date: 08/10/2017   ADDENDUM REPORT: 08/10/2017 17:17 ADDENDUM: Findings discussed with Dr.  Titus Mould. Electronically Signed   By: Dorise Bullion III M.D   On: 08/10/2017 17:17   Result Date: 08/10/2017 CLINICAL DATA:  Evaluate central line placement. EXAM: PORTABLE CHEST 1 VIEW COMPARISON:  August 10, 2017 FINDINGS: The ETT terminates in the mid trachea. A new PA catheter terminates in the right pulmonary artery with the distal tip over the right hilum. No pneumothorax. An OG tube terminates below the left hemidiaphragm. Two skin folds are seen over the left chest and upper abdomen. There is lucency projected just inferior to the right hemidiaphragm, not present previously. This is suspicious for free air. The heart, hila, and mediastinum are normal. No nodules or masses. No focal infiltrates. IMPRESSION: 1. Suspected free air under the right hemidiaphragm. If there is not an obvious explanation such as recent surgery, recommend CT imaging of the abdomen and pelvis. 2. The PA catheter is in good position as above. Other support apparatus as above. The findings are being called to the referring physician. Electronically Signed: By: Dorise Bullion III M.D On: 08/10/2017 16:42   Dg Chest Port 1 View  Result Date: 08/10/2017 CLINICAL DATA:  Endotracheal tube placement post CPR. EXAM: PORTABLE CHEST 1 VIEW COMPARISON:  08/01/2017 FINDINGS: Interval placement of an endotracheal tube with tip measuring 4.6 cm above the carina. An enteric tube is been placed. Tip is off the field of view but below the left hemidiaphragm. Shallow inspiration with linear atelectasis in the left lung base. Heart size and pulmonary vascularity are normal. No pneumothorax. Mediastinal contours appear intact although patient rotation limits examination. IMPRESSION: Appliances appear in satisfactory location. Shallow inspiration with atelectasis in the left lung base. Electronically Signed   By: Lucienne Capers M.D.   On: 08/10/2017 00:53      Medications:     Scheduled Medications: . chlorhexidine gluconate  (MEDLINE KIT)  15 mL Mouth Rinse BID  . fentaNYL (SUBLIMAZE) injection  50 mcg Intravenous Once  . heparin subcutaneous  5,000 Units Subcutaneous Q8H  . hydrocortisone sodium succinate  50 mg Intravenous Q6H  . insulin aspart  0-15 Units Subcutaneous Q4H  . mouth rinse  15 mL Mouth Rinse 10 times per day  . pantoprazole (PROTONIX) IV  40 mg Intravenous Daily     Infusions: . sodium chloride 10 mL/hr at Aug 22, 2017 0700  . cefTAZidime (FORTAZ)  IV Stopped (2017/08/22 0000)  . epinephrine 20 mcg/min (2017/08/22 0700)  . fentaNYL infusion INTRAVENOUS 50 mcg/hr (2017-08-22 0817)  . fluconazole (DIFLUCAN) IV Stopped (08/10/17 2109)  . metronidazole 500 mg (08/22/2017 0559)  . milrinone 0.25 mcg/kg/min (08/22/2017 0700)  . norepinephrine (LEVOPHED) Adult infusion 100 mcg/min (2017/08/22 0700)  .  sodium bicarbonate  infusion 1000 mL 100 mL/hr at 2017-08-22 0816  . vancomycin    . vasopressin (PITRESSIN) infusion - *FOR SHOCK* 0.03 Units/min (08/22/17 0700)     PRN Medications:  Place/Maintain arterial  line **AND** sodium chloride, fentaNYL, fentaNYL (SUBLIMAZE) injection, fentaNYL (SUBLIMAZE) injection, midazolam, midazolam   Assessment:    Stephanie Huber is a 51 yr old woman with DMI, CKD, HTN admitted 11/24 with hyperosmolar coma and severe cardiogenic shock.   Plan/Discussion:    1. 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 Await labs this am to reassess end-organ recovery. Neurologic injury also in question.  -Will continue max support for now and continue to reassess throughout he day -Family update at bedside as to ongoing severity of her condition and expectation that she will likely not survive. But we will continue to push as much as we can to help her.   2. Acute respiratory failure due to #1 - vent per  CCM  3. AKI - baseline creatinine in 6/18 was 0.5.  - peak 3.6. Labs pending for this am - starting to make urine - given swan numbers will not give lasix at this time as she is mostly 3rd spacing.   4. DM1with DKA - on insulin gtt per CCM   5. Shock liver - due to #1  6. DNR  CRITICAL CARE Performed by: Glori Bickers  Total critical care time: 45 minutes  Critical care time was exclusive of separately billable procedures and treating other patients.  Critical care was necessary to treat or prevent imminent or life-threatening deterioration.  Critical care was time spent personally by me (independent of midlevel providers or residents) on the following activities: development of treatment plan with patient and/or surrogate as well as nursing, discussions with consultants, evaluation of patient's response to treatment, examination of patient, obtaining history from patient or surrogate, ordering and performing treatments and interventions, ordering and review of laboratory studies, ordering and review of radiographic studies, pulse oximetry and re-evaluation of patient's condition.     Length of Stay: 3   Glori Bickers 28-Aug-2017, 8:48 AM  Advanced Heart Failure Team Pager [phone] (M-F; 7a - 4p)  Please contact [facility] for night-coverage after hours (4p -7a ) and weekends on [email or web address]

## 2017-08-16 NOTE — Progress Notes (Signed)
200ml of fentanyl infusion wasted in sink.  RN Judeth CornfieldStephanie Flippin Witnessed.  Jacqulyn Canehristopher Scott Danyelle Brookover RN, BSN, CCRN

## 2017-08-16 NOTE — Progress Notes (Signed)
Inpatient Diabetes Program Recommendations  AACE/ADA: New Consensus Statement on Inpatient Glycemic Control (2015)  Target Ranges:  Prepandial:   less than 140 mg/dL      Peak postprandial:   less than 180 mg/dL (1-2 hours)      Critically ill patients:  140 - 180 mg/dL   Results for Stephanie JewettRICK, Jasmen D (MRN [id number]) as of 08/02/2017 09:32  Ref. Range 08/10/2017 07:46 08/10/2017 12:45 08/10/2017 13:00 08/10/2017 13:17 08/10/2017 15:13 08/10/2017 20:06 08/10/2017 23:37 08/13/2017 04:02 07/29/2017 08:24  Glucose-Capillary Latest Ref Range: 65 - 99 mg/dL 657111 (H) 40 (LL) 61 (L) 98 344 (H)  Novolog 11 units 355 (H)  Novolog 15 units 345 (H)  Novolog 11 units 345 (H)  Novolog 11 units 357 (H)  Novolog 15 units   Review of Glycemic Control  Diabetes history: DM1 (will require basal, correction, and meal coverage insulin) Outpatient Diabetes medications: Lantus 30 units QHS, Humalog 2-10 units TID with meals Current orders for Inpatient glycemic control: Novolog 0-15 units Q4H; Solucortef 50 mg Q6H  Inpatient Diabetes Program Recommendations: Insulin - Basal: Please consider ordering Lantus 25 units daily starting now. Correction (SSI): Please consider changing to ICU Glycemic Control order set to improve inpatient glycemic control.  Thanks, Orlando PennerMarie Natalina Wieting, RN, MSN, CDE Diabetes Coordinator Inpatient Diabetes Program [phone] (Team Pager from 8am to 5pm)

## 2017-08-16 NOTE — Discharge Summary (Signed)
NAME:  Stephanie Huber, [name]                  ACCOUNT NO.:  MEDICAL RECORD NO.:  [id number]  LOCATION:                                 FACILITY:  PHYSICIAN:  Felipa EvenerWesam Jake Mayer Vondrak, MD  DATE OF BIRTH:  19-Feb-1966  DATE OF ADMISSION: DATE OF DISCHARGE:                              DISCHARGE SUMMARY   DEATH SUMMARY  PRIMARY DIAGNOSIS/CAUSE OF DEATH:  Refractory shock.  SECONDARY DIAGNOSES:  Nonketotic hyperosmolar state, diabetes mellitus, chronic kidney disease, biventricular failure, acute respiratory failure with hypoxemia, hyponatremia, transaminitis, noncompliance with medication, hypocalcemia, metabolic encephalopathy, metabolic acidosis, lactic acidosis, acute renal failure, acute tubular necrosis, non-anion gap metabolic acidosis, and pulseless activity cardiac arrest.  The patient is a 51 year old female with extensive past medical history, presenting after cardiac arrest that was most likely related to severe metabolic derangements in a patient who is a noncompliant diabetic.  The patient was found down by her cousin.  EMS was called and found to be in refractory shock, respiratory failure, pulseless activity cardiac arrest.  [facility] manage after she was noted to have biventricular failure.  The patient was admitted to the coronary care unit and maintained on pressor and vent support.  However, her acidosis and shock became refractory.  The patient continued to deteriorate.  DNR status was confirmed.  The patient was maxed out on multiple pressors with no effect.  Conversation with family confirmed DNR status.  The patient became refractory acidotic, refractory hypotensive and suffered another cardiac arrest at which point no resuscitative efforts were done and the patient was declared dead and condolences were given to family.     Felipa EvenerWesam Jake Briggette Najarian, MD     WJY/MEDQ  D:  07/18/2017  T:   08/09/2017  Job:  [id number]

## 2017-08-16 NOTE — Progress Notes (Signed)
Checked in with family to see if anything was needed and to see how they were fairing.  Family stated that other family members were on their way back and were bringing a family member with them.  Chaplain will defer to family Renato Gailsastor who is also on the way.      08/07/2017 1620  Clinical Encounter Type  Visited With Family  Visit Type Follow-up;Spiritual support   Leontine LocketMartin, [place] Delio G Chaplain

## 2017-08-16 NOTE — Plan of Care (Signed)
Pt requiring full dose pressors (Epi, Vasopressin, Levo and Milrinone) to maintain SBP > 65 r/t poor 2D echo EF of 10-15 5 per MD.  Pt without brain reflexes of gag, cough or ability to [facility]. Extreme emotional support provided to family members, questions answered. Family conference suggested by family in A.M. of 20-Jan-2017 if Pt survives evening.

## 2017-08-16 DEATH — deceased

## 2017-08-21 ENCOUNTER — Telehealth (HOSPITAL_COMMUNITY): Payer: Self-pay | Admitting: *Deleted

## 2017-08-21 NOTE — Telephone Encounter (Signed)
[facility].  Funeral home is aware and will pick up today.

## 2017-12-13 IMAGING — CT CT FEMUR *L* W/ CM
3 of 4 series · 11 of 33 positions shown, 12 images · IV contrast (agent unspecified)
Comparison: None.

CONTRAST:  100 cc Ysovue-QUU

CLINICAL DATA: Abscess.  Pain.

EXAM:
CT OF THE LOWER RIGHT EXTREMITY WITH CONTRAST
TECHNIQUE: Multidetector CT imaging of the lower right extremity was performed
according to the standard protocol following intravenous contrast
administration.

[Series 4: lower ext 1.5 st · axial · 0.43mm/px · z∈[+724,+1055]mm · 3 of 361 slices shown, 4 images]
[im 84/361  soft-tissue]
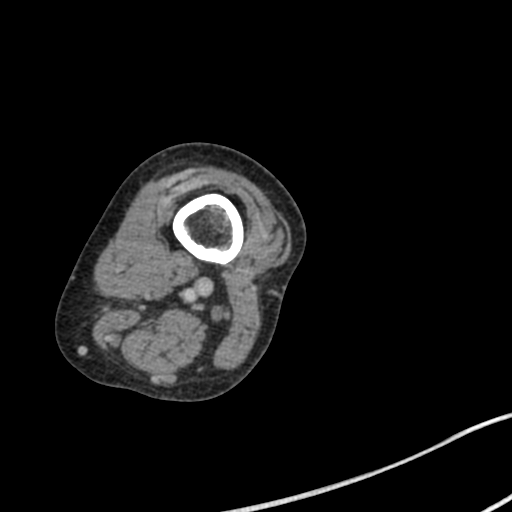
[im 84/361  bone]
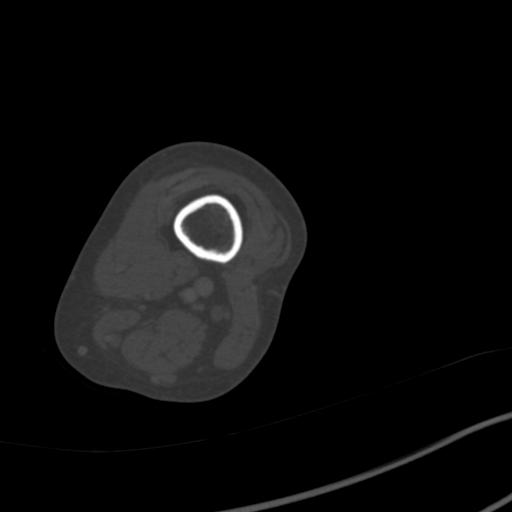
[im 194/361  bone]
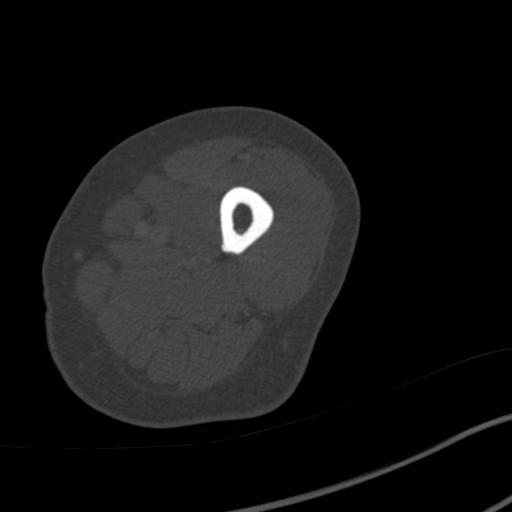
[im 305/361  bone]
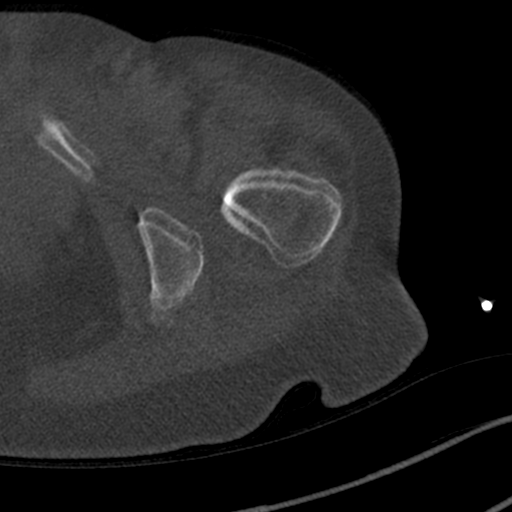

[Series 8: lower ext cor st · coronal · 0.49mm/px · 3 of 167 slices shown]
[im 34/167  bone]
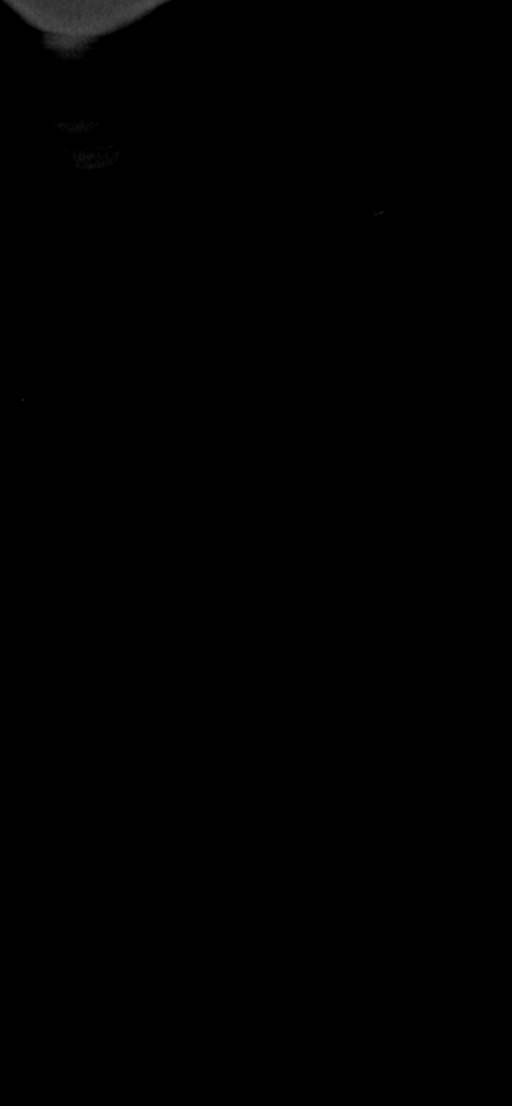
[im 67/167  bone]
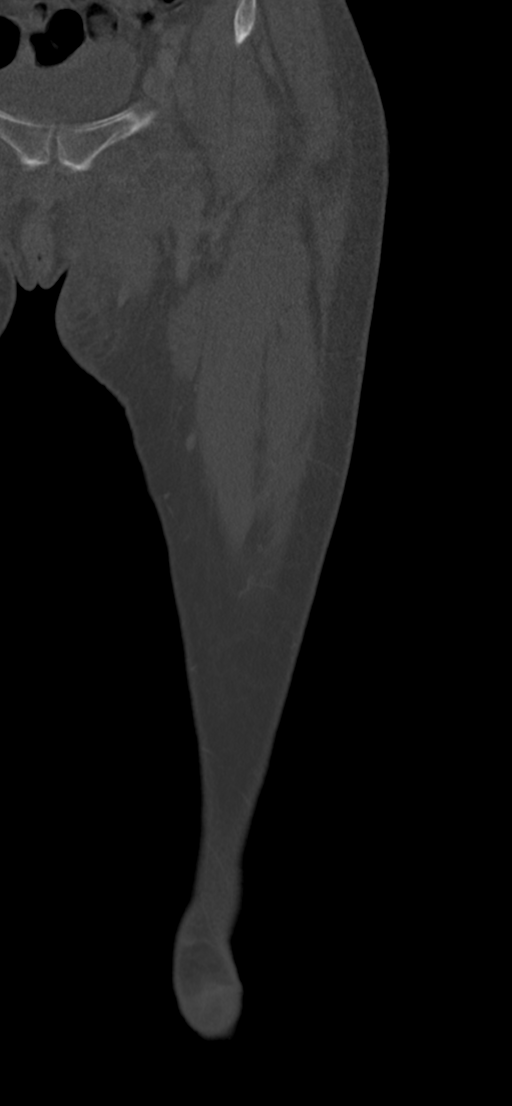
[im 100/167  bone]
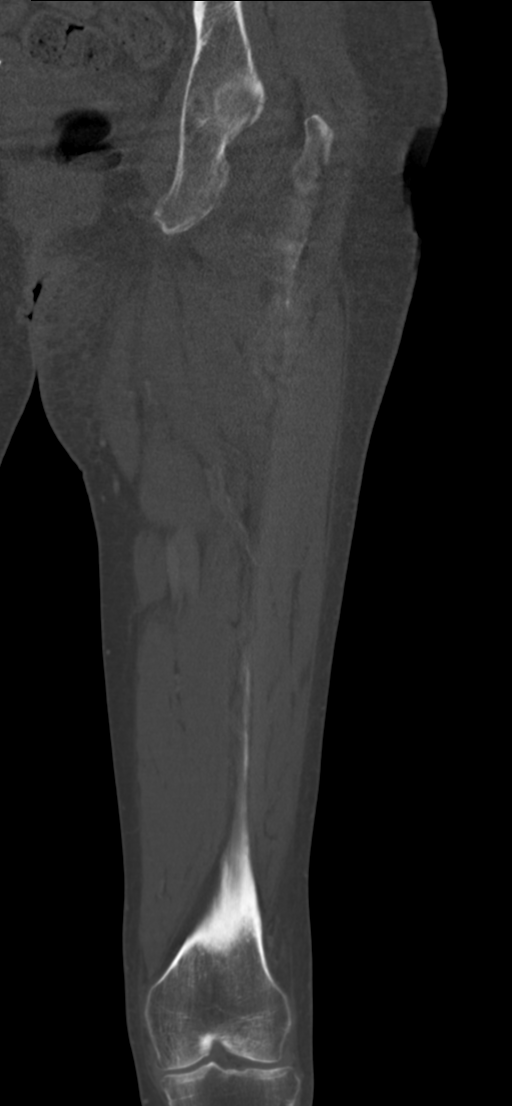

[Series 9: lower ext sag st · sagittal · 0.61mm/px · 5 of 130 slices shown]
[im 19/130  bone]
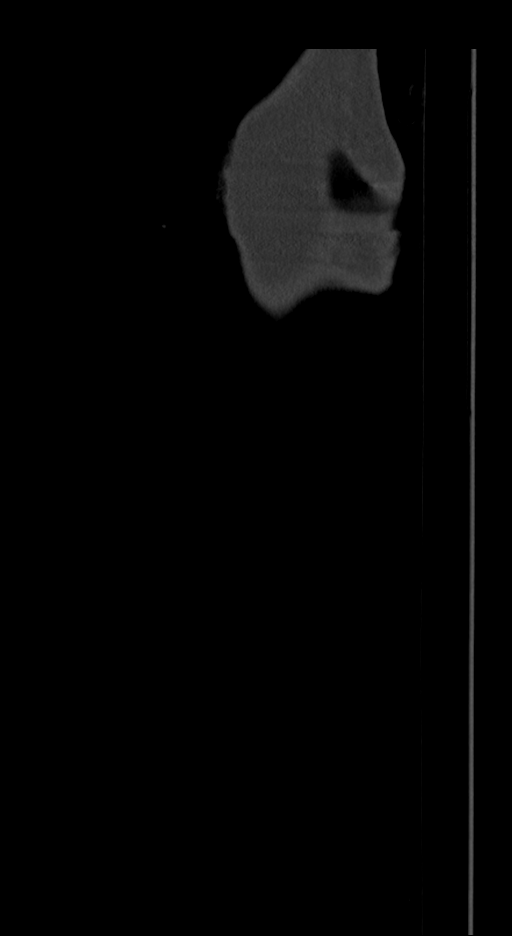
[im 37/130  bone]
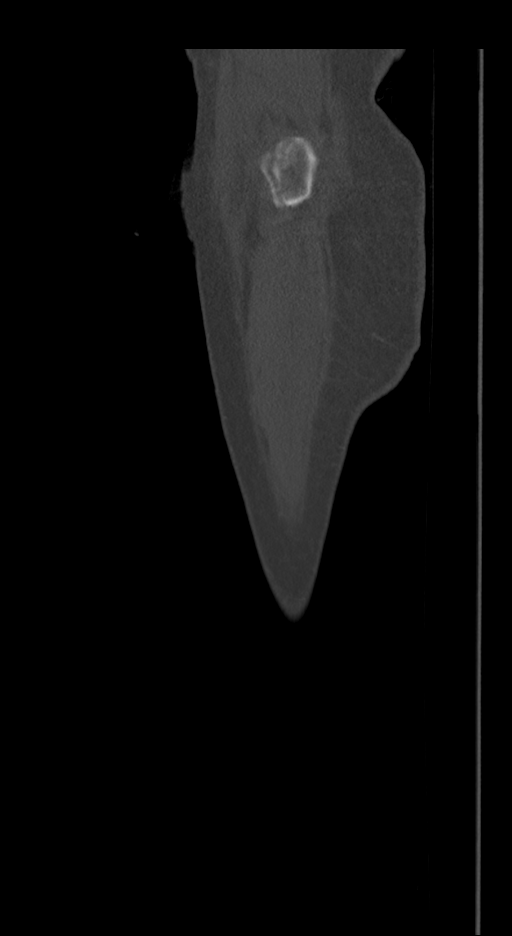
[im 56/130  bone]
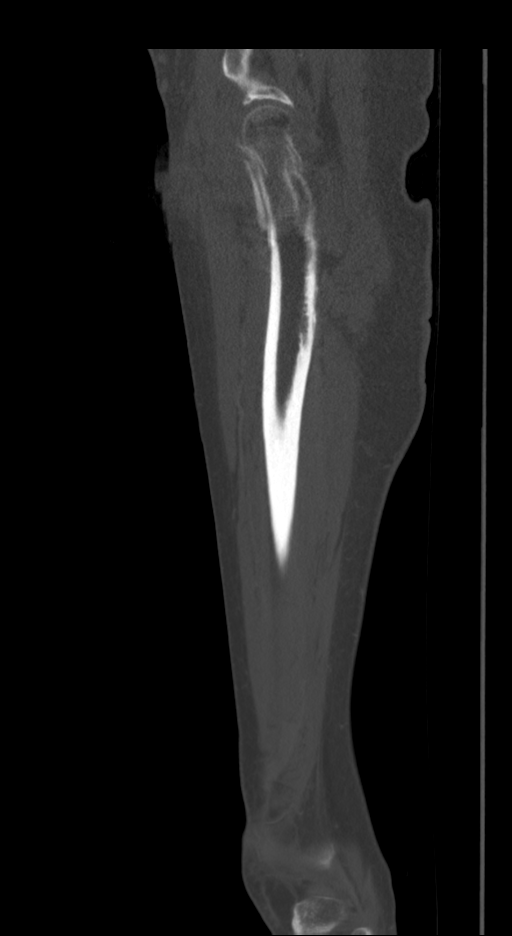
[im 74/130  bone]
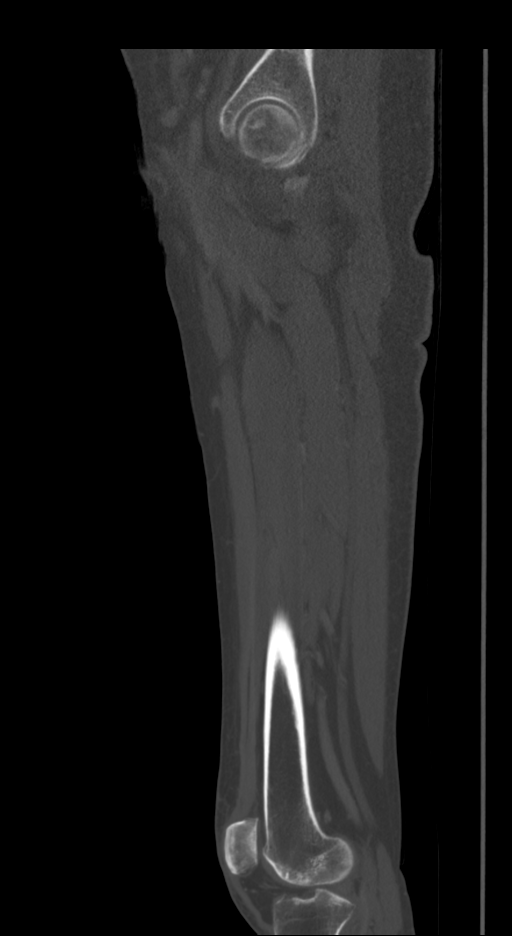
[im 93/130  bone]
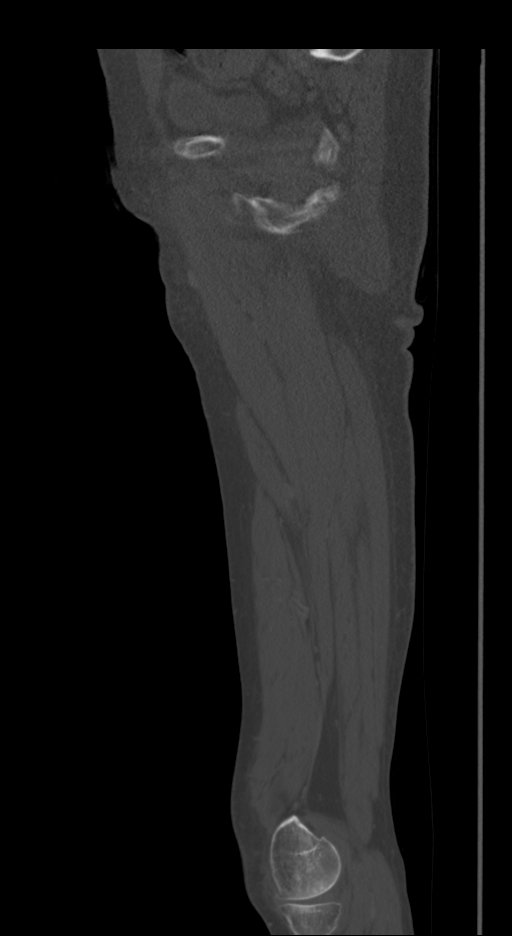

[11 of 33 positions shown; findings below may reference images not displayed]

FINDINGS: Bones/Joint/Cartilage

Normal.

Soft tissues

There is abnormal soft tissue stranding in the subcutaneous fat at
the medial aspect of the proximal left thigh superficial to the left
sartorius muscle. The soft tissue stranding extends around the
sartorius muscle but there is no abscess. No other abnormality.
IMPRESSION: Findings consistent with cellulitis of the inner aspect of the
proximal left thigh. Possible involvement of the adjacent sartorius
muscle consistent with focal myositis.

## 2018-05-14 IMAGING — DX DG CHEST 1V PORT
1 series · 1 of 1 positions shown · non-contrast
Comparison: Radiograph 03/08/2017

CLINICAL DATA: Weakness.  Altered mental status.

EXAM:
PORTABLE CHEST 1 VIEW

[chest ap]
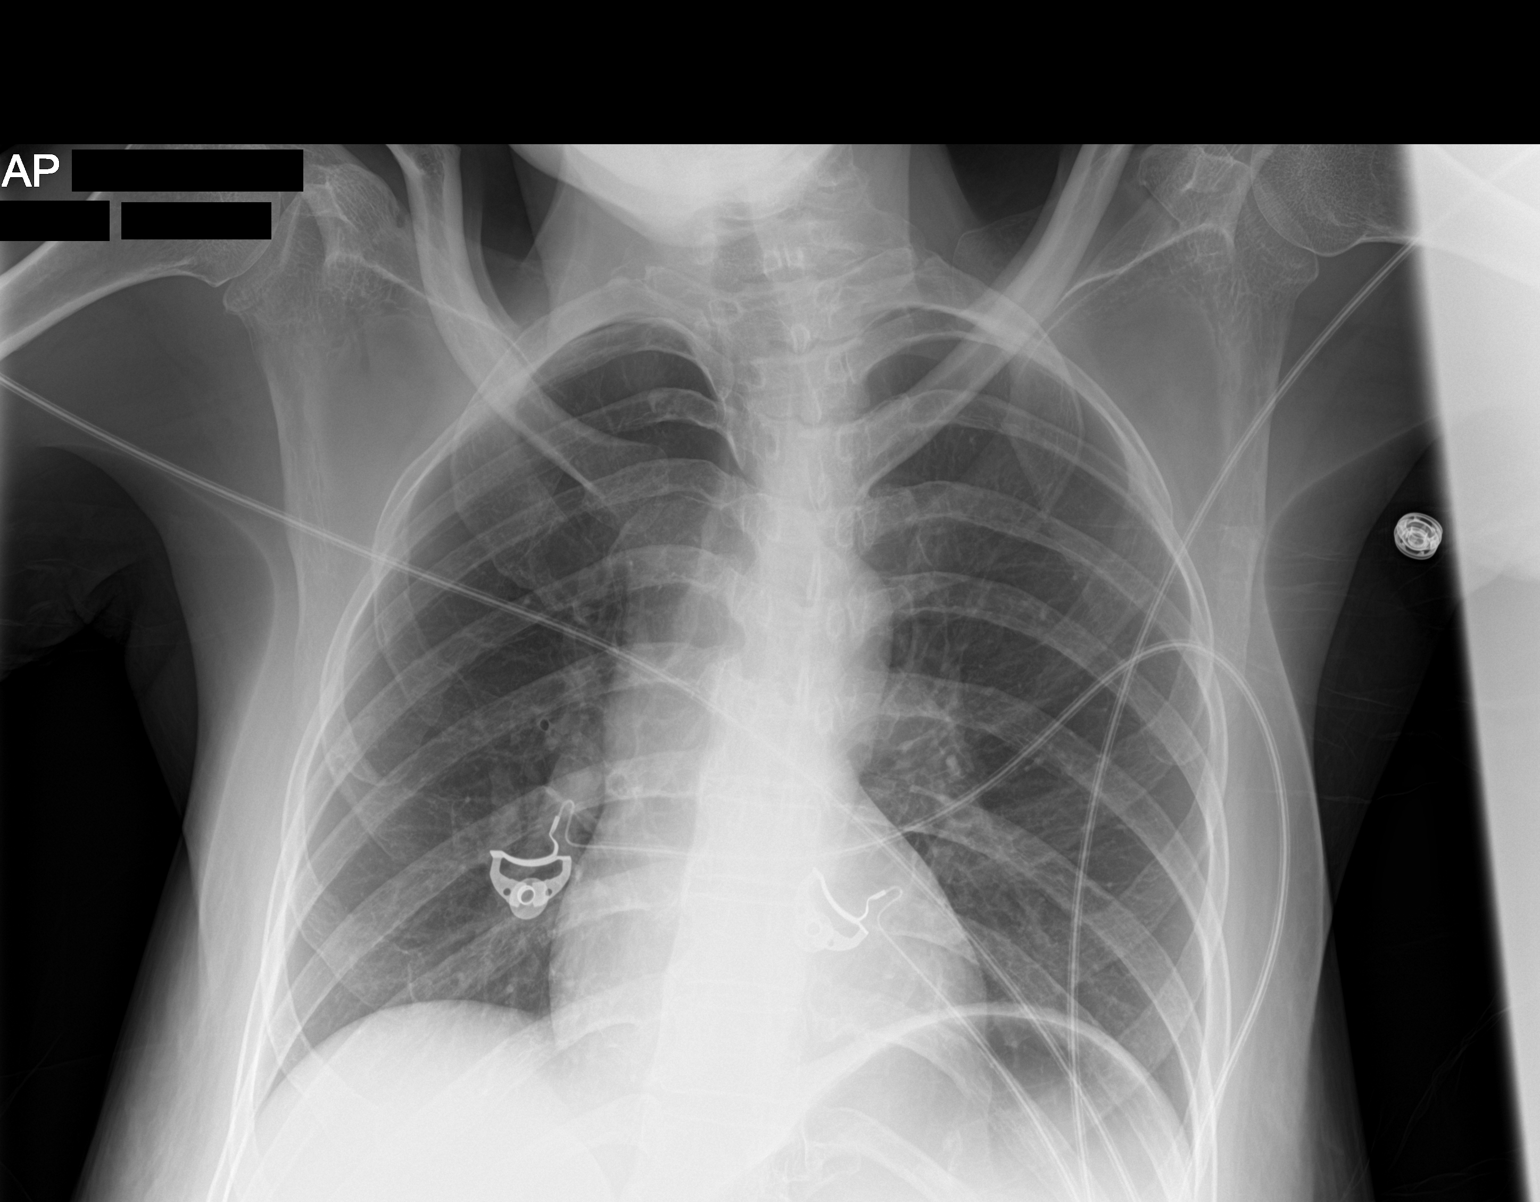

[1 of 1 positions shown; findings below may reference images not displayed]

FINDINGS: Mild rotation and anti lordotic positioning. The cardiomediastinal
contours are normal for technique. The lungs are clear. Pulmonary
vasculature is normal. No consolidation, pleural effusion, or
pneumothorax. No acute osseous abnormalities are seen.
IMPRESSION: No acute abnormality.

## 2018-05-16 IMAGING — DX DG CHEST 1V PORT
1 series · 1 of 1 positions shown · non-contrast
Comparison: 08/08/2017

CLINICAL DATA: Endotracheal tube placement post CPR.

EXAM:
PORTABLE CHEST 1 VIEW

[chest ap]
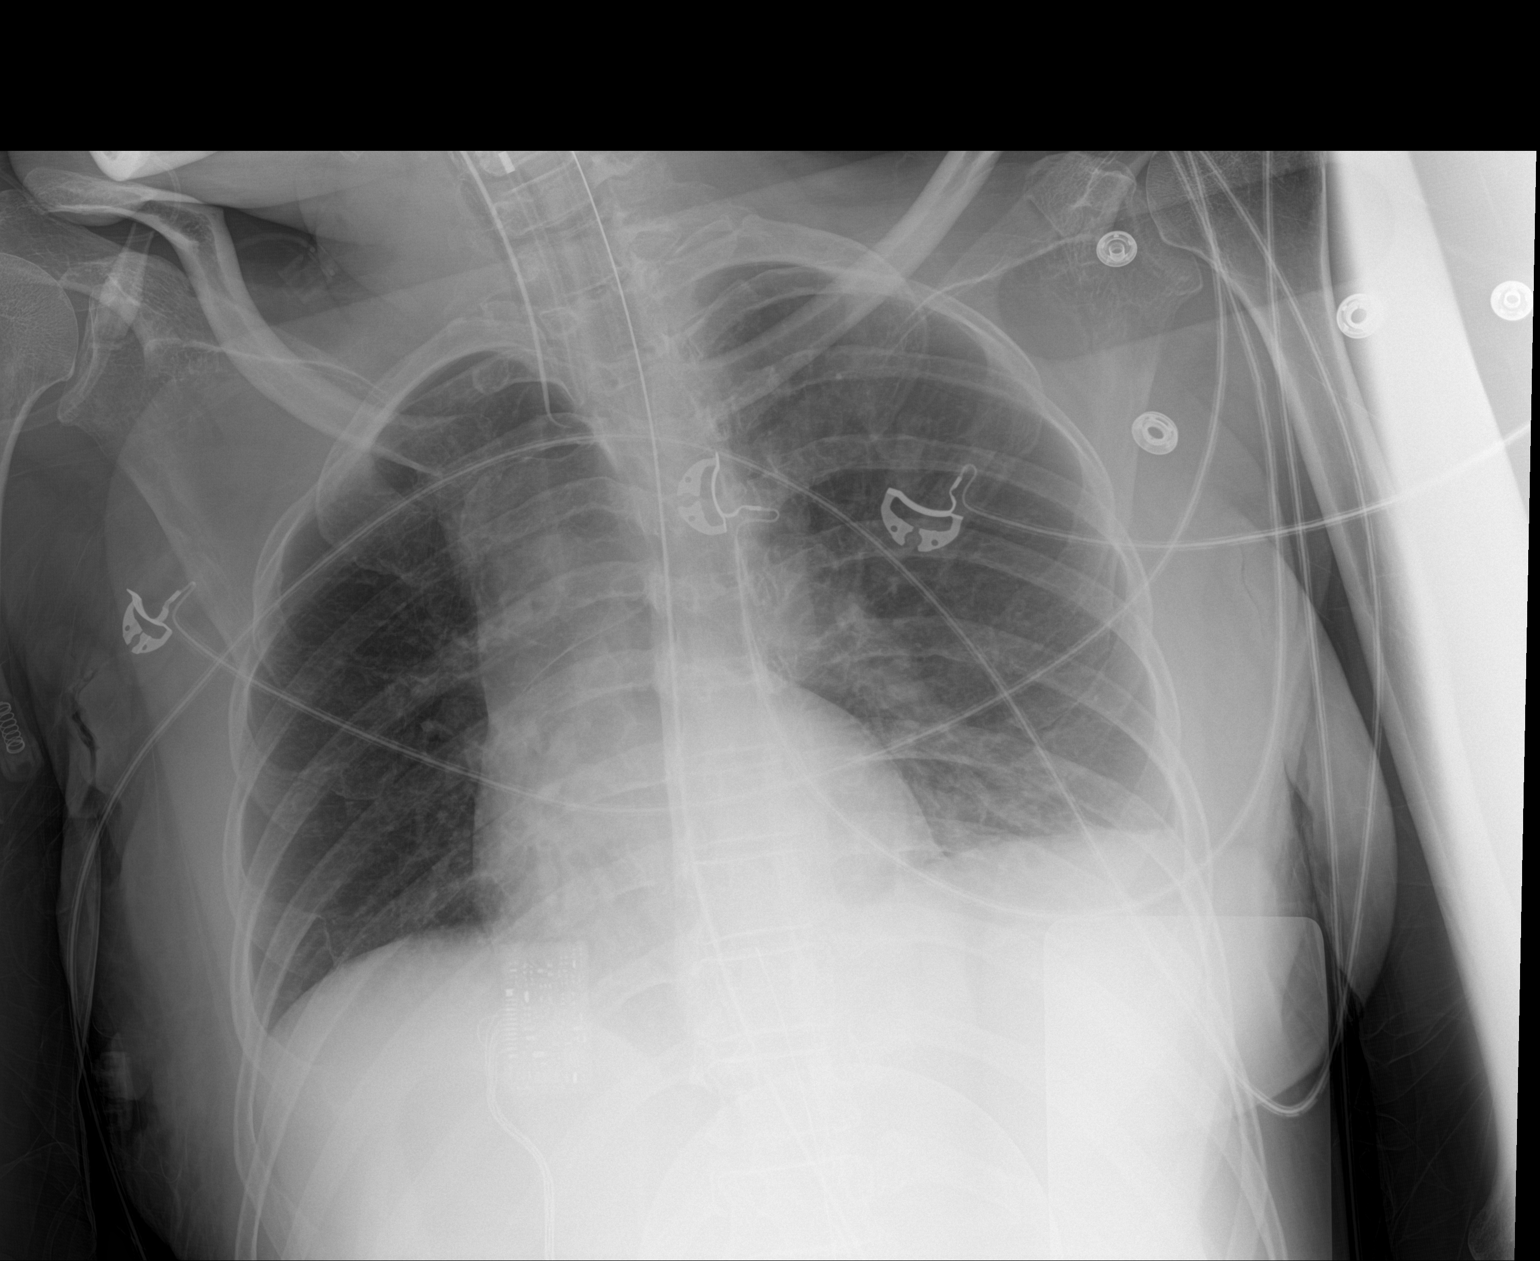

[1 of 1 positions shown; findings below may reference images not displayed]

FINDINGS: Interval placement of an endotracheal tube with tip measuring 4.6 cm
above the carina. An enteric tube is been placed. Tip is off the
field of view but below the left hemidiaphragm. Shallow inspiration
with linear atelectasis in the left lung base. Heart size and
pulmonary vascularity are normal. No pneumothorax. Mediastinal
contours appear intact although patient rotation limits examination.
IMPRESSION: Appliances appear in satisfactory location. Shallow inspiration with
atelectasis in the left lung base.
